# Patient Record
Sex: Female | Born: 1995 | Race: Black or African American | Hispanic: No | Marital: Single | Smoking: Current some day smoker
Health system: Southern US, Community
[De-identification: ages and names within clinical notes are randomized; demographics above are authoritative.]

## PROBLEM LIST (undated history)

## (undated) ENCOUNTER — Ambulatory Visit (HOSPITAL_COMMUNITY): Discharge status: Absent without leave | Payer: Medicaid Other

## (undated) DIAGNOSIS — T39395A Adverse effect of other nonsteroidal anti-inflammatory drugs [NSAID], initial encounter: Secondary | ICD-10-CM

## (undated) DIAGNOSIS — R569 Unspecified convulsions: Secondary | ICD-10-CM

## (undated) DIAGNOSIS — F419 Anxiety disorder, unspecified: Secondary | ICD-10-CM

## (undated) DIAGNOSIS — E119 Type 2 diabetes mellitus without complications: Secondary | ICD-10-CM

## (undated) DIAGNOSIS — N83202 Unspecified ovarian cyst, left side: Secondary | ICD-10-CM

## (undated) DIAGNOSIS — N83201 Unspecified ovarian cyst, right side: Secondary | ICD-10-CM

## (undated) DIAGNOSIS — K922 Gastrointestinal hemorrhage, unspecified: Secondary | ICD-10-CM

## (undated) DIAGNOSIS — F445 Conversion disorder with seizures or convulsions: Secondary | ICD-10-CM

## (undated) DIAGNOSIS — I219 Acute myocardial infarction, unspecified: Secondary | ICD-10-CM

## (undated) HISTORY — DX: Anxiety disorder, unspecified: F41.9

## (undated) HISTORY — PX: HEMORROIDECTOMY: SUR656

## (undated) NOTE — Telephone Encounter (Signed)
 Formatting of this note might be different from the original. Opened in error.  Electronically signed by Kaylee Dress, RN at 08/24/2021  2:54 PM EST

---

## 2014-06-09 ENCOUNTER — Emergency Department (HOSPITAL_COMMUNITY)
Admission: EM | Admit: 2014-06-09 | Discharge: 2014-06-10 | Disposition: A | Discharge status: Home / Self Care | Payer: Medicaid Other | Attending: Emergency Medicine | Admitting: Emergency Medicine

## 2014-06-09 ENCOUNTER — Encounter (HOSPITAL_COMMUNITY): Payer: Self-pay | Admitting: Emergency Medicine

## 2014-06-09 DIAGNOSIS — R55 Syncope and collapse: Secondary | ICD-10-CM | POA: Diagnosis not present

## 2014-06-09 DIAGNOSIS — Z79899 Other long term (current) drug therapy: Secondary | ICD-10-CM | POA: Insufficient documentation

## 2014-06-09 DIAGNOSIS — N730 Acute parametritis and pelvic cellulitis: Secondary | ICD-10-CM

## 2014-06-09 DIAGNOSIS — Z3202 Encounter for pregnancy test, result negative: Secondary | ICD-10-CM | POA: Insufficient documentation

## 2014-06-09 DIAGNOSIS — F17200 Nicotine dependence, unspecified, uncomplicated: Secondary | ICD-10-CM | POA: Diagnosis not present

## 2014-06-09 DIAGNOSIS — R109 Unspecified abdominal pain: Secondary | ICD-10-CM | POA: Diagnosis present

## 2014-06-09 DIAGNOSIS — R110 Nausea: Secondary | ICD-10-CM

## 2014-06-09 LAB — CBC WITH DIFFERENTIAL/PLATELET
Basophils Absolute: 0 10*3/uL (ref 0.0–0.1)
Basophils Relative: 0 % (ref 0–1)
Eosinophils Absolute: 0.1 10*3/uL (ref 0.0–0.7)
Eosinophils Relative: 2 % (ref 0–5)
HEMATOCRIT: 39.3 % (ref 36.0–46.0)
Hemoglobin: 12.2 g/dL (ref 12.0–15.0)
LYMPHS PCT: 41 % (ref 12–46)
Lymphs Abs: 3.3 10*3/uL (ref 0.7–4.0)
MCH: 22.7 pg — ABNORMAL LOW (ref 26.0–34.0)
MCHC: 31 g/dL (ref 30.0–36.0)
MCV: 73 fL — AB (ref 78.0–100.0)
Monocytes Absolute: 0.9 10*3/uL (ref 0.1–1.0)
Monocytes Relative: 11 % (ref 3–12)
NEUTROS ABS: 3.7 10*3/uL (ref 1.7–7.7)
Neutrophils Relative %: 46 % (ref 43–77)
Platelets: 311 10*3/uL (ref 150–400)
RBC: 5.38 MIL/uL — ABNORMAL HIGH (ref 3.87–5.11)
RDW: 15 % (ref 11.5–15.5)
WBC: 8 10*3/uL (ref 4.0–10.5)

## 2014-06-09 NOTE — ED Provider Notes (Signed)
CSN: [id number]     Arrival date & time 06/09/14  2143 History   First MD Initiated Contact with Patient 06/09/14 2226     Chief Complaint  Patient presents with  . Abdominal Pain  . Back Pain     (Consider location/radiation/quality/duration/timing/severity/associated sxs/prior Treatment) HPI Ashlee Thompson is a 18 y.o. female with no significant past medical history who comes in for evaluation of abdominal pain. Patient says for the past week she has had abdominal pain associated with nausea with no vomiting. She reports Advil the pain better and moving makes the pain worse. She characterizes the pain as "someone stabbing me with a screwdriver" and localizes it to her lower abdomen. She also reports she started her period today he reports this is similar to the pain she has had with menses before. No abdominal surgeries.  She denies being pregnant. She also reports passing out twice today once while standing in her dorm room and again while sitting in the chair shortly after she experienced her first episode. She denies any head trauma, no aura, no seizure-like activity. She went to the [facility] health care and they told her she was dehydrated. She reports diminished appetite. She denies fevers, chest pain, shortness of breath, numbness or weakness.  History reviewed. No pertinent past medical history. History reviewed. No pertinent past surgical history. Family History  Problem Relation Age of Onset  . CAD Other   . Cancer Other    History  Substance Use Topics  . Smoking status: Current Every Day Smoker    Types: Cigarettes  . Smokeless tobacco: Not on file  . Alcohol Use: Yes     Comment: rare   OB History   Grav Para Term Preterm Abortions TAB SAB Ect Mult Living                 Review of Systems  Constitutional: Negative for fever.  HENT: Negative for sore throat.   Eyes: Negative for visual disturbance.  Respiratory: Negative for shortness of breath.   Cardiovascular:  Negative for chest pain.  Gastrointestinal: Positive for nausea and abdominal pain.  Endocrine: Negative for polyuria.  Genitourinary: Negative for dysuria.  Skin: Negative for rash.  Neurological: Positive for syncope. Negative for headaches.      Allergies  Review of patient's allergies indicates no known allergies.  Home Medications   Prior to Admission medications   Medication Sig Start Date End Date Taking? Authorizing Provider  norethindrone-ethinyl estradiol-iron (ESTROSTEP FE,TILIA FE,TRI-LEGEST FE) 1-20/1-30/1-35 MG-MCG tablet Take 1 tablet by mouth daily.   Yes Historical Provider, MD  doxycycline (VIBRAMYCIN) 100 MG capsule Take 1 capsule (100 mg total) by mouth 2 (two) times daily. One po bid x 7 days 06/10/14   Earle Gell Keyly Baldonado, PA-C  ketorolac (TORADOL) 10 MG tablet Take 1 tablet (10 mg total) by mouth every 6 (six) hours as needed. 06/10/14   Sharlene Motts, PA-C  ondansetron (ZOFRAN ODT) 4 MG disintegrating tablet  ODT q4 hours prn nausea/vomit 06/10/14   Earle Gell Quana Chamberlain, PA-C   BP 142/91  Pulse 95  Temp(Src) 98.2 F (36.8 C) (Oral)  Resp 19  Ht  (1.651 m)  Wt 220 lb (99.791 kg)  BMI 36.61 kg/m2  SpO2 98%  LMP 06/09/2014 Physical Exam  Nursing note and vitals reviewed. Constitutional: She is oriented to person, place, and time. She appears well-developed and well-nourished.  HENT:  Head: Normocephalic and atraumatic.  Mouth/Throat: Oropharynx is clear and moist.  Eyes: Conjunctivae  are normal. Pupils are equal, round, and reactive to light. Right eye exhibits no discharge. Left eye exhibits no discharge. No scleral icterus.  Neck: Neck supple. No JVD present.  Cardiovascular: Normal rate, regular rhythm and normal heart sounds.   Pulmonary/Chest: Effort normal and breath sounds normal. No respiratory distress. She has no wheezes. She has no rales.  Abdominal: Soft. There is tenderness.  Diffuse tenderness to palpation of Lower abdomen. No  obvious lesions, rashes or masses.  Genitourinary:  Chaperone was present for the entire genital exam. No lesions or rashes appreciated on vulva. Cervix visualized on speculum exam and appropriate cultures sampled. Copious blood in vaginal vault.  Discharge not identifiable Upon bi manual exam-  TTP of the L adnexa with cervical motion tenderness. No fullness or masses appreciated. No abnormalities appreciated in structural anatomy.   Musculoskeletal: She exhibits no tenderness.  Neurological: She is alert and oriented to person, place, and time.  Cranial Nerves II-XII grossly intact  Skin: Skin is warm and dry. No rash noted.  Psychiatric: She has a normal mood and affect.    ED Course  Procedures (including critical care time) Labs Review Labs Reviewed  WET PREP, GENITAL - Abnormal; Notable for the following:    WBC, Wet Prep HPF POC FEW (*)    All other components within normal limits  CBC WITH DIFFERENTIAL - Abnormal; Notable for the following:    RBC 5.38 (*)    MCV 73.0 (*)    MCH 22.7 (*)    All other components within normal limits  COMPREHENSIVE METABOLIC PANEL - Abnormal; Notable for the following:    Total Bilirubin 0.2 (*)    All other components within normal limits  URINALYSIS, ROUTINE W REFLEX MICROSCOPIC - Abnormal; Notable for the following:    Hgb urine dipstick TRACE (*)    All other components within normal limits  URINE MICROSCOPIC-ADD ON - Abnormal; Notable for the following:    Squamous Epithelial / LPF FEW (*)    All other components within normal limits  GC/CHLAMYDIA PROBE AMP  PREGNANCY, URINE  RPR  HIV ANTIBODY (ROUTINE TESTING)    Imaging Review No results found.   EKG Interpretation   Date/Time:  Thursday June 10 2014 00:10:31 EDT Ventricular Rate:  91 PR Interval:  139 QRS Duration: 88 QT Interval:  372 QTC Calculation: 458 R Axis:   20 Text Interpretation:  Normal sinus rhythm Normal ECG No old tracing to  compare Confirmed  by GOLDSTON  MD, SCOTT ([id number]) on 06/10/2014 12:15:42 AM     Meds given in ED:  Medications  cefTRIAXone (ROCEPHIN) injection 250 mg (not administered)  ondansetron (ZOFRAN-ODT) disintegrating tablet 4 mg (4 mg Oral Given 06/10/14 0105)  ketorolac (TORADOL) 30 MG/ML injection 30 mg (30 mg Intramuscular Given 06/10/14 0110)    New Prescriptions   DOXYCYCLINE (VIBRAMYCIN) 100 MG CAPSULE    Take 1 capsule (100 mg total) by mouth 2 (two) times daily. One po bid x 7 days   KETOROLAC (TORADOL) 10 MG TABLET    Take 1 tablet (10 mg total) by mouth every 6 (six) hours as needed.   ONDANSETRON (ZOFRAN ODT) 4 MG DISINTEGRATING TABLET     ODT q4 hours prn nausea/vomit   Filed Vitals:   06/09/14 2209  BP: 142/91  Pulse: 95  Temp: 98.2 F (36.8 C)  TempSrc: Oral  Resp: 19  Height:  (1.651 m)  Weight: 220 lb (99.791 kg)  SpO2: 98%    MDM  Vitals stable - WNL -afebrile Pt resting comfortably in ED. PE and clinical picture not concerning for emergent pathology at this time. Due to unidentifiable cause for abd pain and presence of cervical motion tenderness, will trxt for PID.  Orthostatic vitals not concerning for autonomic dysfunction. EKG not concerning. Labwork noncontributory. No evidence of UTI. Trace Hgb likely due to menses. Pt stable, in good condition and is appropriate for discharge at this time.  Will DC with Doxycycline for PID, Zofran for Nausea and Toradol for abd pain. Discussed f/u with PCP and return precautions, pt very amenable to plan.  Prior to patient discharge, I discussed and reviewed this case with Dr.Iva Lynelle Doctor    Final diagnoses:  PID (acute pelvic inflammatory disease)  Nausea  Abdominal discomfort        Earle Gell Darlington, PA-C 06/10/14 [phone]

## 2014-06-09 NOTE — ED Notes (Signed)
Pt states she started having lower abd pain that started 2 days ago  Pt states the pain has progressively gotten worse and now she hurts in her lower back as well  Pt states she is also having headaches  Pt states today she has passed out twice  Pt denies any urinary symptoms, states her bowel movements are normal last one was this morning,  Pt states she is currently on her period started today

## 2014-06-09 NOTE — ED Notes (Signed)
PA at bedside.

## 2014-06-10 LAB — COMPREHENSIVE METABOLIC PANEL
ALT: 35 U/L (ref 0–35)
ANION GAP: 14 (ref 5–15)
AST: 32 U/L (ref 0–37)
Albumin: 3.5 g/dL (ref 3.5–5.2)
Alkaline Phosphatase: 88 U/L (ref 39–117)
BILIRUBIN TOTAL: 0.2 mg/dL — AB (ref 0.3–1.2)
BUN: 17 mg/dL (ref 6–23)
CHLORIDE: 101 meq/L (ref 96–112)
CO2: 23 meq/L (ref 19–32)
CREATININE: 0.8 mg/dL (ref 0.50–1.10)
Calcium: 9.5 mg/dL (ref 8.4–10.5)
GLUCOSE: 98 mg/dL (ref 70–99)
Potassium: 4.2 mEq/L (ref 3.7–5.3)
Sodium: 138 mEq/L (ref 137–147)
Total Protein: 7.8 g/dL (ref 6.0–8.3)

## 2014-06-10 LAB — GC/CHLAMYDIA PROBE AMP
CT Probe RNA: NEGATIVE
GC PROBE AMP APTIMA: NEGATIVE

## 2014-06-10 LAB — URINALYSIS, ROUTINE W REFLEX MICROSCOPIC
Bilirubin Urine: NEGATIVE
Glucose, UA: NEGATIVE mg/dL
KETONES UR: NEGATIVE mg/dL
LEUKOCYTES UA: NEGATIVE
Nitrite: NEGATIVE
PROTEIN: NEGATIVE mg/dL
Specific Gravity, Urine: 1.025 (ref 1.005–1.030)
UROBILINOGEN UA: 0.2 mg/dL (ref 0.0–1.0)
pH: 6 (ref 5.0–8.0)

## 2014-06-10 LAB — HIV ANTIBODY (ROUTINE TESTING W REFLEX): HIV 1&2 Ab, 4th Generation: NONREACTIVE

## 2014-06-10 LAB — URINE MICROSCOPIC-ADD ON

## 2014-06-10 LAB — WET PREP, GENITAL
CLUE CELLS WET PREP: NONE SEEN
Trich, Wet Prep: NONE SEEN
Yeast Wet Prep HPF POC: NONE SEEN

## 2014-06-10 LAB — PREGNANCY, URINE: Preg Test, Ur: NEGATIVE

## 2014-06-10 LAB — RPR

## 2014-06-10 MED ORDER — ONDANSETRON 4 MG PO TBDP: ORAL_TABLET | ORAL | Status: DC

## 2014-06-10 MED ORDER — CEFTRIAXONE SODIUM 250 MG IJ SOLR
250.0000 mg | Freq: Once | INTRAMUSCULAR | Status: AC
  Administered 2014-06-10: 250 mg via INTRAMUSCULAR
  Filled 2014-06-10: qty 250

## 2014-06-10 MED ORDER — KETOROLAC TROMETHAMINE 10 MG PO TABS: 10.0000 mg | ORAL_TABLET | Freq: Four times a day (QID) | ORAL | Status: DC | PRN

## 2014-06-10 MED ORDER — ONDANSETRON 4 MG PO TBDP
4.0000 mg | ORAL_TABLET | Freq: Once | ORAL | Status: AC
  Administered 2014-06-10: 4 mg via ORAL
  Filled 2014-06-10: qty 1

## 2014-06-10 MED ORDER — DOXYCYCLINE HYCLATE 100 MG PO CAPS: 100.0000 mg | ORAL_CAPSULE | Freq: Two times a day (BID) | ORAL | Status: DC

## 2014-06-10 MED ORDER — KETOROLAC TROMETHAMINE 30 MG/ML IJ SOLN
30.0000 mg | Freq: Once | INTRAMUSCULAR | Status: DC
  Filled 2014-06-10: qty 1

## 2014-06-10 MED ORDER — KETOROLAC TROMETHAMINE 30 MG/ML IJ SOLN
30.0000 mg | Freq: Once | INTRAMUSCULAR | Status: AC
  Administered 2014-06-10: 30 mg via INTRAMUSCULAR

## 2014-06-11 NOTE — ED Provider Notes (Signed)
Medical screening examination/treatment/procedure(s) were performed by non-physician practitioner and as supervising physician I was immediately available for consultation/collaboration.   EKG Interpretation   Date/Time:  Thursday June 10 2014 00:10:31 EDT Ventricular Rate:  91 PR Interval:  139 QRS Duration: 88 QT Interval:  372 QTC Calculation: 458 R Axis:   20 Text Interpretation:  Normal sinus rhythm Normal ECG No old tracing to  compare Confirmed by GOLDSTON  MD, SCOTT [id number]) on 06/10/2014 12:15:42 AM      Devoria Albe, MD, Franz Dell, MD 06/11/14 0030

## 2014-12-15 ENCOUNTER — Encounter (HOSPITAL_COMMUNITY): Payer: Self-pay | Admitting: Emergency Medicine

## 2014-12-15 ENCOUNTER — Emergency Department (HOSPITAL_COMMUNITY): Payer: Medicaid Other

## 2014-12-15 ENCOUNTER — Emergency Department (HOSPITAL_COMMUNITY)
Admission: EM | Admit: 2014-12-15 | Discharge: 2014-12-16 | Disposition: A | Discharge status: Home / Self Care | Payer: Medicaid Other | Attending: Emergency Medicine | Admitting: Emergency Medicine

## 2014-12-15 DIAGNOSIS — M7918 Myalgia, other site: Secondary | ICD-10-CM

## 2014-12-15 DIAGNOSIS — F419 Anxiety disorder, unspecified: Secondary | ICD-10-CM | POA: Insufficient documentation

## 2014-12-15 DIAGNOSIS — M791 Myalgia: Secondary | ICD-10-CM | POA: Insufficient documentation

## 2014-12-15 DIAGNOSIS — R51 Headache: Secondary | ICD-10-CM | POA: Diagnosis not present

## 2014-12-15 DIAGNOSIS — Z720 Tobacco use: Secondary | ICD-10-CM | POA: Diagnosis not present

## 2014-12-15 DIAGNOSIS — R0789 Other chest pain: Secondary | ICD-10-CM | POA: Diagnosis not present

## 2014-12-15 DIAGNOSIS — M25512 Pain in left shoulder: Secondary | ICD-10-CM | POA: Insufficient documentation

## 2014-12-15 DIAGNOSIS — R079 Chest pain, unspecified: Secondary | ICD-10-CM

## 2014-12-15 DIAGNOSIS — M79602 Pain in left arm: Secondary | ICD-10-CM

## 2014-12-15 DIAGNOSIS — M62838 Other muscle spasm: Secondary | ICD-10-CM

## 2014-12-15 DIAGNOSIS — F411 Generalized anxiety disorder: Secondary | ICD-10-CM

## 2014-12-15 LAB — CBC WITH DIFFERENTIAL/PLATELET
Basophils Absolute: 0 10*3/uL (ref 0.0–0.1)
Basophils Relative: 0 % (ref 0–1)
EOS PCT: 1 % (ref 0–5)
Eosinophils Absolute: 0.1 10*3/uL (ref 0.0–0.7)
HCT: 39.3 % (ref 36.0–46.0)
Hemoglobin: 12.2 g/dL (ref 12.0–15.0)
LYMPHS ABS: 3.5 10*3/uL (ref 0.7–4.0)
Lymphocytes Relative: 34 % (ref 12–46)
MCH: 22.6 pg — ABNORMAL LOW (ref 26.0–34.0)
MCHC: 31 g/dL (ref 30.0–36.0)
MCV: 72.9 fL — AB (ref 78.0–100.0)
MONO ABS: 0.8 10*3/uL (ref 0.1–1.0)
Monocytes Relative: 8 % (ref 3–12)
Neutro Abs: 5.7 10*3/uL (ref 1.7–7.7)
Neutrophils Relative %: 57 % (ref 43–77)
Platelets: 292 10*3/uL (ref 150–400)
RBC: 5.39 MIL/uL — AB (ref 3.87–5.11)
RDW: 15.2 % (ref 11.5–15.5)
WBC: 10.1 10*3/uL (ref 4.0–10.5)

## 2014-12-15 LAB — BASIC METABOLIC PANEL
ANION GAP: 9 (ref 5–15)
BUN: 20 mg/dL (ref 6–23)
CALCIUM: 9.4 mg/dL (ref 8.4–10.5)
CO2: 25 mmol/L (ref 19–32)
CREATININE: 0.58 mg/dL (ref 0.50–1.10)
Chloride: 103 mmol/L (ref 96–112)
GFR calc Af Amer: >90 mL/min (ref >90)
GFR calc non Af Amer: >90 mL/min (ref >90)
Glucose, Bld: 91 mg/dL (ref 70–99)
Potassium: 3.7 mmol/L (ref 3.5–5.1)
Sodium: 137 mmol/L (ref 135–145)

## 2014-12-15 LAB — I-STAT TROPONIN, ED: TROPONIN I, POC: 0 ng/mL (ref 0.00–0.08)

## 2014-12-15 MED ORDER — ASPIRIN 325 MG PO TABS
325.0000 mg | ORAL_TABLET | Freq: Once | ORAL | Status: AC
Start: 2014-12-15 — End: 2014-12-15
  Administered 2014-12-15: 325 mg via ORAL
  Filled 2014-12-15: qty 1

## 2014-12-15 MED ORDER — LORAZEPAM 2 MG/ML IJ SOLN
1.0000 mg | Freq: Once | INTRAMUSCULAR | Status: AC
  Administered 2014-12-15: 1 mg via INTRAVENOUS
  Filled 2014-12-15: qty 1

## 2014-12-15 NOTE — ED Notes (Signed)
Bed: [id number] Expected date:  Expected time:  Means of arrival:  Comments: EMS 19 yo female with chest pain and hyperventilating

## 2014-12-15 NOTE — ED Notes (Signed)
Pt c/o panic attack while at work. Pt states she got birth control shot and is having left arm pain.

## 2014-12-15 NOTE — ED Provider Notes (Signed)
CSN: [id number]     Arrival date & time 12/15/14  1955 History   First MD Initiated Contact with Patient 12/15/14 2117     Chief Complaint  Patient presents with  . Panic Attack  . Arm Pain     (Consider location/radiation/quality/duration/timing/severity/associated sxs/prior Treatment) HPI Comments: Ashlee Thompson is a 19 y.o. female who presents to the ED with complaints of panic attack and left arm pain that began around 6:30 PM while she was at work at rest. She reports that yesterday she had a shot in her arm, and that today at approximately 6:30 she developed 10/10 central chest tightness that seems to be radiating from the left shoulder and neck area, coming intermittently, worse with movement, with no medications tried prior to arrival. She states this feels very much like her prior anxiety attacks. She reports that she has some left arm tingling but denies any numbness or weakness, states that it hurts to move the arm therefore she refuses to move it but denies that it is weak. States she did initially feel short of breath when she was anxious and hyperventilating, but now she feels improved. She denies any fevers, cough, wheezing, hemoptysis, diaphoresis, lightheadedness, syncope, dizziness, leg swelling, recent travel/surgery/immobilization, OCP use, dysphagia, claudication, PND, orthopnea, heartburn, abdominal pain, nausea, vomiting, dysuria, hematuria, vaginal bleeding or discharge, numbness, weakness, rashes, neck stiffness, or vision changes. She endorses that she has a migraine headache which is the same as her typical migraines and has developed after she had a panic attack.  Patient is a 19 y.o. female presenting with arm pain. The history is provided by the patient. No language interpreter was used.  Arm Pain This is a new problem. The current episode started today. The problem occurs constantly. The problem has been unchanged. Associated symptoms include chest pain (from L  shoulder), headaches (usual migraine), myalgias (L shoulder/arm) and neck pain (from L shoulder). Pertinent negatives include no abdominal pain, arthralgias, chills, coughing, diaphoresis, fever, joint swelling, nausea, numbness, rash, visual change, vomiting or weakness. Exacerbated by: movement. She has tried nothing for the symptoms. The treatment provided no relief.    History reviewed. No pertinent past medical history. History reviewed. No pertinent past surgical history. Family History  Problem Relation Age of Onset  . CAD Other   . Cancer Other    History  Substance Use Topics  . Smoking status: Current Every Day Smoker    Types: Cigarettes  . Smokeless tobacco: Not on file  . Alcohol Use: Yes     Comment: rare   OB History    No data available     Review of Systems  Constitutional: Negative for fever, chills and diaphoresis.  Eyes: Negative for visual disturbance.  Respiratory: Positive for shortness of breath (now resolved). Negative for cough and wheezing.   Cardiovascular: Positive for chest pain (from L shoulder).  Gastrointestinal: Negative for nausea, vomiting, abdominal pain, diarrhea and constipation.  Genitourinary: Negative for dysuria, hematuria, flank pain, vaginal bleeding and vaginal discharge.  Musculoskeletal: Positive for myalgias (L shoulder/arm) and neck pain (from L shoulder). Negative for back pain, joint swelling, arthralgias and neck stiffness.  Skin: Negative for color change and rash.  Allergic/Immunologic: Negative for immunocompromised state.  Neurological: Positive for headaches (usual migraine). Negative for dizziness, syncope, weakness, light-headedness and numbness.  Psychiatric/Behavioral: Negative for confusion. The patient is nervous/anxious (anxiety attack).    10 Systems reviewed and are negative for acute change except as noted in the HPI.  Allergies  Review of patient's allergies indicates no known allergies.  Home Medications     Prior to Admission medications   Medication Sig Start Date End Date Taking? Authorizing Provider  MedroxyPROGESTERone Acetate (DEPO-PROVERA IM) Inject into the muscle every 3 (three) months.   Yes Historical Provider, MD  ondansetron (ZOFRAN ODT) 4 MG disintegrating tablet  ODT q4 hours prn nausea/vomit 06/10/14  Yes Joycie Peek, PA-C  doxycycline (VIBRAMYCIN) 100 MG capsule Take 1 capsule (100 mg total) by mouth 2 (two) times daily. One po bid x 7 days Patient not taking: Reported on 12/15/2014 06/10/14   Joycie Peek, PA-C  ketorolac (TORADOL) 10 MG tablet Take 1 tablet (10 mg total) by mouth every 6 (six) hours as needed. Patient not taking: Reported on 12/15/2014 06/10/14   Joycie Peek, PA-C   BP 119/77 mmHg  Pulse 96  Temp(Src) 99 F (37.2 C) (Rectal)  Resp 12  SpO2 100% Physical Exam  Constitutional: She is oriented to person, place, and time. Vital signs are normal. She appears well-developed and well-nourished.  Non-toxic appearance. No distress.  Afebrile, nontoxic, laying in bed calmly but gets distressed when asked to move, hyperventilates, but then calms down when coached  HENT:  Head: Normocephalic and atraumatic.  Mouth/Throat: Oropharynx is clear and moist and mucous membranes are normal.  Eyes: Conjunctivae and EOM are normal. Pupils are equal, round, and reactive to light. Right eye exhibits no discharge. Left eye exhibits no discharge.  PERRL, EOMI  Neck: Normal range of motion. Neck supple. No JVD present.  No meningismus or JVD  Cardiovascular: Normal rate, regular rhythm, normal heart sounds and intact distal pulses.  Exam reveals no gallop and no friction rub.   No murmur heard. RRR, nl s1/s2, no m/r/g, distal pulses intact, no pedal edema   Pulmonary/Chest: Effort normal and breath sounds normal. No respiratory distress. She has no decreased breath sounds. She has no wheezes. She has no rhonchi. She has no rales. She exhibits tenderness. She exhibits  no crepitus, no deformity and no retraction.    CTAB in all lung fields, no w/r/r, no hypoxia or increased WOB, speaking in full sentences, SpO2 100% on RA Chest wall TTP diffusely over L side extending into L deltoid/arm, no crepitus/deformity/retraction  Abdominal: Soft. Normal appearance and bowel sounds are normal. She exhibits no distension. There is no tenderness. There is no rigidity, no rebound, no guarding, no CVA tenderness, no tenderness at McBurney's point and negative Murphy's sign.  Musculoskeletal: Normal range of motion.       Left shoulder: She exhibits tenderness and spasm. She exhibits normal range of motion, no bony tenderness, no swelling, no effusion, no crepitus, no deformity, normal pulse and normal strength.       Left upper arm: She exhibits tenderness. She exhibits no bony tenderness, no swelling, no edema and no deformity.       Arms: L shoulder with diffuse TTP in deltoid muscle, mild spasm, no swelling/effusion, no crepitus or deformity, no focal bony TTP, FROM intact, strength and sensation grossly intact, distal pulses intact. Pt refuses to cooperate with full shoulder exam. No erythema or warmth.  Neurological: She is alert and oriented to person, place, and time. She has normal strength. No cranial nerve deficit or sensory deficit. GCS eye subscore is 4. GCS verbal subscore is 5. GCS motor subscore is 6.  CN 2-12 grossly intact A&O x4 GCS 15 Sensation and strength intact  Skin: Skin is warm, dry and intact. No rash  noted.  Psychiatric: Her mood appears anxious.  Anxious when asked to move L arm, but calms down when encouraged to take deep breaths  Nursing note and vitals reviewed.   ED Course  Procedures (including critical care time) Labs Review Labs Reviewed  CBC WITH DIFFERENTIAL/PLATELET - Abnormal; Notable for the following:    RBC 5.39 (*)    MCV 72.9 (*)    MCH 22.6 (*)    All other components within normal limits  BASIC METABOLIC PANEL  I-STAT  TROPOININ, ED  I-STAT TROPOININ, ED    Imaging Review Dg Chest 2 View  12/15/2014   CLINICAL DATA:  Acute onset of left-sided chest pain, shortness of breath, productive cough, headache and nasal congestion. Initial encounter.  EXAM: CHEST  2 VIEW  COMPARISON:  None.  FINDINGS: The lungs are well-aerated and clear. There is no evidence of focal opacification, pleural effusion or pneumothorax.  The heart is normal in size; the mediastinal contour is within normal limits. No acute osseous abnormalities are seen.  IMPRESSION: No acute cardiopulmonary process seen.   Electronically Signed   By: Roanna Raider M.D.   On: 12/15/2014 22:17     EKG Interpretation   Date/Time:  Wednesday December 15 2014 20:19:36 EDT Ventricular Rate:  97 PR Interval:  145 QRS Duration: 89 QT Interval:  341 QTC Calculation: 433 R Axis:   41 Text Interpretation:  Sinus rhythm No significant change was found  Confirmed by [facility]  MD, TREY ([id number]) on 12/15/2014 11:50:31 PM      MDM   Final diagnoses:  Chest pain  Atypical chest pain  Pain of left deltoid  Musculoskeletal arm pain, left  Musculoskeletal chest pain  Anxiety state  Muscle spasm of left shoulder area    19 y.o. female here with anxiety/panic attack that occurred at work. States this is the same as her prior panic attacks. States she has CP intermittently but upon questioning seems that it's actually L shoulder pain from her injection yesterday that radiates into her precordial area. All pain is reproducible. Sensation and strength intact in all extremities, distal pulses equal in all extremities. Doubt ACS and pt with few risk factors, but will give ASA which may help with musculoskeletal pain, and obtain labs/EKG/CXR. Will also give ativan as I believe this is largely due to anxiety. VS stable upon arrival and at this time. Will reassess after.   11:51 PM Trop neg at 4hr mark. CBC w/diff unremarkabel. BMP unremarkable. CXR WNL. EKG unremarkable.  Pain improved after ASA and ativan, moving arm more freely. Likely musculoskeletal etiology, but will get repeat trop at 6hr mark from onset of symptoms, and then likely discharge home with small supply of valium and naprosyn. Discussed f/up with campus clinic in 3-4 days to ensure resolution of symptoms.   1:14 AM Lab draw has not yet been done, asked 4 times for this to be obtained. Care signed over to Earley Favor NP who will follow up with repeat trop, as long as it's neg she will be discharged home with previously discussed plan.   BP 119/77 mmHg  Pulse 96  Temp(Src) 99 F (37.2 C) (Rectal)  Resp 12  SpO2 100%  Meds ordered this encounter  Medications  . aspirin tablet 325 mg    Sig:   . LORazepam (ATIVAN) injection 1 mg    Sig:   . diazepam (VALIUM) 5 MG tablet    Sig: Take 0.5 tablets (2.5 mg total) by mouth every 6 (  six) hours as needed for anxiety or muscle spasms.    Dispense:  6 tablet    Refill:  0    Order Specific Question:  Supervising Provider    Answer:  MILLER, BRIAN [[id number]]  . naproxen (NAPROSYN) 500 MG tablet    Sig: Take 1 tablet (500 mg total) by mouth 2 (two) times daily as needed for mild pain, moderate pain or headache (TAKE WITH MEALS.).    Dispense:  20 tablet    Refill:  0    Order Specific Question:  Supervising Provider    Answer:  Eber Hong [[id number]]     Travaughn Vue Camprubi-Soms, PA-C 12/16/14 [id number]  Blake Divine, MD 12/16/14 1313

## 2014-12-15 NOTE — ED Notes (Signed)
Pt reports she is unable to move left arm.

## 2014-12-16 LAB — I-STAT TROPONIN, ED: TROPONIN I, POC: 0 ng/mL (ref 0.00–0.08)

## 2014-12-16 MED ORDER — NAPROXEN 500 MG PO TABS: 500.0000 mg | ORAL_TABLET | Freq: Two times a day (BID) | ORAL | Status: DC | PRN

## 2014-12-16 MED ORDER — DIAZEPAM 5 MG PO TABS: 2.5000 mg | ORAL_TABLET | Freq: Four times a day (QID) | ORAL | Status: DC | PRN

## 2014-12-16 NOTE — Discharge Instructions (Signed)
Your pain is likely from the injection you received in your arm. Take naprosyn as directed for inflammation and pain with tylenol for breakthrough pain and valium for muscle relaxation and anxiety. Do not drive or operate machinery or consume alcohol with valium use. Use heat over the areas of soreness, no more than 20 minutes at a time every hour. Stay well hydrated. Follow up with your campus clinic in 3-4 days for recheck of ongoing symptoms. Return to ER for emergent changing or worsening of symptoms.     Musculoskeletal Pain Musculoskeletal pain is muscle and boney aches and pains. These pains can occur in any part of the body. Your caregiver may treat you without knowing the cause of the pain. They may treat you if blood or urine tests, X-rays, and other tests were normal.  CAUSES There is often not a definite cause or reason for these pains. These pains may be caused by a type of germ (virus). The discomfort may also come from overuse. Overuse includes working out too hard when your body is not fit. Boney aches also come from weather changes. Bone is sensitive to atmospheric pressure changes. HOME CARE INSTRUCTIONS   Ask when your test results will be ready. Make sure you get your test results.  Only take over-the-counter or prescription medicines for pain, discomfort, or fever as directed by your caregiver. If you were given medications for your condition, do not drive, operate machinery or power tools, or sign legal documents for 24 hours. Do not drink alcohol. Do not take sleeping pills or other medications that may interfere with treatment.  Continue all activities unless the activities cause more pain. When the pain lessens, slowly resume normal activities. Gradually increase the intensity and duration of the activities or exercise.  During periods of severe pain, bed rest may be helpful. Lay or sit in any position that is comfortable.  Putting ice on the injured area.  Put ice in a  bag.  Place a towel between your skin and the bag.  Leave the ice on for 15 to 20 minutes, 3 to 4 times a day.  Follow up with your caregiver for continued problems and no reason can be found for the pain. If the pain becomes worse or does not go away, it may be necessary to repeat tests or do additional testing. Your caregiver may need to look further for a possible cause. SEEK IMMEDIATE MEDICAL CARE IF:  You have pain that is getting worse and is not relieved by medications.  You develop chest pain that is associated with shortness or breath, sweating, feeling sick to your stomach (nauseous), or throw up (vomit).  Your pain becomes localized to the abdomen.  You develop any new symptoms that seem different or that concern you. MAKE SURE YOU:   Understand these instructions.  Will watch your condition.  Will get help right away if you are not doing well or get worse. Document Released: 09/03/2005 Document Revised: 11/26/2011 Document Reviewed: 05/08/2013 [facility] Patient Information 2015 [place], [place]. This information is not intended to replace advice given to you by your health care provider. Make sure you discuss any questions you have with your health care provider.  Chest Wall Pain Chest wall pain is pain felt in or around the chest bones and muscles. It may take up to 6 weeks to get better. It may take longer if you are active. Chest wall pain can happen on its own. Other times, things like germs, injury, coughing,  or exercise can cause the pain. HOME CARE   Avoid activities that make you tired or cause pain. Try not to use your chest, belly (abdominal), or side muscles. Do not use heavy weights.  Put ice on the sore area.  Put ice in a plastic bag.  Place a towel between your skin and the bag.  Leave the ice on for 15-20 minutes for the first 2 days.  Only take medicine as told by your doctor. GET HELP RIGHT AWAY IF:   You have more pain or are very  uncomfortable.  You have a fever.  Your chest pain gets worse.  You have new problems.  You feel sick to your stomach (nauseous) or throw up (vomit).  You start to sweat or feel lightheaded.  You have a cough with mucus (phlegm).  You cough up blood. MAKE SURE YOU:   Understand these instructions.  Will watch your condition.  Will get help right away if you are not doing well or get worse. Document Released: 02/20/2008 Document Revised: 11/26/2011 Document Reviewed: 04/30/2011 [facility] Patient Information 2015 [place]. This information is not intended to replace advice given to you by your health care provider. Make sure you discuss any questions you have with your health care provider.  Muscle Cramps and Spasms Muscle cramps and spasms occur when a muscle or muscles tighten and you have no control over this tightening (involuntary muscle contraction). They are a common problem and can develop in any muscle. The most common place is in the calf muscles of the leg. Both muscle cramps and muscle spasms are involuntary muscle contractions, but they also have differences:   Muscle cramps are sporadic and painful. They may last a few seconds to a quarter of an hour. Muscle cramps are often more forceful and last longer than muscle spasms.  Muscle spasms may or may not be painful. They may also last just a few seconds or much longer. CAUSES  It is uncommon for cramps or spasms to be due to a serious underlying problem. In many cases, the cause of cramps or spasms is unknown. Some common causes are:   Overexertion.   Overuse from repetitive motions (doing the same thing over and over).   Remaining in a certain position for a long period of time.   Improper preparation, form, or technique while performing a sport or activity.   Dehydration.   Injury.   Side effects of some medicines.   Abnormally low levels of the salts and ions in your blood (electrolytes),  especially potassium and calcium. This could happen if you are taking water pills (diuretics) or you are pregnant.  Some underlying medical problems can make it more likely to develop cramps or spasms. These include, but are not limited to:   Diabetes.   Parkinson disease.   Hormone disorders, such as thyroid problems.   Alcohol abuse.   Diseases specific to muscles, joints, and bones.   Blood vessel disease where not enough blood is getting to the muscles.  HOME CARE INSTRUCTIONS   Stay well hydrated. Drink enough water and fluids to keep your urine clear or pale yellow.  It may be helpful to massage, stretch, and relax the affected muscle.  For tight or tense muscles, use a warm towel, heating pad, or hot shower water directed to the affected area.  If you are sore or have pain after a cramp or spasm, applying ice to the affected area may relieve discomfort.  Put ice  in a plastic bag.  Place a towel between your skin and the bag.  Leave the ice on for 15-20 minutes, 03-04 times a day.  Medicines used to treat a known cause of cramps or spasms may help reduce their frequency or severity. Only take over-the-counter or prescription medicines as directed by your caregiver. SEEK MEDICAL CARE IF:  Your cramps or spasms get more severe, more frequent, or do not improve over time.  MAKE SURE YOU:   Understand these instructions.  Will watch your condition.  Will get help right away if you are not doing well or get worse. Document Released: 02/23/2002 Document Revised: 12/29/2012 Document Reviewed: 08/20/2012 [facility] Patient Information 2015 [place], [place]. This information is not intended to replace advice given to you by your health care provider. Make sure you discuss any questions you have with your health care provider.  Heat Therapy Heat therapy can help make painful, stiff muscles and joints feel better. Do not use heat on new injuries. Wait at least 48 hours  after an injury to use heat. Do not use heat when you have aches or pains right after an activity. If you still have pain 3 hours after stopping the activity, then you may use heat. HOME CARE Wet heat pack  Soak a clean towel in warm water. Squeeze out the extra water.  Put the warm, wet towel in a plastic bag.  Place a thin, dry towel between your skin and the bag.  Put the heat pack on the area for 5 minutes, and check your skin. Your skin may be pink, but it should not be red.  Leave the heat pack on the area for 15 to 30 minutes.  Repeat this every 2 to 4 hours while awake. Do not use heat while you are sleeping. Warm water bath  Fill a tub with warm water.  Place the affected body part in the tub.  Soak the area for 20 to 40 minutes.  Repeat as needed. Hot water bottle  Fill the water bottle half full with hot water.  Press out the extra air. Close the cap tightly.  Place a dry towel between your skin and the bottle.  Put the bottle on the area for 5 minutes, and check your skin. Your skin may be pink, but it should not be red.  Leave the bottle on the area for 15 to 30 minutes.  Repeat this every 2 to 4 hours while awake. Electric heating pad  Place a dry towel between your skin and the heating pad.  Set the heating pad on low heat.  Put the heating pad on the area for 10 minutes, and check your skin. Your skin may be pink, but it should not be red.  Leave the heating pad on the area for 20 to 40 minutes.  Repeat this every 2 to 4 hours while awake.  Do not lie on the heating pad.  Do not fall asleep while using the heating pad.  Do not use the heating pad near water. GET HELP RIGHT AWAY IF:  You get blisters or red skin.  Your skin is puffy (swollen), or you lose feeling (numbness) in the affected area.  You have any new problems.  Your problems are getting worse.  You have any questions or concerns. If you have any problems, stop using heat  therapy until you see your doctor. MAKE SURE YOU:  Understand these instructions.  Will watch your condition.  Will get help right  away if you are not doing well or get worse. Document Released: 11/26/2011 Document Reviewed: 10/27/2013 [facility] Patient Information 2015 [place], [place]. This information is not intended to replace advice given to you by your health care provider. Make sure you discuss any questions you have with your health care provider.  Generalized Anxiety Disorder Generalized anxiety disorder (GAD) is a mental disorder. It interferes with life functions, including relationships, work, and school. GAD is different from normal anxiety, which everyone experiences at some point in their lives in response to specific life events and activities. Normal anxiety actually helps [place] prepare for and get through these life events and activities. Normal anxiety goes away after the event or activity is over.  GAD causes anxiety that is not necessarily related to specific events or activities. It also causes excess anxiety in proportion to specific events or activities. The anxiety associated with GAD is also difficult to control. GAD can vary from mild to severe. People with severe GAD can have intense waves of anxiety with physical symptoms (panic attacks).  SYMPTOMS The anxiety and worry associated with GAD are difficult to control. This anxiety and worry are related to many life events and activities and also occur more days than not for 6 months or longer. People with GAD also have three or more of the following symptoms (one or more in children):  Restlessness.   Fatigue.  Difficulty concentrating.   Irritability.  Muscle tension.  Difficulty sleeping or unsatisfying sleep. DIAGNOSIS GAD is diagnosed through an assessment by your health care provider. Your health care provider will ask you questions aboutyour mood,physical symptoms, and events in your life. Your health care  provider may ask you about your medical history and use of alcohol or drugs, including prescription medicines. Your health care provider may also do a physical exam and blood tests. Certain medical conditions and the use of certain substances can cause symptoms similar to those associated with GAD. Your health care provider may refer you to a mental health specialist for further evaluation. TREATMENT The following therapies are usually used to treat GAD:   Medication. Antidepressant medication usually is prescribed for long-term daily control. Antianxiety medicines may be added in severe cases, especially when panic attacks occur.   Talk therapy (psychotherapy). Certain types of talk therapy can be helpful in treating GAD by providing support, education, and guidance. A form of talk therapy called cognitive behavioral therapy can teach you healthy ways to think about and react to daily life events and activities.  Stress managementtechniques. These include yoga, meditation, and exercise and can be very helpful when they are practiced regularly. A mental health specialist can help determine which treatment is best for you. Some people see improvement with one therapy. However, other people require a combination of therapies. Document Released: 12/29/2012 Document Revised: 01/18/2014 Document Reviewed: 12/29/2012 [facility] Patient Information 2015 [place], [place]. This information is not intended to replace advice given to you by your health care provider. Make sure you discuss any questions you have with your health care provider.

## 2015-04-07 ENCOUNTER — Ambulatory Visit (INDEPENDENT_AMBULATORY_CARE_PROVIDER_SITE_OTHER): Payer: BLUE CROSS/BLUE SHIELD

## 2015-04-07 ENCOUNTER — Ambulatory Visit (INDEPENDENT_AMBULATORY_CARE_PROVIDER_SITE_OTHER): Payer: BLUE CROSS/BLUE SHIELD | Admitting: Family Medicine

## 2015-04-07 VITALS — BP 118/74 | HR 107 | Temp 98.3°F | Resp 16 | Ht 65.5 in | Wt 283.0 lb

## 2015-04-07 DIAGNOSIS — M2550 Pain in unspecified joint: Secondary | ICD-10-CM

## 2015-04-07 DIAGNOSIS — M791 Myalgia, unspecified site: Secondary | ICD-10-CM

## 2015-04-07 DIAGNOSIS — M436 Torticollis: Secondary | ICD-10-CM

## 2015-04-07 DIAGNOSIS — M62838 Other muscle spasm: Secondary | ICD-10-CM

## 2015-04-07 DIAGNOSIS — M6249 Contracture of muscle, multiple sites: Secondary | ICD-10-CM

## 2015-04-07 LAB — POCT CBC
Granulocyte percent: 61.4 %G (ref 37–80)
HCT, POC: 39.6 % (ref 37.7–47.9)
Hemoglobin: 12.3 g/dL (ref 12.2–16.2)
LYMPH, POC: 3.3 (ref 0.6–3.4)
MCH, POC: 22.5 pg — AB (ref 27–31.2)
MCHC: 31.1 g/dL — AB (ref 31.8–35.4)
MCV: 72.3 fL — AB (ref 80–97)
MID (CBC): 0.6 (ref 0–0.9)
MPV: 7.3 fL (ref 0–99.8)
PLATELET COUNT, POC: 309 10*3/uL (ref 142–424)
POC Granulocyte: 6.3 (ref 2–6.9)
POC LYMPH PERCENT: 32.3 %L (ref 10–50)
POC MID %: 6.3 %M (ref 0–12)
RBC: 5.48 M/uL (ref 4.04–5.48)
RDW, POC: 17.4 %
WBC: 10.3 10*3/uL — AB (ref 4.6–10.2)

## 2015-04-07 LAB — COMPREHENSIVE METABOLIC PANEL
ALBUMIN: 3.8 g/dL (ref 3.5–5.2)
ALT: 18 U/L (ref 0–35)
AST: 16 U/L (ref 0–37)
Alkaline Phosphatase: 116 U/L (ref 39–117)
BUN: 12 mg/dL (ref 6–23)
CALCIUM: 9.5 mg/dL (ref 8.4–10.5)
CO2: 22 meq/L (ref 19–32)
Chloride: 105 mEq/L (ref 96–112)
Creat: 0.6 mg/dL (ref 0.50–1.10)
GLUCOSE: 103 mg/dL — AB (ref 70–99)
Potassium: 4.5 mEq/L (ref 3.5–5.3)
Sodium: 138 mEq/L (ref 135–145)
Total Bilirubin: 0.4 mg/dL (ref 0.2–1.2)
Total Protein: 7.3 g/dL (ref 6.0–8.3)

## 2015-04-07 LAB — POCT UA - MICROSCOPIC ONLY
Bacteria, U Microscopic: NEGATIVE
CASTS, UR, LPF, POC: NEGATIVE
Crystals, Ur, HPF, POC: NEGATIVE
Mucus, UA: POSITIVE
Yeast, UA: NEGATIVE

## 2015-04-07 LAB — TSH: TSH: 2.097 u[IU]/mL (ref 0.350–4.500)

## 2015-04-07 LAB — POCT URINALYSIS DIPSTICK
Bilirubin, UA: NEGATIVE
Blood, UA: NEGATIVE
Glucose, UA: NEGATIVE
KETONES UA: 15
Leukocytes, UA: NEGATIVE
Nitrite, UA: NEGATIVE
Protein, UA: NEGATIVE
Spec Grav, UA: 1.02
Urobilinogen, UA: 0.2
pH, UA: 6

## 2015-04-07 LAB — CK: CK TOTAL: 126 U/L (ref 7–177)

## 2015-04-07 LAB — C-REACTIVE PROTEIN: CRP: 1.7 mg/dL — AB (ref <0.60)

## 2015-04-07 LAB — POCT SEDIMENTATION RATE: POCT SED RATE: 33 mm/hr — AB (ref 0–22)

## 2015-04-07 MED ORDER — INDOMETHACIN 50 MG PO CAPS: 50.0000 mg | ORAL_CAPSULE | Freq: Three times a day (TID) | ORAL | Status: DC | PRN

## 2015-04-07 MED ORDER — CYCLOBENZAPRINE HCL 10 MG PO TABS: 10.0000 mg | ORAL_TABLET | Freq: Two times a day (BID) | ORAL | Status: DC | PRN

## 2015-04-07 NOTE — Patient Instructions (Signed)
Heat 20 minutes four times a day, gentle stretching. Make sure you are sleeping in a normal alignment - use a horseshoe pillow around your neck for good support - not more than 2 pillows. Take the indomethacin every day and the flexeril every night and rest for 2-3 days but then get back to normal activity as quickly as possible.  Torticollis, Acute You have suddenly (acutely) developed a twisted neck (torticollis). This is usually a self-limited condition. CAUSES  Acute torticollis may be caused by malposition, trauma or infection. Most commonly, acute torticollis is caused by sleeping in an awkward position. Torticollis may also be caused by the flexion, extension or twisting of the neck muscles beyond their normal position. Sometimes, the exact cause may not be known. SYMPTOMS  Usually, there is pain and limited movement of the neck. Your neck may twist to one side. DIAGNOSIS  The diagnosis is often made by physical examination. X-rays, CT scans or MRIs may be done if there is a history of trauma or concern of infection. TREATMENT  For a common, stiff neck that develops during sleep, treatment is focused on relaxing the contracted neck muscle. Medications (including shots) may be used to treat the problem. Most cases resolve in several days. Torticollis usually responds to conservative physical therapy. If left untreated, the shortened and spastic neck muscle can cause deformities in the face and neck. Rarely, surgery is required. HOME CARE INSTRUCTIONS   Use over-the-counter and prescription medications as directed by your caregiver.  Do stretching exercises and massage the neck as directed by your caregiver.  Follow up with physical therapy if needed and as directed by your caregiver. SEEK IMMEDIATE MEDICAL CARE IF:   You develop difficulty breathing or noisy breathing (stridor).  You drool, develop trouble swallowing or have pain with swallowing.  You develop numbness or weakness in  the hands or feet.  You have changes in speech or vision.  You have problems with urination or bowel movements.  You have difficulty walking.  You have a fever.  You have increased pain. MAKE SURE YOU:   Understand these instructions.  Will watch your condition.  Will get help right away if you are not doing well or get worse. Document Released: 08/31/2000 Document Revised: 11/26/2011 Document Reviewed: 10/12/2009 [facility] Patient Information 2015 [place], [place]. This information is not intended to replace advice given to you by your health care provider. Make sure you discuss any questions you have with your health care provider.  Muscle Cramps and Spasms Muscle cramps and spasms occur when a muscle or muscles tighten and you have no control over this tightening (involuntary muscle contraction). They are a common problem and can develop in any muscle. The most common place is in the calf muscles of the leg. Both muscle cramps and muscle spasms are involuntary muscle contractions, but they also have differences:   Muscle cramps are sporadic and painful. They may last a few seconds to a quarter of an hour. Muscle cramps are often more forceful and last longer than muscle spasms.  Muscle spasms may or may not be painful. They may also last just a few seconds or much longer. CAUSES  It is uncommon for cramps or spasms to be due to a serious underlying problem. In many cases, the cause of cramps or spasms is unknown. Some common causes are:   Overexertion.   Overuse from repetitive motions (doing the same thing over and over).   Remaining in a certain position for a  long period of time.   Improper preparation, form, or technique while performing a sport or activity.   Dehydration.   Injury.   Side effects of some medicines.   Abnormally low levels of the salts and ions in your blood (electrolytes), especially potassium and calcium. This could happen if you are taking  water pills (diuretics) or you are pregnant.  Some underlying medical problems can make it more likely to develop cramps or spasms. These include, but are not limited to:   Diabetes.   Parkinson disease.   Hormone disorders, such as thyroid problems.   Alcohol abuse.   Diseases specific to muscles, joints, and bones.   Blood vessel disease where not enough blood is getting to the muscles.  HOME CARE INSTRUCTIONS   Stay well hydrated. Drink enough water and fluids to keep your urine clear or pale yellow.  It may be helpful to massage, stretch, and relax the affected muscle.  For tight or tense muscles, use a warm towel, heating pad, or hot shower water directed to the affected area.  If you are sore or have pain after a cramp or spasm, applying ice to the affected area may relieve discomfort.  Put ice in a plastic bag.  Place a towel between your skin and the bag.  Leave the ice on for 15-20 minutes, 03-04 times a day.  Medicines used to treat a known cause of cramps or spasms may help reduce their frequency or severity. Only take over-the-counter or prescription medicines as directed by your caregiver. SEEK MEDICAL CARE IF:  Your cramps or spasms get more severe, more frequent, or do not improve over time.  MAKE SURE YOU:   Understand these instructions.  Will watch your condition.  Will get help right away if you are not doing well or get worse. Document Released: 02/23/2002 Document Revised: 12/29/2012 Document Reviewed: 08/20/2012 [facility] Patient Information 2015 [place], [place]. This information is not intended to replace advice given to you by your health care provider. Make sure you discuss any questions you have with your health care provider.

## 2015-04-07 NOTE — Progress Notes (Addendum)
Subjective:  This chart was scribed for Norberto Sorenson, MD by [facility], medical scribe at [facility].The patient was seen in exam room 01 and the patient's care was started at 3:24 PM.   Patient ID: Ashlee Thompson, female    DOB: [birth year], 19 y.o.   MRN: [id number] Chief Complaint  Patient presents with  . Neck Pain    x 1 day, left side of neck down to middle  . Back Pain    right lower into upper left x 3 days  . hip pain    x 3 days   HPI HPI Comments: Ashlee Thompson is a 19 y.o. female who presents to [facility] complaining of neck pain that radiates down to her back since yesterday. Pt believes she slept wrong. She also complains of a right hip pain ongoing for three days. She does have numbness and tingling in both arms. She has had  vision changes for the past two days ago, which worsens if she watches TV. Pt has had chills, nausea and five episodes of vomiting today. Troubling sleeping because of her hip pain. She has tried icy hot and ibuprofen 3 every six hours. No abnormal blood work, at her last physical at 67. Pt did fall three years ago and had similar pain. She denies fever.  Pt was seen in the ER less than four months for a variety of musculoskeletal pains as well as left arm tingling. Left neck and shoulder pain radiating from her chest. She has a history of panic attacks which induce headaches, muscle, arm and neck pain. The day prior she had an immunizations in the left arm which triggered muscle pain followed by a panic attack which lead to the ER visit. She was treated with valium and naprosyn. Negative troponin, CMP, BMP, chest x-ray and EKG were normal.    Past Medical History  Diagnosis Date  . Anxiety    History reviewed. No pertinent past surgical history. Current Outpatient Prescriptions on File Prior to Visit  Medication Sig Dispense Refill  . MedroxyPROGESTERone Acetate (DEPO-PROVERA IM) Inject into the muscle every  3 (three) months.    . ondansetron (ZOFRAN ODT) 4 MG disintegrating tablet 4mg  ODT q4 hours prn nausea/vomit 4 tablet 0   No current facility-administered medications on file prior to visit.   No Known Allergies Family History  Problem Relation Age of Onset  . CAD Other   . Cancer Other   . Hyperlipidemia Mother   . Mental illness Mother   . Hyperlipidemia Father   . Mental illness Brother   . Heart disease Paternal Grandfather   . Hyperlipidemia Paternal Grandfather   . Hypertension Paternal Grandfather    History   Social History  . Marital Status: Single    Spouse Name: N/A  . Number of Children: N/A  . Years of Education: N/A   Social History Main Topics  . Smoking status: Former Smoker    Types: Cigarettes  . Smokeless tobacco: Not on file  . Alcohol Use: No     Comment: rare  . Drug Use: No  . Sexual Activity: Yes    Birth Control/ Protection: Pill   Other Topics Concern  . None   Social History Narrative     Review of Systems  Constitutional: Positive for chills. Negative for fever and unexpected weight change.  Eyes: Positive for visual disturbance. Negative for photophobia and discharge.  Gastrointestinal: Positive for nausea and vomiting.  Musculoskeletal: Positive for myalgias, back pain, joint swelling, arthralgias, gait problem, neck pain and neck stiffness.  Skin: Negative for color change, rash and wound.  Allergic/Immunologic: Negative for food allergies and immunocompromised state.  Neurological: Positive for weakness, numbness and headaches.  Hematological: Negative for adenopathy.  Psychiatric/Behavioral: Positive for sleep disturbance.      Objective:  BP 118/74 mmHg  Pulse 107  Temp(Src) 98.3 F (36.8 C) (Oral)  Resp 16  Ht 5' 5.5" (1.664 m)  Wt 283 lb (128.368 kg)  BMI 46.36 kg/m2  SpO2 99% Physical Exam  Constitutional: She is oriented to person, place, and time. She appears well-developed and well-nourished. No distress.    HENT:  Head: Normocephalic and atraumatic.  Right Ear: Tympanic membrane normal.  Left Ear: Tympanic membrane normal.  Eyes: Pupils are equal, round, and reactive to light.  Neck: Normal range of motion.  Cardiovascular: Regular rhythm, S1 normal and S2 normal.  Tachycardia present.   Pulmonary/Chest: Effort normal and breath sounds normal. No respiratory distress. She has no wheezes.  Musculoskeletal: Normal range of motion.  Neurological: She is alert and oriented to person, place, and time.  Skin: Skin is warm and dry.  Psychiatric: She has a normal mood and affect. Her behavior is normal.  Nursing note and vitals reviewed.    [facility] reading (PRIMARY) by  Dr. Clelia Croft. C-spine - straightening of c-spine with loss of normal lordosis - questions of very slight spondylolisthesis at c4-5 L hip - normal.  Dg Cervical Spine Complete  04/07/2015   CLINICAL DATA:  Cervicalgia.  Initial encounter.  EXAM: CERVICAL SPINE  4+ VIEWS  COMPARISON:  None.  FINDINGS: C1 to the superior endplate of C7 is imaged on the provided lateral radiograph, however the cervical thoracic junction appears preserved on the provided motion degraded swimmer's radiograph.  There is straightening and slight reversal of the expected cervical lordosis with mild kyphosis centered about the C4-C5 articulation. There is minimal (approximately 1-2 mm) of anterolisthesis of C3 upon C4 and C4 upon C5. There is mild leftward deviation the dens between the lateral masses of C1, likely positional. The head is held in a persistent minimal amount of right lateral flexion, likely positional. The bilateral facets are normally aligned.  Cervical vertebral body heights are preserved. Prevertebral soft tissues are normal.  Intervertebral disc space heights appear preserved.  The bilateral neural foramina appear widely patent given obliquity.  Regional soft tissues appear normal. Limited visualization of the lung apices is normal.  IMPRESSION:  Straightening and slight reversal the expected cervical lordosis, nonspecific though could be seen in the setting of muscle spasm. Otherwise, no explanation for patient's neck pain.   Electronically Signed   By: Simonne Come M.D.   On: 04/07/2015 17:20   Dg Hip Unilat W Or W/o Pelvis 2-3 Views Right  04/07/2015   CLINICAL DATA:  Right hip pain.  EXAM: DG HIP (WITH OR WITHOUT PELVIS) 2-3V RIGHT  COMPARISON:  None.  FINDINGS: No fracture or dislocation. Right hip joint spaces appear preserved. No evidence of avascular necrosis. Limited visualization of the pelvis and contralateral left hip is normal. Regional soft tissues appear normal.  IMPRESSION: Normal radiographs of the right hip.   Electronically Signed   By: Simonne Come M.D.   On: 04/07/2015 17:21    Results for orders placed or performed in visit on 04/07/15  POCT CBC  Result Value Ref Range   WBC 10.3 (A) 4.6 - 10.2 K/uL   Lymph, poc 3.3  0.6 - 3.4   POC LYMPH PERCENT 32.3 10 - 50 %L   MID (cbc) 0.6 0 - 0.9   POC MID % 6.3 0 - 12 %M   POC Granulocyte 6.3 2 - 6.9   Granulocyte percent 61.4 37 - 80 %G   RBC 5.48 4.04 - 5.48 M/uL   Hemoglobin 12.3 12.2 - 16.2 g/dL   HCT, POC 40.9 81.1 - 47.9 %   MCV 72.3 (A) 80 - 97 fL   MCH, POC 22.5 (A) 27 - 31.2 pg   MCHC 31.1 (A) 31.8 - 35.4 g/dL   RDW, POC 91.4 %   Platelet Count, POC 309 142 - 424 K/uL   MPV 7.3 0 - 99.8 fL  POCT UA - Microscopic Only  Result Value Ref Range   WBC, Ur, HPF, POC 0-3    RBC, urine, microscopic 1-3    Bacteria, U Microscopic neg    Mucus, UA pos    Epithelial cells, urine per micros 2-4    Crystals, Ur, HPF, POC neg    Casts, Ur, LPF, POC neg    Yeast, UA neg   POCT urinalysis dipstick  Result Value Ref Range   Color, UA yellow    Clarity, UA clear    Glucose, UA neg    Bilirubin, UA neg    Ketones, UA 15    Spec Grav, UA 1.020    Blood, UA neg    pH, UA 6.0    Protein, UA neg    Urobilinogen, UA 0.2    Nitrite, UA neg    Leukocytes, UA Negative  Negative     Assessment & Plan:   1. Arthralgia   2. Myalgia   3. Cervical paraspinal muscle spasm   4. Acute torticollis     Orders Placed This Encounter  Procedures  . DG Cervical Spine Complete    Standing Status: Future     Number of Occurrences: 1     Standing Expiration Date: 04/06/2016    Order Specific Question:  Reason for Exam (SYMPTOM  OR DIAGNOSIS REQUIRED)    Answer:  diffuse pain and inability to move    Order Specific Question:  Is the patient pregnant?    Answer:  No    Order Specific Question:  Preferred imaging location?    Answer:  External  . DG HIP UNILAT W OR W/O PELVIS 2-3 VIEWS RIGHT    Standing Status: Future     Number of Occurrences: 1     Standing Expiration Date: 04/06/2016    Order Specific Question:  Reason for Exam (SYMPTOM  OR DIAGNOSIS REQUIRED)    Answer:  diffuse pain and inability to move    Order Specific Question:  Is the patient pregnant?    Answer:  No    Order Specific Question:  Preferred imaging location?    Answer:  External  . TSH  . Comprehensive metabolic panel  . CK  . C-reactive protein  . Ambulatory referral to Physical Therapy    Referral Priority:  Routine    Referral Type:  Physical Medicine    Referral Reason:  Specialty Services Required    Requested Specialty:  Physical Therapy    Number of Visits Requested:  1  . POCT CBC  . POCT SEDIMENTATION RATE  . POCT UA - Microscopic Only  . POCT urinalysis dipstick    Meds ordered this encounter  Medications  . indomethacin (INDOCIN) 50 MG capsule    Sig: Take  1 capsule (50 mg total) by mouth 3 (three) times daily as needed for moderate pain.    Dispense:  30 capsule    Refill:  1  . cyclobenzaprine (FLEXERIL) 10 MG tablet    Sig: Take 1 tablet (10 mg total) by mouth 2 (two) times daily as needed for muscle spasms.    Dispense:  30 tablet    Refill:  0    I personally performed the services described in this documentation, which was scribed in my presence. The  recorded information has been reviewed and considered, and addended by me as needed.  Norberto Sorenson, MD MPH

## 2015-04-08 ENCOUNTER — Other Ambulatory Visit (HOSPITAL_COMMUNITY)
Admission: RE | Admit: 2015-04-08 | Discharge: 2015-04-08 | Disposition: A | Discharge status: Home / Self Care | Payer: BLUE CROSS/BLUE SHIELD | Source: Ambulatory Visit | Attending: Obstetrics & Gynecology | Admitting: Obstetrics & Gynecology

## 2015-04-08 ENCOUNTER — Other Ambulatory Visit: Payer: Self-pay | Admitting: Obstetrics & Gynecology

## 2015-04-08 DIAGNOSIS — Z113 Encounter for screening for infections with a predominantly sexual mode of transmission: Secondary | ICD-10-CM | POA: Insufficient documentation

## 2015-04-08 DIAGNOSIS — Z01419 Encounter for gynecological examination (general) (routine) without abnormal findings: Secondary | ICD-10-CM | POA: Diagnosis present

## 2015-04-11 LAB — CYTOLOGY - PAP

## 2015-05-12 ENCOUNTER — Emergency Department (HOSPITAL_COMMUNITY): Payer: No Typology Code available for payment source

## 2015-05-12 ENCOUNTER — Encounter (HOSPITAL_COMMUNITY): Payer: Self-pay | Admitting: *Deleted

## 2015-05-12 ENCOUNTER — Emergency Department (HOSPITAL_COMMUNITY)
Admission: EM | Admit: 2015-05-12 | Discharge: 2015-05-13 | Disposition: A | Discharge status: Home / Self Care | Payer: No Typology Code available for payment source | Attending: Emergency Medicine | Admitting: Emergency Medicine

## 2015-05-12 DIAGNOSIS — Y92410 Unspecified street and highway as the place of occurrence of the external cause: Secondary | ICD-10-CM | POA: Insufficient documentation

## 2015-05-12 DIAGNOSIS — M542 Cervicalgia: Secondary | ICD-10-CM

## 2015-05-12 DIAGNOSIS — S0990XA Unspecified injury of head, initial encounter: Secondary | ICD-10-CM | POA: Diagnosis not present

## 2015-05-12 DIAGNOSIS — S199XXA Unspecified injury of neck, initial encounter: Secondary | ICD-10-CM | POA: Diagnosis not present

## 2015-05-12 DIAGNOSIS — S299XXA Unspecified injury of thorax, initial encounter: Secondary | ICD-10-CM | POA: Insufficient documentation

## 2015-05-12 DIAGNOSIS — Z8659 Personal history of other mental and behavioral disorders: Secondary | ICD-10-CM | POA: Diagnosis not present

## 2015-05-12 DIAGNOSIS — Y9389 Activity, other specified: Secondary | ICD-10-CM | POA: Diagnosis not present

## 2015-05-12 DIAGNOSIS — Y998 Other external cause status: Secondary | ICD-10-CM | POA: Diagnosis not present

## 2015-05-12 DIAGNOSIS — Z720 Tobacco use: Secondary | ICD-10-CM | POA: Diagnosis not present

## 2015-05-12 DIAGNOSIS — S8992XA Unspecified injury of left lower leg, initial encounter: Secondary | ICD-10-CM | POA: Diagnosis not present

## 2015-05-12 DIAGNOSIS — R079 Chest pain, unspecified: Secondary | ICD-10-CM

## 2015-05-12 MED ORDER — HYDROCODONE-ACETAMINOPHEN 5-325 MG PO TABS
2.0000 | ORAL_TABLET | Freq: Once | ORAL | Status: AC
  Administered 2015-05-12: 2 via ORAL
  Filled 2015-05-12: qty 2

## 2015-05-12 MED ORDER — METHOCARBAMOL 500 MG PO TABS: 500.0000 mg | ORAL_TABLET | Freq: Two times a day (BID) | ORAL | Status: DC

## 2015-05-12 MED ORDER — NAPROXEN 500 MG PO TABS: 500.0000 mg | ORAL_TABLET | Freq: Two times a day (BID) | ORAL | Status: DC
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
## 2015-06-21 ENCOUNTER — Other Ambulatory Visit: Payer: Self-pay | Admitting: Obstetrics & Gynecology

## 2015-06-21 DIAGNOSIS — E237 Disorder of pituitary gland, unspecified: Secondary | ICD-10-CM

## 2015-07-08 ENCOUNTER — Ambulatory Visit
Admission: RE | Admit: 2015-07-08 | Discharge: 2015-07-08 | Disposition: A | Discharge status: Home / Self Care | Payer: Medicaid Other | Source: Ambulatory Visit | Attending: Obstetrics & Gynecology | Admitting: Obstetrics & Gynecology

## 2015-07-08 DIAGNOSIS — E237 Disorder of pituitary gland, unspecified: Secondary | ICD-10-CM

## 2015-07-08 MED ORDER — GADOBENATE DIMEGLUMINE 529 MG/ML IV SOLN: 15.0000 mL | Freq: Once | INTRAVENOUS | Status: DC | PRN

## 2015-07-14 ENCOUNTER — Emergency Department (HOSPITAL_COMMUNITY)
Admission: EM | Admit: 2015-07-14 | Discharge: 2015-07-14 | Disposition: A | Discharge status: Home / Self Care | Payer: Medicaid Other | Attending: Emergency Medicine | Admitting: Emergency Medicine

## 2015-07-14 ENCOUNTER — Encounter (HOSPITAL_COMMUNITY): Payer: Self-pay | Admitting: Emergency Medicine

## 2015-07-14 DIAGNOSIS — Z720 Tobacco use: Secondary | ICD-10-CM | POA: Diagnosis not present

## 2015-07-14 DIAGNOSIS — N939 Abnormal uterine and vaginal bleeding, unspecified: Secondary | ICD-10-CM | POA: Diagnosis present

## 2015-07-14 DIAGNOSIS — Z3202 Encounter for pregnancy test, result negative: Secondary | ICD-10-CM | POA: Insufficient documentation

## 2015-07-14 DIAGNOSIS — N938 Other specified abnormal uterine and vaginal bleeding: Secondary | ICD-10-CM | POA: Diagnosis not present

## 2015-07-14 DIAGNOSIS — Z79899 Other long term (current) drug therapy: Secondary | ICD-10-CM | POA: Diagnosis not present

## 2015-07-14 DIAGNOSIS — Z8659 Personal history of other mental and behavioral disorders: Secondary | ICD-10-CM | POA: Insufficient documentation

## 2015-07-14 LAB — WET PREP, GENITAL
Clue Cells Wet Prep HPF POC: NONE SEEN
TRICH WET PREP: NONE SEEN
WBC, Wet Prep HPF POC: NONE SEEN
Yeast Wet Prep HPF POC: NONE SEEN

## 2015-07-14 LAB — COMPREHENSIVE METABOLIC PANEL
ALK PHOS: 80 U/L (ref 38–126)
ALT: 20 U/L (ref 14–54)
AST: 21 U/L (ref 15–41)
Albumin: 3.6 g/dL (ref 3.5–5.0)
Anion gap: 7 (ref 5–15)
BUN: 8 mg/dL (ref 6–20)
CO2: 26 mmol/L (ref 22–32)
CREATININE: 0.67 mg/dL (ref 0.44–1.00)
Calcium: 9.1 mg/dL (ref 8.9–10.3)
Chloride: 107 mmol/L (ref 101–111)
GFR calc Af Amer: >60 mL/min (ref >60)
Glucose, Bld: 108 mg/dL — ABNORMAL HIGH (ref 65–99)
Potassium: 4 mmol/L (ref 3.5–5.1)
Sodium: 140 mmol/L (ref 135–145)
TOTAL PROTEIN: 7.2 g/dL (ref 6.5–8.1)
Total Bilirubin: 0.3 mg/dL (ref 0.3–1.2)

## 2015-07-14 LAB — I-STAT CHEM 8, ED
BUN: 6 mg/dL (ref 6–20)
CALCIUM ION: 1.18 mmol/L (ref 1.12–1.23)
CHLORIDE: 104 mmol/L (ref 101–111)
Creatinine, Ser: 0.8 mg/dL (ref 0.44–1.00)
Glucose, Bld: 109 mg/dL — ABNORMAL HIGH (ref 65–99)
HCT: 44 % (ref 36.0–46.0)
HEMOGLOBIN: 15 g/dL (ref 12.0–15.0)
POTASSIUM: 3.9 mmol/L (ref 3.5–5.1)
Sodium: 141 mmol/L (ref 135–145)
TCO2: 25 mmol/L (ref 0–100)

## 2015-07-14 LAB — TYPE AND SCREEN
ABO/RH(D): B POS
ANTIBODY SCREEN: NEGATIVE

## 2015-07-14 LAB — CBC
HCT: 41.3 % (ref 36.0–46.0)
Hemoglobin: 12.8 g/dL (ref 12.0–15.0)
MCH: 22.8 pg — AB (ref 26.0–34.0)
MCHC: 31 g/dL (ref 30.0–36.0)
MCV: 73.6 fL — AB (ref 78.0–100.0)
PLATELETS: 347 10*3/uL (ref 150–400)
RBC: 5.61 MIL/uL — ABNORMAL HIGH (ref 3.87–5.11)
RDW: 14.6 % (ref 11.5–15.5)
WBC: 9.3 10*3/uL (ref 4.0–10.5)

## 2015-07-14 LAB — I-STAT BETA HCG BLOOD, ED (MC, WL, AP ONLY): I-stat hCG, quantitative: <5 m[IU]/mL (ref <5)

## 2015-07-14 LAB — URINALYSIS, ROUTINE W REFLEX MICROSCOPIC
BILIRUBIN URINE: NEGATIVE
GLUCOSE, UA: NEGATIVE mg/dL
KETONES UR: NEGATIVE mg/dL
Nitrite: NEGATIVE
PH: 6.5 (ref 5.0–8.0)
Protein, ur: NEGATIVE mg/dL
Specific Gravity, Urine: 1.018 (ref 1.005–1.030)
Urobilinogen, UA: 1 mg/dL (ref 0.0–1.0)

## 2015-07-14 LAB — URINE MICROSCOPIC-ADD ON

## 2015-07-14 LAB — LIPASE, BLOOD: Lipase: 45 U/L (ref 11–51)

## 2015-07-14 LAB — ABO/RH: ABO/RH(D): B POS

## 2015-07-14 MED ORDER — DEXAMETHASONE SODIUM PHOSPHATE 10 MG/ML IJ SOLN
10.0000 mg | Freq: Once | INTRAMUSCULAR | Status: AC
  Administered 2015-07-14: 10 mg via INTRAVENOUS

## 2015-07-14 MED ORDER — HYDROMORPHONE HCL 1 MG/ML IJ SOLN
1.0000 mg | Freq: Once | INTRAMUSCULAR | Status: AC
  Administered 2015-07-14: 1 mg via INTRAVENOUS
  Filled 2015-07-14: qty 1

## 2015-07-14 MED ORDER — NORGESTIMATE-ETH ESTRADIOL 0.25-35 MG-MCG PO TABS: 1.0000 | ORAL_TABLET | Freq: Every day | ORAL | Status: DC

## 2015-07-14 MED ORDER — SODIUM CHLORIDE 0.9 % IV BOLUS (SEPSIS)
1000.0000 mL | Freq: Once | INTRAVENOUS | Status: AC
  Administered 2015-07-14: 1000 mL via INTRAVENOUS

## 2015-07-14 MED ORDER — FENTANYL CITRATE (PF) 100 MCG/2ML IJ SOLN
100.0000 ug | Freq: Once | INTRAMUSCULAR | Status: AC
  Administered 2015-07-14: 100 ug via INTRAVENOUS
  Filled 2015-07-14: qty 2

## 2015-07-14 MED ORDER — FENTANYL CITRATE (PF) 100 MCG/2ML IJ SOLN: 100.0000 ug | Freq: Once | INTRAMUSCULAR | Status: DC

## 2015-07-14 MED ORDER — HYDROCODONE-ACETAMINOPHEN 5-325 MG PO TABS: 1.0000 | ORAL_TABLET | Freq: Four times a day (QID) | ORAL | Status: DC | PRN

## 2015-07-14 MED ORDER — DIPHENHYDRAMINE HCL 50 MG/ML IJ SOLN
25.0000 mg | Freq: Once | INTRAMUSCULAR | Status: AC
  Administered 2015-07-14: 25 mg via INTRAVENOUS
  Filled 2015-07-14: qty 1

## 2015-07-14 MED ORDER — METOCLOPRAMIDE HCL 5 MG/ML IJ SOLN
10.0000 mg | Freq: Once | INTRAMUSCULAR | Status: AC
  Administered 2015-07-14: 10 mg via INTRAVENOUS
  Filled 2015-07-14: qty 2

## 2015-07-14 MED ORDER — DEXAMETHASONE SODIUM PHOSPHATE 10 MG/ML IJ SOLN: 0.6000 mg/kg | Freq: Once | INTRAMUSCULAR | Status: DC

## 2015-07-14 MED ORDER — DEXAMETHASONE 10 MG/ML FOR PEDIATRIC ORAL USE
72.0000 mg | Freq: Once | INTRAMUSCULAR | Status: DC
  Filled 2015-07-14 (×2): qty 8

## 2015-07-14 NOTE — ED Notes (Signed)
Pt states that she as had vaginal bleeding x 3 weeks and now c/o lower abdominal pain with headache. Alert and oriented.

## 2015-07-14 NOTE — ED Provider Notes (Signed)
CSN: [id number]     Arrival date & time 07/14/15  1717 History   First MD Initiated Contact with Patient 07/14/15 1740     Chief Complaint  Patient presents with  . Vaginal Bleeding  . Abdominal Pain     (Consider location/radiation/quality/duration/timing/severity/associated sxs/prior Treatment) Patient is a 19 y.o. female presenting with vaginal bleeding.  Vaginal Bleeding Quality:  Clots Severity:  Mild Onset quality:  Gradual Duration:  3 weeks Timing:  Constant Progression:  Worsening Chronicity:  New Menstrual history:  Irregular Number of pads used:  'many Possible pregnancy: yes   Relieved by:  None tried Worsened by:  Nothing tried Ineffective treatments:  None tried Associated symptoms: abdominal pain   Associated symptoms: no back pain, no dizziness, no dysuria, no fever and no vaginal discharge     Past Medical History  Diagnosis Date  . Anxiety    History reviewed. No pertinent past surgical history. Family History  Problem Relation Age of Onset  . CAD Other   . Cancer Other   . Hyperlipidemia Mother   . Mental illness Mother   . Hyperlipidemia Father   . Mental illness Brother   . Heart disease Paternal Grandfather   . Hyperlipidemia Paternal Grandfather   . Hypertension Paternal Grandfather    Social History  Substance Use Topics  . Smoking status: Current Some Day Smoker    Types: Cigarettes, Cigars  . Smokeless tobacco: None  . Alcohol Use: No     Comment: rare   OB History    No data available     Review of Systems  Constitutional: Negative for fever and chills.  Gastrointestinal: Positive for abdominal pain. Negative for anal bleeding.  Endocrine: Negative for polydipsia and polyuria.  Genitourinary: Positive for vaginal bleeding and pelvic pain. Negative for dysuria, hematuria, flank pain, vaginal discharge and vaginal pain.  Musculoskeletal: Negative for back pain.  Neurological: Negative for dizziness.  All other systems  reviewed and are negative.     Allergies  Review of patient's allergies indicates no known allergies.  Home Medications   Prior to Admission medications   Medication Sig Start Date End Date Taking? Authorizing Provider  metFORMIN (GLUCOPHAGE) 500 MG tablet Take 500 mg by mouth 2 (two) times daily with a meal.   Yes Historical Provider, MD  HYDROcodone-acetaminophen (NORCO/VICODIN) 5-325 MG tablet Take 1 tablet by mouth every 6 (six) hours as needed. 07/14/15   Ashlee MemosJason Logan [place], MD  norgestimate-ethinyl estradiol (SPRINTEC 28) 0.25-35 MG-MCG tablet Take 1 tablet by mouth daily. 07/14/15   Earnest Thalman, MD   BP 150/90 mmHg  Pulse 108  Temp(Src) 98.3 F (36.8 C) (Oral)  Resp 16  SpO2 98%  LMP 06/23/2015 Physical Exam  Constitutional: She is oriented to person, place, and time. She appears well-developed and well-nourished.  HENT:  Head: Normocephalic and atraumatic.  Eyes: Conjunctivae and EOM are normal. Right eye exhibits no discharge. Left eye exhibits no discharge.  Neck: Normal range of motion.  Cardiovascular: Normal rate and regular rhythm.   Pulmonary/Chest: Effort normal and breath sounds normal. No respiratory distress.  Abdominal: Soft. She exhibits no distension. There is no tenderness. There is no rebound.  Genitourinary: There is no tenderness or lesion on the right labia. There is no tenderness or lesion on the left labia. There is bleeding in the vagina. No erythema or tenderness in the vagina. No foreign body around the vagina.  Musculoskeletal: Normal range of motion. She exhibits no edema or tenderness.  Neurological: She  is alert and oriented to person, place, and time.  Skin: Skin is warm and dry.  Nursing note and vitals reviewed.   ED Course  Procedures (including critical care time) Labs Review Labs Reviewed  COMPREHENSIVE METABOLIC PANEL - Abnormal; Notable for the following:    Glucose, Bld 108 (*)    All other components within normal limits  CBC -  Abnormal; Notable for the following:    RBC 5.61 (*)    MCV 73.6 (*)    MCH 22.8 (*)    All other components within normal limits  URINALYSIS, ROUTINE W REFLEX MICROSCOPIC (NOT AT [facility]) - Abnormal; Notable for the following:    Hgb urine dipstick LARGE (*)    Leukocytes, UA TRACE (*)    All other components within normal limits  I-STAT CHEM 8, ED - Abnormal; Notable for the following:    Glucose, Bld 109 (*)    All other components within normal limits  WET PREP, GENITAL  LIPASE, BLOOD  URINE MICROSCOPIC-ADD ON  I-STAT BETA HCG BLOOD, ED ([facility], [facility], [facility] ONLY)  POC URINE PREG, ED  TYPE AND SCREEN  ABO/RH  GC/CHLAMYDIA PROBE AMP ([place]) NOT AT [facility]    Imaging Review No results found. I have personally reviewed and evaluated these images and lab results as part of my medical decision-making.   EKG Interpretation None      MDM   Final diagnoses:  Dysfunctional uterine bleeding    19 year old female here with dysfunction uterine bleeding. No vital sign abnormalities. Initially she has some tachycardia however she related this was secondary to pain and anxiety is resolved prior to any initiation of fluids or pain medication and continued to be normal afterwards. Hemoglobin was normal. Symptoms improved still with some slight crampiness in her abdomen. Pelvic exam as document above without any obvious injuries and no significant blood loss. Secondary to patient's stable vital signs and being asymptomatic from the blood loss itself deferred ultrasound to her primary doctor.  I have personally and contemperaneously reviewed labs and imaging and used in my decision making as above.   A medical screening exam was performed and I feel the patient has had an appropriate workup for their chief complaint at this time and likelihood of emergent condition existing is low. They have been counseled on decision, discharge, follow up and which symptoms necessitate immediate return to the  emergency department. They or their family verbally stated understanding and agreement with plan and discharged in stable condition.      Ashlee Memos, MD 07/15/15 [phone]

## 2015-07-14 NOTE — Discharge Instructions (Signed)
You have received a prescription for 2 packages of a medication called Sprintec. This is an oral contraceptive.  This one package he should take 3 pills a day for one week then throw the rest away. This should help with your uterine bleeding. It should cease during this time. After this week take one week off and during this time and will likely have a regular period. After the one week off without medication get the other prescription filled which is the same medication. Then start taking it once daily as instructed on the package. This may cause a little bit of nausea when you're taking 3 a day if so you may back down to twice a day. Please follow-up with your gynecologist in 1-2 weeks for further evaluation and management of your vaginal bleeding.

## 2015-07-14 NOTE — Progress Notes (Signed)
Patient listed as having Medicaid insurance without a pcp.  EDCM spoke to patient at bedside.  Patient reports she has a OBGYN Dr. Myna HidalgoJennifer Ozan but does not have a pcp.  [facility] with a list of pcps who accept Medicaid [place] in [place].  Patient thankful for resources.  No further EDCM needs at this time.

## 2015-07-15 LAB — GC/CHLAMYDIA PROBE AMP (~~LOC~~) NOT AT ARMC
Chlamydia: NEGATIVE
Neisseria Gonorrhea: NEGATIVE

## 2015-07-19 ENCOUNTER — Emergency Department (INDEPENDENT_AMBULATORY_CARE_PROVIDER_SITE_OTHER)
Admission: EM | Admit: 2015-07-19 | Discharge: 2015-07-19 | Disposition: A | Discharge status: Home / Self Care | Payer: Medicaid Other | Source: Home / Self Care | Attending: Emergency Medicine | Admitting: Emergency Medicine

## 2015-07-19 ENCOUNTER — Encounter (HOSPITAL_COMMUNITY): Payer: Self-pay | Admitting: Emergency Medicine

## 2015-07-19 DIAGNOSIS — N939 Abnormal uterine and vaginal bleeding, unspecified: Secondary | ICD-10-CM | POA: Diagnosis not present

## 2015-07-19 DIAGNOSIS — R102 Pelvic and perineal pain: Secondary | ICD-10-CM

## 2015-07-19 DIAGNOSIS — G43009 Migraine without aura, not intractable, without status migrainosus: Secondary | ICD-10-CM

## 2015-07-19 DIAGNOSIS — N949 Unspecified condition associated with female genital organs and menstrual cycle: Secondary | ICD-10-CM

## 2015-07-19 HISTORY — DX: Type 2 diabetes mellitus without complications: E11.9

## 2015-07-19 LAB — POCT URINALYSIS DIP (DEVICE)
Bilirubin Urine: NEGATIVE
GLUCOSE, UA: NEGATIVE mg/dL
KETONES UR: NEGATIVE mg/dL
LEUKOCYTES UA: NEGATIVE
Nitrite: NEGATIVE
Protein, ur: NEGATIVE mg/dL
SPECIFIC GRAVITY, URINE: 1.02 (ref 1.005–1.030)
UROBILINOGEN UA: 0.2 mg/dL (ref 0.0–1.0)
pH: 7 (ref 5.0–8.0)

## 2015-07-19 LAB — POCT I-STAT, CHEM 8
BUN: 10 mg/dL (ref 6–20)
CALCIUM ION: 1.24 mmol/L — AB (ref 1.12–1.23)
Chloride: 101 mmol/L (ref 101–111)
Creatinine, Ser: 0.7 mg/dL (ref 0.44–1.00)
Glucose, Bld: 83 mg/dL (ref 65–99)
HEMATOCRIT: 45 % (ref 36.0–46.0)
Hemoglobin: 15.3 g/dL — ABNORMAL HIGH (ref 12.0–15.0)
Potassium: 3.9 mmol/L (ref 3.5–5.1)
SODIUM: 135 mmol/L (ref 135–145)
TCO2: 24 mmol/L (ref 0–100)

## 2015-07-19 LAB — POCT PREGNANCY, URINE: PREG TEST UR: NEGATIVE

## 2015-07-19 MED ORDER — IBUPROFEN 600 MG PO TABS: 600.0000 mg | ORAL_TABLET | Freq: Four times a day (QID) | ORAL | Status: DC | PRN

## 2015-07-19 MED ORDER — ONDANSETRON HCL 4 MG PO TABS: 4.0000 mg | ORAL_TABLET | Freq: Three times a day (TID) | ORAL | Status: DC | PRN

## 2015-07-19 MED ORDER — TRAMADOL HCL 50 MG PO TABS: 50.0000 mg | ORAL_TABLET | Freq: Four times a day (QID) | ORAL | Status: DC | PRN

## 2015-07-19 MED ORDER — MEDROXYPROGESTERONE ACETATE 10 MG PO TABS: 10.0000 mg | ORAL_TABLET | Freq: Every day | ORAL | Status: DC

## 2015-07-19 NOTE — Discharge Instructions (Signed)
I suspect your bleeding, pain, and headaches are due to no longer being on the depo shot. Take Provera daily for the next 14 days to stop the bleeding. When you stop taking Provera, you should have a normal period.  This medicine will also help with the headaches and pain. Take ibuprofen 600 mg every 6 hours. Take Zofran every 8 hours as needed for nausea. Use the tramadol every 6-8 hours as needed for pain. Do not drive taking this medicine. Please call your gynecologist to set up an appointment.

## 2015-07-19 NOTE — ED Provider Notes (Signed)
CSN: [id number]     Arrival date & time 07/19/15  1428 History   First MD Initiated Contact with Patient 07/19/15 1509     Chief Complaint  Patient presents with  . Vaginal Bleeding   (Consider location/radiation/quality/duration/timing/severity/associated sxs/prior Treatment) HPI  She is a 19 year old woman here for evaluation of vaginal bleeding. She states she was supposed to switch from Depo to Nexplanon 6 weeks ago.  Unfortunately, her gynecologist was called away for a delivery. She has been unable to reschedule her appointment for Nexplanon insertion. Approximately 6 weeks ago, she started having vaginal bleeding. She reports period like bleeding, but with large clots.  She also reports lower abdominal and lower back discomfort and cramps. She is also having nausea and some vomiting. She reports frequent migraine headaches over the last 6 weeks. While on the Depo shot, she states she did not have periods.  Prior to starting birth control, she reports heavy and irregular periods.  No dizziness. No chest pain or shortness of breath.  She was seen at the [facility] emergency room 4 days ago for this. She had STD testing done at that time that was negative.  Past Medical History  Diagnosis Date  . Anxiety   . Diabetes mellitus without complication (HCC)    History reviewed. No pertinent past surgical history. Family History  Problem Relation Age of Onset  . CAD Other   . Cancer Other   . Hyperlipidemia Mother   . Mental illness Mother   . Hyperlipidemia Father   . Mental illness Brother   . Heart disease Paternal Grandfather   . Hyperlipidemia Paternal Grandfather   . Hypertension Paternal Grandfather    Social History  Substance Use Topics  . Smoking status: Never Smoker   . Smokeless tobacco: None  . Alcohol Use: No     Comment: rare   OB History    No data available     Review of Systems As in history of present illness Allergies  Review of patient's allergies  indicates no known allergies.  Home Medications   Prior to Admission medications   Medication Sig Start Date End Date Taking? Authorizing Provider  HYDROcodone-acetaminophen (NORCO/VICODIN) 5-325 MG tablet Take 1 tablet by mouth every 6 (six) hours as needed. 07/14/15   [name], MD  ibuprofen (ADVIL,MOTRIN) 600 MG tablet Take 1 tablet (600 mg total) by mouth every 6 (six) hours as needed. 07/19/15   Charm Rings, MD  medroxyPROGESTERone (PROVERA) 10 MG tablet Take 1 tablet (10 mg total) by mouth daily. 07/19/15   Charm Rings, MD  metFORMIN (GLUCOPHAGE) 500 MG tablet Take 500 mg by mouth 2 (two) times daily with a meal.    Historical Provider, MD  norgestimate-ethinyl estradiol (SPRINTEC 28) 0.25-35 MG-MCG tablet Take 1 tablet by mouth daily. 07/14/15   [name], MD  ondansetron (ZOFRAN) 4 MG tablet Take 1 tablet (4 mg total) by mouth every 8 (eight) hours as needed for nausea or vomiting. 07/19/15   Charm Rings, MD  traMADol (ULTRAM) 50 MG tablet Take 1 tablet (50 mg total) by mouth every 6 (six) hours as needed. 07/19/15   Charm Rings, MD   Meds Ordered and Administered this Visit  Medications - No data to display  BP 133/87 mmHg  Pulse 99  Temp(Src) 98.8 F (37.1 C) (Oral)  Resp 16  SpO2 100%  LMP 07/19/2015 No data found.   Physical Exam  Constitutional: She is oriented to person, place, and  time. She appears well-developed and well-nourished. No distress.  Neck: Neck supple.  Cardiovascular: Normal rate.   Pulmonary/Chest: Effort normal.  Abdominal: Soft. Bowel sounds are normal. She exhibits no distension. There is tenderness (across lower abdomen). There is no rebound and no guarding.  Neurological: She is alert and oriented to person, place, and time.    ED Course  Procedures (including critical care time)  Labs Review Labs Reviewed  POCT URINALYSIS DIP (DEVICE) - Abnormal; Notable for the following:    Hgb urine dipstick MODERATE (*)    All other components  within normal limits  POCT I-STAT, CHEM 8 - Abnormal; Notable for the following:    Calcium, Ion 1.24 (*)    Hemoglobin 15.3 (*)    All other components within normal limits  POCT PREGNANCY, URINE    Imaging Review No results found.    MDM   1. Vaginal bleeding   2. Pelvic cramping   3. Migraine without aura and without status migrainosus, not intractable    I suspect all of her symptoms are coming from being off the depot shot for the first time in a while. We'll treat with Provera for 14 days to see if we can improve the headaches and bleeding. Ibuprofen, Zofran, and tramadol given for symptomatic relief. She will follow-up with her gynecologist as soon as possible.    Charm RingsErin J Naithan Delage, MD 07/19/15 [phone]

## 2015-07-19 NOTE — ED Notes (Signed)
Last depo injection was June.  Reports the patient and her pcp were planning to change patient to an implant due to weight gain. Patient has lower abdominal pain and lower back pain.  Reports bleeding for a month.  Went to ED and was started on birth control pills.

## 2015-07-30 ENCOUNTER — Emergency Department (INDEPENDENT_AMBULATORY_CARE_PROVIDER_SITE_OTHER)
Admission: EM | Admit: 2015-07-30 | Discharge: 2015-07-30 | Disposition: A | Discharge status: Home / Self Care | Payer: Medicaid Other | Source: Home / Self Care | Attending: Emergency Medicine | Admitting: Emergency Medicine

## 2015-07-30 ENCOUNTER — Emergency Department (INDEPENDENT_AMBULATORY_CARE_PROVIDER_SITE_OTHER): Payer: Medicaid Other

## 2015-07-30 ENCOUNTER — Encounter (HOSPITAL_COMMUNITY): Payer: Self-pay | Admitting: Emergency Medicine

## 2015-07-30 DIAGNOSIS — S161XXA Strain of muscle, fascia and tendon at neck level, initial encounter: Secondary | ICD-10-CM | POA: Diagnosis not present

## 2015-07-30 DIAGNOSIS — R209 Unspecified disturbances of skin sensation: Secondary | ICD-10-CM

## 2015-07-30 DIAGNOSIS — M5442 Lumbago with sciatica, left side: Secondary | ICD-10-CM | POA: Diagnosis not present

## 2015-07-30 DIAGNOSIS — M6248 Contracture of muscle, other site: Secondary | ICD-10-CM | POA: Diagnosis not present

## 2015-07-30 DIAGNOSIS — M62838 Other muscle spasm: Secondary | ICD-10-CM

## 2015-07-30 DIAGNOSIS — IMO0001 Reserved for inherently not codable concepts without codable children: Secondary | ICD-10-CM

## 2015-07-30 MED ORDER — METAXALONE 800 MG PO TABS: 800.0000 mg | ORAL_TABLET | Freq: Three times a day (TID) | ORAL | Status: DC

## 2015-07-30 MED ORDER — TRAMADOL HCL 50 MG PO TABS: ORAL_TABLET | ORAL | Status: DC

## 2015-07-30 MED ORDER — PREDNISONE 10 MG (21) PO TBPK: ORAL_TABLET | ORAL | Status: DC

## 2015-07-30 MED ORDER — DICLOFENAC SODIUM 75 MG PO TBEC: 75.0000 mg | DELAYED_RELEASE_TABLET | Freq: Two times a day (BID) | ORAL | Status: DC
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
## 2015-07-30 NOTE — Discharge Instructions (Signed)
Follow-up with orthopedics if not better with this regimen in one week. Go to the ER if you accidentally urinate or defecate on yourself, for worsening weakness in your arm or leg, for pain not controlled with medications or for other concerns.

## 2015-07-30 NOTE — ED Notes (Signed)
Pt reports she fell backwards 5 days ago at work onto concrete C/o back/neck/bilateral leg and feet, HA pain Has tried heating pads and rest w/no relief A&O x4... No acute distress.

## 2015-08-21 ENCOUNTER — Emergency Department (HOSPITAL_COMMUNITY)
Admission: EM | Admit: 2015-08-21 | Discharge: 2015-08-21 | Disposition: A | Discharge status: Home / Self Care | Payer: Medicaid Other | Attending: Emergency Medicine | Admitting: Emergency Medicine

## 2015-08-21 ENCOUNTER — Emergency Department (HOSPITAL_COMMUNITY): Payer: Medicaid Other

## 2015-08-21 ENCOUNTER — Encounter (HOSPITAL_COMMUNITY): Payer: Self-pay | Admitting: Nurse Practitioner

## 2015-08-21 DIAGNOSIS — Y998 Other external cause status: Secondary | ICD-10-CM | POA: Insufficient documentation

## 2015-08-21 DIAGNOSIS — W25XXXA Contact with sharp glass, initial encounter: Secondary | ICD-10-CM | POA: Diagnosis not present

## 2015-08-21 DIAGNOSIS — S61411A Laceration without foreign body of right hand, initial encounter: Secondary | ICD-10-CM | POA: Insufficient documentation

## 2015-08-21 DIAGNOSIS — E119 Type 2 diabetes mellitus without complications: Secondary | ICD-10-CM | POA: Insufficient documentation

## 2015-08-21 DIAGNOSIS — Y9289 Other specified places as the place of occurrence of the external cause: Secondary | ICD-10-CM | POA: Diagnosis not present

## 2015-08-21 DIAGNOSIS — S61214A Laceration without foreign body of right ring finger without damage to nail, initial encounter: Secondary | ICD-10-CM | POA: Insufficient documentation

## 2015-08-21 DIAGNOSIS — Z7984 Long term (current) use of oral hypoglycemic drugs: Secondary | ICD-10-CM | POA: Diagnosis not present

## 2015-08-21 DIAGNOSIS — IMO0002 Reserved for concepts with insufficient information to code with codable children: Secondary | ICD-10-CM

## 2015-08-21 DIAGNOSIS — Z791 Long term (current) use of non-steroidal anti-inflammatories (NSAID): Secondary | ICD-10-CM | POA: Insufficient documentation

## 2015-08-21 DIAGNOSIS — Z8659 Personal history of other mental and behavioral disorders: Secondary | ICD-10-CM | POA: Insufficient documentation

## 2015-08-21 DIAGNOSIS — Y9389 Activity, other specified: Secondary | ICD-10-CM | POA: Insufficient documentation

## 2015-08-21 DIAGNOSIS — Z23 Encounter for immunization: Secondary | ICD-10-CM | POA: Insufficient documentation

## 2015-08-21 MED ORDER — OXYCODONE-ACETAMINOPHEN 5-325 MG PO TABS
1.0000 | ORAL_TABLET | Freq: Once | ORAL | Status: AC
Start: 2015-08-21 — End: 2015-08-21
  Administered 2015-08-21: 1 via ORAL
  Filled 2015-08-21: qty 1

## 2015-08-21 MED ORDER — HYDROCODONE-ACETAMINOPHEN 5-325 MG PO TABS: 2.0000 | ORAL_TABLET | ORAL | Status: DC | PRN

## 2015-08-21 MED ORDER — TETANUS-DIPHTH-ACELL PERTUSSIS 5-2.5-18.5 LF-MCG/0.5 IM SUSP
0.5000 mL | Freq: Once | INTRAMUSCULAR | Status: AC
  Administered 2015-08-21: 0.5 mL via INTRAMUSCULAR
  Filled 2015-08-21: qty 0.5

## 2015-08-21 MED ORDER — LIDOCAINE HCL 2 % IJ SOLN
INTRAMUSCULAR | Status: AC
  Filled 2015-08-21: qty 20

## 2015-08-21 MED ORDER — LIDOCAINE HCL 2 % IJ SOLN: 20.0000 mL | Freq: Once | INTRAMUSCULAR | Status: DC

## 2015-08-21 NOTE — ED Provider Notes (Addendum)
CSN: [id number]     Arrival date & time 08/21/15  0057 History  By signing my name below, I, [name], attest that this documentation has been prepared under the direction and in the presence of Gilda Creasehristopher J Malaney Mcbean, MD. Electronically Signed: [name], ED Scribe. 08/21/2015. 2:29 AM.    Chief Complaint  Patient presents with  . Extremity Laceration    The history is provided by the patient. No language interpreter was used.    HPI Comments: Ashlee Thompson is a 19 y.o. female with a history of anxiety and DM, who presents to the Emergency Department complaining of a laceration on her right hand that she had sustained PTA injuring herself while opening a wine bottle. Patient states she was helping her friend open the bottle when the glass bottle shattered. She denies being on any anticoagulation. She states she cannot remember the date of her last tetanus vaccination.  Past Medical History  Diagnosis Date  . Anxiety   . Diabetes mellitus without complication (HCC)    History reviewed. No pertinent past surgical history. Family History  Problem Relation Age of Onset  . CAD Other   . Cancer Other   . Hyperlipidemia Mother   . Mental illness Mother   . Hyperlipidemia Father   . Mental illness Brother   . Heart disease Paternal Grandfather   . Hyperlipidemia Paternal Grandfather   . Hypertension Paternal Grandfather    Social History  Substance Use Topics  . Smoking status: Never Smoker   . Smokeless tobacco: None  . Alcohol Use: No     Comment: rare   OB History    No data available     Review of Systems  Skin: Positive for wound.  Hematological: Does not bruise/bleed easily.  All other systems reviewed and are negative.  Allergies  Review of patient's allergies indicates no known allergies.  Home Medications   Prior to Admission medications   Medication Sig Start Date End Date Taking? Authorizing Provider  etonogestrel (NEXPLANON) 68 MG IMPL implant 1 each by  Subdermal route once. 08/03/15  Yes Historical Provider, MD  metFORMIN (GLUCOPHAGE) 500 MG tablet Take 500 mg by mouth 2 (two) times daily with a meal.   Yes Historical Provider, MD  naproxen sodium (ANAPROX) 220 MG tablet Take 440 mg by mouth 2 (two) times daily as needed (pain).   Yes Historical Provider, MD  diclofenac (VOLTAREN) 75 MG EC tablet Take 1 tablet (75 mg total) by mouth 2 (two) times daily. Take with food Patient not taking: Reported on 08/21/2015 07/30/15   Domenick GongAshley Mortenson, MD  medroxyPROGESTERone (PROVERA) 10 MG tablet Take 1 tablet (10 mg total) by mouth daily. Patient not taking: Reported on 08/21/2015 07/19/15   Charm RingsErin J Honig, MD  metaxalone (SKELAXIN) 800 MG tablet Take 1 tablet (800 mg total) by mouth 3 (three) times daily. Patient not taking: Reported on 08/21/2015 07/30/15   Domenick GongAshley Mortenson, MD  norgestimate-ethinyl estradiol (SPRINTEC 28) 0.25-35 MG-MCG tablet Take 1 tablet by mouth daily. Patient not taking: Reported on 08/21/2015 07/14/15   Marily MemosJason Mesner, MD  ondansetron (ZOFRAN) 4 MG tablet Take 1 tablet (4 mg total) by mouth every 8 (eight) hours as needed for nausea or vomiting. Patient not taking: Reported on 08/21/2015 07/19/15   Charm RingsErin J Honig, MD  predniSONE (STERAPRED UNI-PAK 21 TAB) 10 MG (21) TBPK tablet Dispense one 6 day pack. Take as directed with food. Patient not taking: Reported on 08/21/2015 07/30/15   Domenick GongAshley Mortenson, MD  traMADol (ULTRAM) 50 MG tablet 1-2 tabs po q 6 hr prn pain Maximum dose= 8 tablets per day Patient not taking: Reported on 08/21/2015 07/30/15   Domenick Gong, MD   BP 135/75 mmHg  Pulse 90  Temp(Src) 98.1 F (36.7 C)  Resp 24  SpO2 99%  LMP 07/19/2015 (LMP Unknown) Physical Exam  Constitutional: She is oriented to person, place, and time. She appears well-developed and well-nourished. No distress.  HENT:  Head: Normocephalic and atraumatic.  Right Ear: Hearing normal.  Left Ear: Hearing normal.  Nose: Nose normal.  Mouth/Throat:  Oropharynx is clear and moist and mucous membranes are normal.  Eyes: Conjunctivae and EOM are normal. Pupils are equal, round, and reactive to light.  Neck: Normal range of motion. Neck supple.  Cardiovascular: Regular rhythm, S1 normal and S2 normal.  Exam reveals no gallop and no friction rub.   No murmur heard. Pulmonary/Chest: Effort normal and breath sounds normal. No respiratory distress. She exhibits no tenderness.  Abdominal: Soft. Normal appearance and bowel sounds are normal. There is no hepatosplenomegaly. There is no tenderness. There is no rebound, no guarding, no tenderness at McBurney's point and negative Murphy's sign. No hernia.  Musculoskeletal: Normal range of motion.  Neurological: She is alert and oriented to person, place, and time. She has normal strength. No cranial nerve deficit or sensory deficit. Coordination normal. GCS eye subscore is 4. GCS verbal subscore is 5. GCS motor subscore is 6.  Normal sensation across all nerve distributions of hand.  Patient not moving fourth and fifth fingers in either flexion or extension, appears to have painful inhibition. Difficult to evaluate neurologic function.  Skin: Skin is warm, dry and intact. No rash noted. No cyanosis.  2 cm laceration over the palmar aspect of the right hand at 5th metacarpal. Ulnar 1 cm laceration on the 4th digit at the PIP joint.   Psychiatric: She has a normal mood and affect. Her speech is normal and behavior is normal. Thought content normal.  Nursing note and vitals reviewed.   ED Course  Procedures (including critical care time)  DIAGNOSTIC STUDIES: Oxygen Saturation is 99% on RA, normal by my interpretation.    COORDINATION OF CARE: 2:18 AM - Discussed treatment plan with pt at bedside which includes right hand XR, laceration repair, Tdap, and pain medication. Pt verbalized understanding and agreed to plan.   Imaging Review No results found. I have personally reviewed and evaluated these  images and lab results as part of my medical decision-making.  MDM   Final diagnoses:  None  Laceration  Presents to the emergency room for evaluation of laceration on hand and ring finger. She cut her hand on broken glass on a wine bottle shattered that she was holding. X-ray does not show any foreign body or bone injury.  Examination was difficult. She has normal sensory function. She appears to have normal motor function, but is reluctant to move the third and fourth fingers because of lacerations. This appears to be painful inhibition, but cannot rule out functional deficit. No identified tendon injury on repair. Lacerations are not in a distribution that would cause functional deficit. Because of her difficult examination, however, will refer to hand surgery for repeat evaluation. Suture removal in 8-10 days.  Suture procedure performed by Lonia Skinner. Sofia, PA-C. See separate note for procedure documentation.  I personally performed the services described in this documentation, which was scribed in my presence. The recorded information has been reviewed and is accurate.  Gilda Crease, MD 08/21/15 1610  Gilda Crease, MD 08/21/15 [phone]

## 2015-08-21 NOTE — Discharge Instructions (Signed)
Sutures need to be removed in 10 days. Follow-up with hand surgeon to ensure that there are no deeper injuries. If hand specialist does not take out sutures, follow-up with her primary doctor or urgent care in 10 days for suture removal.  Laceration Care, Adult A laceration is a cut that goes through all of the layers of the skin and into the tissue that is right under the skin. Some lacerations heal on their own. Others need to be closed with stitches (sutures), staples, skin adhesive strips, or skin glue. Proper laceration care minimizes the risk of infection and helps the laceration to heal better. HOW TO CARE FOR YOUR LACERATION If sutures or staples were used:  Keep the wound clean and dry.  If you were given a bandage (dressing), you should change it at least one time per day or as told by your health care provider. You should also change it if it becomes wet or dirty.  Keep the wound completely dry for the first 24 hours or as told by your health care provider. After that time, you may shower or bathe. However, make sure that the wound is not soaked in water until after the sutures or staples have been removed.  Clean the wound one time each day or as told by your health care provider:  Wash the wound with soap and water.  Rinse the wound with water to remove all soap.  Pat the wound dry with a clean towel. Do not rub the wound.  After cleaning the wound, apply a thin layer of antibiotic ointmentas told by your health care provider. This will help to prevent infection and keep the dressing from sticking to the wound.  Have the sutures or staples removed as told by your health care provider. If skin adhesive strips were used:  Keep the wound clean and dry.  If you were given a bandage (dressing), you should change it at least one time per day or as told by your health care provider. You should also change it if it becomes dirty or wet.  Do not get the skin adhesive strips wet.  You may shower or bathe, but be careful to keep the wound dry.  If the wound gets wet, pat it dry with a clean towel. Do not rub the wound.  Skin adhesive strips fall off on their own. You may trim the strips as the wound heals. Do not remove skin adhesive strips that are still stuck to the wound. They will fall off in time. If skin glue was used:  Try to keep the wound dry, but you may briefly wet it in the shower or bath. Do not soak the wound in water, such as by swimming.  After you have showered or bathed, gently pat the wound dry with a clean towel. Do not rub the wound.  Do not do any activities that will make you sweat heavily until the skin glue has fallen off on its own.  Do not apply liquid, cream, or ointment medicine to the wound while the skin glue is in place. Using those may loosen the film before the wound has healed.  If you were given a bandage (dressing), you should change it at least one time per day or as told by your health care provider. You should also change it if it becomes dirty or wet.  If a dressing is placed over the wound, be careful not to apply tape directly over the skin glue. Doing that may  cause the glue to be pulled off before the wound has healed.  Do not pick at the glue. The skin glue usually remains in place for 5-10 days, then it falls off of the skin. General Instructions  Take over-the-counter and prescription medicines only as told by your health care provider.  If you were prescribed an antibiotic medicine or ointment, take or apply it as told by your doctor. Do not stop using it even if your condition improves.  To help prevent scarring, make sure to cover your wound with sunscreen whenever you are outside after stitches are removed, after adhesive strips are removed, or when glue remains in place and the wound is healed. Make sure to wear a sunscreen of at least 30 SPF.  Do not scratch or pick at the wound.  Keep all follow-up visits as  told by your health care provider. This is important.  Check your wound every day for signs of infection. Watch for:  Redness, swelling, or pain.  Fluid, blood, or pus.  Raise (elevate) the injured area above the level of your heart while you are sitting or lying down, if possible. SEEK MEDICAL CARE IF:  You received a tetanus shot and you have swelling, severe pain, redness, or bleeding at the injection site.  You have a fever.  A wound that was closed breaks open.  You notice a bad smell coming from your wound or your dressing.  You notice something coming out of the wound, such as wood or glass.  Your pain is not controlled with medicine.  You have increased redness, swelling, or pain at the site of your wound.  You have fluid, blood, or pus coming from your wound.  You notice a change in the color of your skin near your wound.  You need to change the dressing frequently due to fluid, blood, or pus draining from the wound.  You develop a new rash.  You develop numbness around the wound. SEEK IMMEDIATE MEDICAL CARE IF:  You develop severe swelling around the wound.  Your pain suddenly increases and is severe.  You develop painful lumps near the wound or on skin that is anywhere on your body.  You have a red streak going away from your wound.  The wound is on your hand or foot and you cannot properly move a finger or toe.  The wound is on your hand or foot and you notice that your fingers or toes look pale or bluish.   This information is not intended to replace advice given to you by your health care provider. Make sure you discuss any questions you have with your health care provider.   Document Released: 09/03/2005 Document Revised: 01/18/2015 Document Reviewed: 08/30/2014 Elsevier Interactive Patient Education [employer]

## 2015-08-21 NOTE — ED Notes (Signed)
Pt had a cut that was bleeding a fair amount per triage RN (the RN used a quick clot gauze on the cut).  Pt denies any blood thinners or anticoagulants.  Pt is giddy and cold at this time.  Warm blankets give for comfort.  Friend at bedside.

## 2015-08-21 NOTE — ED Notes (Signed)
Pt presents with right hand laceration that she reports injured while opening a wine bottle. Denies being on anticoagulants.

## 2015-08-21 NOTE — ED Provider Notes (Signed)
CSN: [id number]     Arrival date & time 08/21/15  0057 History   First MD Initiated Contact with Patient 08/21/15 [phone]     Chief Complaint  Patient presents with  . Extremity Laceration     (Consider location/radiation/quality/duration/timing/severity/associated sxs/prior Treatment) HPI  Past Medical History  Diagnosis Date  . Anxiety   . Diabetes mellitus without complication (HCC)    History reviewed. No pertinent past surgical history. Family History  Problem Relation Age of Onset  . CAD Other   . Cancer Other   . Hyperlipidemia Mother   . Mental illness Mother   . Hyperlipidemia Father   . Mental illness Brother   . Heart disease Paternal Grandfather   . Hyperlipidemia Paternal Grandfather   . Hypertension Paternal Grandfather    Social History  Substance Use Topics  . Smoking status: Never Smoker   . Smokeless tobacco: None  . Alcohol Use: No     Comment: rare   OB History    No data available     Review of Systems    Allergies  Review of patient's allergies indicates no known allergies.  Home Medications   Prior to Admission medications   Medication Sig Start Date End Date Taking? Authorizing Provider  etonogestrel (NEXPLANON) 68 MG IMPL implant 1 each by Subdermal route once. 08/03/15  Yes Historical Provider, MD  metFORMIN (GLUCOPHAGE) 500 MG tablet Take 500 mg by mouth 2 (two) times daily with a meal.   Yes Historical Provider, MD  naproxen sodium (ANAPROX) 220 MG tablet Take 440 mg by mouth 2 (two) times daily as needed (pain).   Yes Historical Provider, MD  diclofenac (VOLTAREN) 75 MG EC tablet Take 1 tablet (75 mg total) by mouth 2 (two) times daily. Take with food Patient not taking: Reported on 08/21/2015 07/30/15   Domenick GongAshley Mortenson, MD  medroxyPROGESTERone (PROVERA) 10 MG tablet Take 1 tablet (10 mg total) by mouth daily. Patient not taking: Reported on 08/21/2015 07/19/15   Charm RingsErin J Honig, MD  metaxalone (SKELAXIN) 800 MG tablet Take 1 tablet (800  mg total) by mouth 3 (three) times daily. Patient not taking: Reported on 08/21/2015 07/30/15   Domenick GongAshley Mortenson, MD  norgestimate-ethinyl estradiol (SPRINTEC 28) 0.25-35 MG-MCG tablet Take 1 tablet by mouth daily. Patient not taking: Reported on 08/21/2015 07/14/15   Marily MemosJason Mesner, MD  ondansetron (ZOFRAN) 4 MG tablet Take 1 tablet (4 mg total) by mouth every 8 (eight) hours as needed for nausea or vomiting. Patient not taking: Reported on 08/21/2015 07/19/15   Charm RingsErin J Honig, MD  predniSONE (STERAPRED UNI-PAK 21 TAB) 10 MG (21) TBPK tablet Dispense one 6 day pack. Take as directed with food. Patient not taking: Reported on 08/21/2015 07/30/15   Domenick GongAshley Mortenson, MD  traMADol (ULTRAM) 50 MG tablet 1-2 tabs po q 6 hr prn pain Maximum dose= 8 tablets per day Patient not taking: Reported on 08/21/2015 07/30/15   Domenick GongAshley Mortenson, MD   BP 123/64 mmHg  Pulse 86  Temp(Src) 98.1 F (36.7 C)  Resp 19  SpO2 100%  LMP 07/19/2015 (LMP Unknown) Physical Exam  ED Course  .Marland Kitchen.Laceration Repair Date/Time: 08/21/2015 5:10 AM Performed by: Elson AreasSOFIA, Kurk Corniel K Authorized by: Elson AreasSOFIA, Symphany Fleissner K Consent: Verbal consent obtained. Risks and benefits: risks, benefits and alternatives were discussed Consent given by: patient Patient understanding: patient states understanding of the procedure being performed Patient identity confirmed: verbally with patient Body area: upper extremity Location details: right ring finger Foreign bodies: no foreign bodies Tendon involvement:  none Anesthesia: local infiltration Local anesthetic: lidocaine 2% without epinephrine Patient sedated: no Preparation: Patient was prepped and draped in the usual sterile fashion. Irrigation solution: saline Irrigation method: syringe Amount of cleaning: standard Debridement: none Degree of undermining: none Skin closure: 5-0 Prolene Number of sutures: 6 Technique: simple Approximation: loose Approximation difficulty: simple Patient  tolerance: Patient tolerated the procedure well with no immediate complications Comments: 4th fionger 1cm laceration lateral finger,  Explored no foreign body, 3 sutures 5.0 prolene. Palmar aspect of hand 1.8 cm laceration no deep penetration no obvious foreign body 3 sutures 5.0 prolene.  No foreign body on xrays. Pt counseled on possible foreign bodies.  Dr. Blinda Leatherwood will refer to Hand for follow up    (including critical care time) Labs Review Labs Reviewed - No data to display  Imaging Review Dg Hand Complete Right  08/21/2015  CLINICAL DATA:  Laceration EXAM: RIGHT HAND - COMPLETE 3+ VIEW COMPARISON:  None. FINDINGS: Negative for fracture, dislocation or radiopaque foreign body. Dressing material superimposes the hand. IMPRESSION: No bony injury.  No foreign body. Electronically Signed   By: Ellery Plunk M.D.   On: 08/21/2015 03:07   I have personally reviewed and evaluated these images and lab results as part of my medical decision-making.   EKG Interpretation None      MDM   Final diagnoses:  None        Elson Areas, PA-C 08/21/15 [id number]  Gilda Crease, MD 08/21/15 [phone]

## 2015-10-25 ENCOUNTER — Encounter (HOSPITAL_COMMUNITY): Payer: Self-pay

## 2015-10-25 ENCOUNTER — Emergency Department (HOSPITAL_COMMUNITY): Payer: Medicaid Other

## 2015-10-25 ENCOUNTER — Emergency Department (HOSPITAL_COMMUNITY)
Admission: EM | Admit: 2015-10-25 | Discharge: 2015-10-25 | Disposition: A | Discharge status: Home / Self Care | Payer: Medicaid Other | Attending: Emergency Medicine | Admitting: Emergency Medicine

## 2015-10-25 DIAGNOSIS — N946 Dysmenorrhea, unspecified: Secondary | ICD-10-CM | POA: Diagnosis not present

## 2015-10-25 DIAGNOSIS — Z3202 Encounter for pregnancy test, result negative: Secondary | ICD-10-CM | POA: Diagnosis not present

## 2015-10-25 DIAGNOSIS — E119 Type 2 diabetes mellitus without complications: Secondary | ICD-10-CM | POA: Diagnosis not present

## 2015-10-25 DIAGNOSIS — Z7984 Long term (current) use of oral hypoglycemic drugs: Secondary | ICD-10-CM | POA: Diagnosis not present

## 2015-10-25 DIAGNOSIS — R102 Pelvic and perineal pain unspecified side: Secondary | ICD-10-CM

## 2015-10-25 DIAGNOSIS — Z8659 Personal history of other mental and behavioral disorders: Secondary | ICD-10-CM | POA: Insufficient documentation

## 2015-10-25 DIAGNOSIS — R1030 Lower abdominal pain, unspecified: Secondary | ICD-10-CM | POA: Diagnosis present

## 2015-10-25 LAB — WET PREP, GENITAL
Clue Cells Wet Prep HPF POC: NONE SEEN
Sperm: NONE SEEN
Trich, Wet Prep: NONE SEEN
Yeast Wet Prep HPF POC: NONE SEEN

## 2015-10-25 LAB — COMPREHENSIVE METABOLIC PANEL
ALK PHOS: 103 U/L (ref 38–126)
ALT: 29 U/L (ref 14–54)
AST: 26 U/L (ref 15–41)
Albumin: 3.5 g/dL (ref 3.5–5.0)
Anion gap: 8 (ref 5–15)
BILIRUBIN TOTAL: 0.7 mg/dL (ref 0.3–1.2)
BUN: 9 mg/dL (ref 6–20)
CALCIUM: 9.4 mg/dL (ref 8.9–10.3)
CO2: 24 mmol/L (ref 22–32)
CREATININE: 0.63 mg/dL (ref 0.44–1.00)
Chloride: 109 mmol/L (ref 101–111)
GFR calc Af Amer: >60 mL/min (ref >60)
Glucose, Bld: 89 mg/dL (ref 65–99)
Potassium: 4.4 mmol/L (ref 3.5–5.1)
Sodium: 141 mmol/L (ref 135–145)
TOTAL PROTEIN: 6.7 g/dL (ref 6.5–8.1)

## 2015-10-25 LAB — CBC
HEMATOCRIT: 39.7 % (ref 36.0–46.0)
Hemoglobin: 12.1 g/dL (ref 12.0–15.0)
MCH: 22.8 pg — ABNORMAL LOW (ref 26.0–34.0)
MCHC: 30.5 g/dL (ref 30.0–36.0)
MCV: 74.8 fL — AB (ref 78.0–100.0)
PLATELETS: 348 10*3/uL (ref 150–400)
RBC: 5.31 MIL/uL — AB (ref 3.87–5.11)
RDW: 15.5 % (ref 11.5–15.5)
WBC: 8 10*3/uL (ref 4.0–10.5)

## 2015-10-25 LAB — I-STAT BETA HCG BLOOD, ED (MC, WL, AP ONLY): I-stat hCG, quantitative: <5 m[IU]/mL (ref <5)

## 2015-10-25 LAB — URINALYSIS, ROUTINE W REFLEX MICROSCOPIC
Bilirubin Urine: NEGATIVE
GLUCOSE, UA: NEGATIVE mg/dL
Hgb urine dipstick: NEGATIVE
Ketones, ur: NEGATIVE mg/dL
LEUKOCYTES UA: NEGATIVE
NITRITE: NEGATIVE
PROTEIN: NEGATIVE mg/dL
Specific Gravity, Urine: 1.021 (ref 1.005–1.030)
pH: 7 (ref 5.0–8.0)

## 2015-10-25 LAB — LIPASE, BLOOD: Lipase: 32 U/L (ref 11–51)

## 2015-10-25 MED ORDER — MORPHINE SULFATE (PF) 4 MG/ML IV SOLN
4.0000 mg | Freq: Once | INTRAVENOUS | Status: AC
  Administered 2015-10-25: 4 mg via INTRAVENOUS
  Filled 2015-10-25: qty 1

## 2015-10-25 MED ORDER — IOHEXOL 300 MG/ML  SOLN
25.0000 mL | Freq: Once | INTRAMUSCULAR | Status: AC | PRN
  Administered 2015-10-25: 25 mL via ORAL

## 2015-10-25 MED ORDER — OXYCODONE-ACETAMINOPHEN 5-325 MG PO TABS: 2.0000 | ORAL_TABLET | ORAL | Status: DC | PRN

## 2015-10-25 MED ORDER — ONDANSETRON HCL 4 MG/2ML IJ SOLN
4.0000 mg | Freq: Once | INTRAMUSCULAR | Status: AC
  Administered 2015-10-25: 4 mg via INTRAVENOUS
  Filled 2015-10-25: qty 2

## 2015-10-25 MED ORDER — ONDANSETRON 4 MG PO TBDP: 4.0000 mg | ORAL_TABLET | Freq: Three times a day (TID) | ORAL | Status: DC | PRN

## 2015-10-25 MED ORDER — KETOROLAC TROMETHAMINE 30 MG/ML IJ SOLN
30.0000 mg | Freq: Once | INTRAMUSCULAR | Status: AC
  Administered 2015-10-25: 30 mg via INTRAVENOUS
  Filled 2015-10-25: qty 1

## 2015-10-25 MED ORDER — IOHEXOL 300 MG/ML  SOLN
100.0000 mL | Freq: Once | INTRAMUSCULAR | Status: AC | PRN
  Administered 2015-10-25: 100 mL via INTRAVENOUS

## 2015-10-25 MED ORDER — SODIUM CHLORIDE 0.9 % IV BOLUS (SEPSIS)
1000.0000 mL | Freq: Once | INTRAVENOUS | Status: AC
  Administered 2015-10-25: 1000 mL via INTRAVENOUS

## 2015-10-25 NOTE — ED Notes (Signed)
Pt states since yesterday, cough, congestion, emesis, abdominal pain, nausea.  Went to urgent care at school and told to come here.

## 2015-10-25 NOTE — ED Notes (Signed)
Patient transported to CT 

## 2015-10-25 NOTE — Discharge Instructions (Signed)
Dysmenorrhea Dysmenorrhea is pain during a menstrual period. You will have pain in the lower belly (abdomen). The pain is caused by the tightening (contracting) of the muscles of the uterus. The pain can be minor or severe. Headache, feeling sick to your stomach (nausea), throwing up (vomiting), or low back pain may occur with this condition. HOME CARE  Only take medicine as told by your doctor.  Place a heating pad or hot water bottle on your lower back or belly. Do not sleep with a heating pad.  Exercise may help lessen the pain.  Massage the lower back or belly.  Stop smoking.  Avoid alcohol and caffeine. GET HELP IF:   Your pain does not get better with medicine.  You have pain during sex.  Your pain gets worse while taking pain medicine.  Your period bleeding is heavier than normal.  You keep feeling sick to your stomach or keep throwing up. GET HELP RIGHT AWAY IF: You pass out (faint).   This information is not intended to replace advice given to you by your health care provider. Make sure you discuss any questions you have with your health care provider.   Document Released: 11/30/2008 Document Revised: 09/08/2013 Document Reviewed: 02/19/2013 Elsevier Interactive Patient Education 2016 Else[employer] Inc[employer]  Pelvic Pain, Female Pelvic pain is pain felt below the belly button and between your hips. It can be caused by many different things. It is important to get help right away. This is especially true for severe, sharp, or unusual pain that comes on suddenly.  HOME CARE  Only take medicine as told by your doctor.  Rest as told by your doctor.  Eat a healthy diet, such as fruits, vegetables, and lean meats.  Drink enough fluids to keep your pee (urine) clear or pale yellow, or as told.  Avoid sex (intercourse) if it causes pain.  Apply warm or cold packs to your lower belly (abdomen). Use the type of pack that helps the pain.  Avoid situations that cause you  stress.  Keep a journal to track your pain. Write down:  When the pain started.  Where it is located.  If there are things that seem to be related to the pain, such as food or your period.  Follow up with your doctor as told. GET HELP RIGHT AWAY IF:   You have heavy bleeding from the vagina.  You have more pelvic pain.  You feel lightheaded or pass out (faint).  You have chills.  You have pain when you pee or have blood in your pee.  You cannot stop having watery poop (diarrhea).  You cannot stop throwing up (vomiting).  You have a fever or lasting symptoms for more than 3 days.  You have a fever and your symptoms suddenly get worse.  You are being physically or sexually abused.  Your medicine does not help your pain.  You have fluid (discharge) coming from your vagina that is not normal. MAKE SURE YOU:  Understand these instructions.  Will watch your condition.  Will get help if you are not doing well or get worse.   This information is not intended to replace advice given to you by your health care provider. Make sure you discuss any questions you have with your health care provider.    Follow up with OBGYN for re-evaluation. Return to the ED if you experience worsening of your symptoms, vomiting, fevers, vaginal bleeding, vaginal discharge, blood in stool, burning with urination.

## 2015-10-26 LAB — GC/CHLAMYDIA PROBE AMP (~~LOC~~) NOT AT ARMC
Chlamydia: NEGATIVE
Neisseria Gonorrhea: NEGATIVE

## 2015-10-26 NOTE — ED Provider Notes (Signed)
CSN: [id number]     Arrival date & time 10/25/15  1032 History   First MD Initiated Contact with Patient 10/25/15 1151     Chief Complaint  Patient presents with  . Abdominal Pain  . Emesis     (Consider location/radiation/quality/duration/timing/severity/associated sxs/prior Treatment) HPI   Ashlee Thompson is a 20 y.o F with a pmhx of DM, severe menorrhagia requiring previous blood transfusions who presents to the emergency department today complaining of lower abdominal pain and pelvic pressure. Patient states that last night she began having severe lower abdominal cramping and nausea. The pain eventually subsided and the patient was able to go to sleep. However when she woke up this morning he abdominal cramping was much more severe and is not radiating to her back. She had one episode of nonbloody, nonbilious emesis. Patient states that this pain is similar to abdominal cramping that she has had in the past associated with her dysmenorrhea and menorrhagia. Patient states that one month ago she had the nexpanon implanted and has not had a period since. Prior to that she had vaginal bleeding for 3 months after getting off of the Depo shot. No vaginal bleeding noted at this time. Patient was seen by her [facility] urgent care and was sent to the emergency department for further evaluation. Patient denies melena, hematochezia, fever, chills, vaginal discharge, dysuria, paresthesias, shortness of breath, lower extremity swelling. Patient is sexually active with one partner.  Past Medical History  Diagnosis Date  . Anxiety   . Diabetes mellitus without complication (HCC)    History reviewed. No pertinent past surgical history. Family History  Problem Relation Age of Onset  . CAD Other   . Cancer Other   . Hyperlipidemia Mother   . Mental illness Mother   . Hyperlipidemia Father   . Mental illness Brother   . Heart disease Paternal Grandfather   . Hyperlipidemia Paternal Grandfather   .  Hypertension Paternal Grandfather    Social History  Substance Use Topics  . Smoking status: Never Smoker   . Smokeless tobacco: None  . Alcohol Use: No     Comment: rare   OB History    No data available     Review of Systems  All other systems reviewed and are negative.     Allergies  Review of patient's allergies indicates no known allergies.  Home Medications   Prior to Admission medications   Medication Sig Start Date End Date Taking? Authorizing Provider  etonogestrel (NEXPLANON) 68 MG IMPL implant 1 each by Subdermal route once. 08/03/15  Yes Historical Provider, MD  metFORMIN (GLUCOPHAGE) 500 MG tablet Take 500 mg by mouth 2 (two) times daily with a meal.   Yes Historical Provider, MD  diclofenac (VOLTAREN) 75 MG EC tablet Take 1 tablet (75 mg total) by mouth 2 (two) times daily. Take with food Patient not taking: Reported on 08/21/2015 07/30/15   Domenick Gong, MD  HYDROcodone-acetaminophen (NORCO/VICODIN) 5-325 MG tablet Take 2 tablets by mouth every 4 (four) hours as needed for moderate pain. 08/21/15   Gilda Crease, MD  medroxyPROGESTERone (PROVERA) 10 MG tablet Take 1 tablet (10 mg total) by mouth daily. Patient not taking: Reported on 08/21/2015 07/19/15   Charm Rings, MD  metaxalone (SKELAXIN) 800 MG tablet Take 1 tablet (800 mg total) by mouth 3 (three) times daily. Patient not taking: Reported on 08/21/2015 07/30/15   Domenick Gong, MD  norgestimate-ethinyl estradiol (SPRINTEC 28) 0.25-35 MG-MCG tablet Take 1 tablet by mouth  daily. Patient not taking: Reported on 08/21/2015 07/14/15   [name], MD  ondansetron (ZOFRAN ODT) 4 MG disintegrating tablet Take 1 tablet (4 mg total) by mouth every 8 (eight) hours as needed for nausea or vomiting. 10/25/15   Zakyla Tonche Tripp Esley Brooking, PA-C  ondansetron (ZOFRAN) 4 MG tablet Take 1 tablet (4 mg total) by mouth every 8 (eight) hours as needed for nausea or vomiting. Patient not taking: Reported on 08/21/2015  07/19/15   Charm Rings, MD  oxyCODONE-acetaminophen (PERCOCET/ROXICET) 5-325 MG tablet Take 2 tablets by mouth every 4 (four) hours as needed for severe pain. 10/25/15   Jhovani Griswold Tripp Darien Kading, PA-C  predniSONE (STERAPRED UNI-PAK 21 TAB) 10 MG (21) TBPK tablet Dispense one 6 day pack. Take as directed with food. Patient not taking: Reported on 08/21/2015 07/30/15   Domenick Gong, MD  traMADol (ULTRAM) 50 MG tablet 1-2 tabs po q 6 hr prn pain Maximum dose= 8 tablets per day Patient not taking: Reported on 08/21/2015 07/30/15   Domenick Gong, MD   BP 122/74 mmHg  Pulse 88  Temp(Src) 98.2 F (36.8 C) (Oral)  Resp 18  SpO2 100% Physical Exam  Constitutional: She is oriented to person, place, and time. She appears well-developed and well-nourished. No distress.  HENT:  Head: Normocephalic and atraumatic.  Mouth/Throat: No oropharyngeal exudate.  Eyes: Conjunctivae and EOM are normal. Pupils are equal, round, and reactive to light. Right eye exhibits no discharge. Left eye exhibits no discharge. No scleral icterus.  Cardiovascular: Normal rate, regular rhythm, normal heart sounds and intact distal pulses.  Exam reveals no gallop and no friction rub.   No murmur heard. Pulmonary/Chest: Effort normal and breath sounds normal. No respiratory distress. She has no wheezes. She has no rales. She exhibits no tenderness.  Abdominal: Soft. She exhibits no distension and no mass. There is tenderness ( suprapubic TTP). There is no rebound and no guarding.  No peritoneal signs. No CVA tenderness.  Genitourinary: There is no tenderness on the right labia. There is no tenderness on the left labia. Uterus is tender. Cervix exhibits no motion tenderness. Right adnexum displays no mass. Left adnexum displays no mass. No bleeding in the vagina. Vaginal discharge found.  Musculoskeletal: Normal range of motion. She exhibits no edema.  Lymphadenopathy:       Right: No inguinal adenopathy present.       Left: No  inguinal adenopathy present.  Neurological: She is alert and oriented to person, place, and time.  Skin: Skin is warm and dry. No rash noted. She is not diaphoretic. No erythema. No pallor.  Psychiatric: She has a normal mood and affect. Her behavior is normal.  Nursing note and vitals reviewed.   ED Course  Procedures (including critical care time) Labs Review Labs Reviewed  WET PREP, GENITAL - Abnormal; Notable for the following:    WBC, Wet Prep HPF POC FEW (*)    All other components within normal limits  CBC - Abnormal; Notable for the following:    RBC 5.31 (*)    MCV 74.8 (*)    MCH 22.8 (*)    All other components within normal limits  LIPASE, BLOOD  COMPREHENSIVE METABOLIC PANEL  URINALYSIS, ROUTINE W REFLEX MICROSCOPIC (NOT AT [facility])  I-STAT BETA HCG BLOOD, ED ([facility], [facility], [facility] ONLY)  GC/CHLAMYDIA PROBE AMP () NOT AT [facility]    Imaging Review Ct Abdomen Pelvis W Contrast  10/25/2015  CLINICAL DATA:  Abdominal pain with nausea and emesis EXAM: CT  ABDOMEN AND PELVIS WITH CONTRAST TECHNIQUE: Multidetector CT imaging of the abdomen and pelvis was performed using the standard protocol following bolus administration of intravenous contrast. Oral contrast was also administered. CONTRAST:  OMNIPAQUE IOHEXOL 300 MG/ML SOLN, 25mL OMNIPAQUE IOHEXOL 300 MG/ML SOLN COMPARISON:  None. FINDINGS: Lower chest:  Lung bases are clear. Hepatobiliary: No focal liver lesions are identified. Gallbladder wall is not appreciably thickened. There is no biliary duct dilatation. Pancreas: There is no pancreatic mass or inflammatory focus. Spleen: No splenic lesions are identified. Adrenals/Urinary Tract: No adrenal lesions are appreciable. Kidneys bilaterally show no mass or hydronephrosis on either side. There is no renal or ureteral calculus on either side. Urinary bladder is midline with wall thickness within normal limits. Stomach/Bowel: There is no bowel wall or mesenteric thickening. No bowel  obstruction. No free air or portal venous air. There are occasional sigmoid diverticula without diverticulitis. Vascular/Lymphatic: There is no abdominal aortic aneurysm. No vascular lesions are appreciable. There is no adenopathy in the abdomen or pelvis. Reproductive: The uterus is anteverted and normal in size and contour. There is no pelvic mass or pelvic fluid collection. Other: There is a small ventral hernia containing only fat. The appendix appears normal. No abscess or ascites in the abdomen or pelvis. Musculoskeletal: There are no blastic or lytic bone lesions. No intramuscular or abdominal wall lesion. IMPRESSION: A cause for patient's symptoms has not been established with this study. There is no bowel obstruction or bowel wall thickening. Appendix appears normal. No abscess. No renal or ureteral calculus. No hydronephrosis. There are occasional sigmoid diverticula without diverticulitis. There is a small ventral hernia containing only fat. Electronically Signed   By: Bretta Bang III M.D.   On: 10/25/2015 14:19   I have personally reviewed and evaluated these images and lab results as part of my medical decision-making.   EKG Interpretation None      MDM   Final diagnoses:  Pelvic pain in female  Dysmenorrhea    20 year old female with long history of dysmenorrhea and menorrhagia, previously requiring blood transfusions presents with lower abdominal cramping and pelvic pressure. On presentation patient appears to be uncomfortable, but nontoxic and nonseptic appearing. Afebrile. All vital signs are stable. No vaginal bleeding at this time. Patient had nexplanon placed 1 month ago and has not had a menstrual cycle since. One episode NBNB emesis earlier today. Patient given IV fluids, pain medication and Zofran which significantly relieved her symptoms. Hemoglobin stable at 12.1. UA negative for infection. Pregnancy test negative. No electrolyte abnormalities. No leukocytosis. On  pelvic exam patient experienced tenderness on bimanual exam over her uterus. No vaginal discharge. Patient is in a monogamous relationship. Doubt STD or PID. CT abdomen is negative for acute abnormality. Suspect patient's pain is due to a gynecological etiology. Possibly adenomyosis, endometriosis. Until this month patient has had significant vaginal bleeding for many years. Patient may require contraception medication adjustment.   On repeat exam, patient's abdomen is soft, nontender. No sign of appendicitis, diverticulitis, cholecystitis or other acute abdomen. Patient's symptoms much improved. Will DC home with pain medication and Zofran, follow up with OB/GYN. Patient is hemodynamically stable and ready for discharge. Return precautions outlined in patient discharge instructions.    Dub Mikes, PA-C 10/26/15 1044  Melene Plan, DO 10/26/15 [phone]

## 2015-11-10 ENCOUNTER — Emergency Department (INDEPENDENT_AMBULATORY_CARE_PROVIDER_SITE_OTHER)
Admission: EM | Admit: 2015-11-10 | Discharge: 2015-11-10 | Disposition: A | Discharge status: Home / Self Care | Payer: Medicaid Other | Source: Home / Self Care | Attending: Emergency Medicine | Admitting: Emergency Medicine

## 2015-11-10 ENCOUNTER — Encounter (HOSPITAL_COMMUNITY): Payer: Self-pay | Admitting: Emergency Medicine

## 2015-11-10 DIAGNOSIS — N898 Other specified noninflammatory disorders of vagina: Secondary | ICD-10-CM | POA: Diagnosis not present

## 2015-11-10 DIAGNOSIS — R1030 Lower abdominal pain, unspecified: Secondary | ICD-10-CM | POA: Diagnosis not present

## 2015-11-10 LAB — POCT URINALYSIS DIP (DEVICE)
Bilirubin Urine: NEGATIVE
Glucose, UA: NEGATIVE mg/dL
KETONES UR: NEGATIVE mg/dL
Leukocytes, UA: NEGATIVE
Nitrite: NEGATIVE
PH: 7 (ref 5.0–8.0)
PROTEIN: NEGATIVE mg/dL
SPECIFIC GRAVITY, URINE: 1.025 (ref 1.005–1.030)
Urobilinogen, UA: 0.2 mg/dL (ref 0.0–1.0)

## 2015-11-10 MED ORDER — DOXYCYCLINE HYCLATE 100 MG PO CAPS: 100.0000 mg | ORAL_CAPSULE | Freq: Two times a day (BID) | ORAL | Status: DC

## 2015-11-10 MED ORDER — MELOXICAM 15 MG PO TABS: 15.0000 mg | ORAL_TABLET | Freq: Every day | ORAL | Status: DC

## 2015-11-10 MED ORDER — HYDROCODONE-ACETAMINOPHEN 5-325 MG PO TABS: 1.0000 | ORAL_TABLET | ORAL | Status: DC | PRN

## 2015-11-10 MED ORDER — METRONIDAZOLE 500 MG PO TABS: 500.0000 mg | ORAL_TABLET | Freq: Two times a day (BID) | ORAL | Status: DC

## 2015-11-10 NOTE — Discharge Instructions (Signed)
We are going to treat you for a possible infection. Take doxycycline and Flagyl twice a day for 7 days. Take meloxicam daily to help with crampy pain. Use the hydrocodone every 4 hours as needed for severe pain. Do not drive while taking this medicine. If this is not improving in the next 2 days, please go to [facility] for additional evaluation.

## 2015-11-10 NOTE — ED Notes (Signed)
Pt here today for severe lower abdominal pain.  Pt holding her abdomen and rocking back and forth.  Pt states it started about 2 1/2 weeks ago.  She was seen in the ED on 2/7 and then followed up with her gynecologist today.  She reports being unable to have the speculum placed inside her so the gyn told her she was unable to treat her for pain since she didn't know what was wrong with her.  Pt has a reported history of ovarian cysts and has been told that maybe she has had a cyst rupture.  She does not have any external bleeding but states she will see pink on the toilet paper if she wipes inside herself.

## 2015-11-10 NOTE — ED Provider Notes (Signed)
CSN: [id number]     Arrival date & time 11/10/15  1607 History   First MD Initiated Contact with Patient 11/10/15 1717     Chief Complaint  Patient presents with  . Abdominal Pain   (Consider location/radiation/quality/duration/timing/severity/associated sxs/prior Treatment) HPI  She is a 20 year old woman here for evaluation of lower abdominal pain. She states this started about 2-1/2 weeks ago with intermittent lower abdominal cramping. She was taking some medication at home, but it was not improving so she went to the emergency room.  She also reports a fever of 105 the day before going to the ER.  At that time she was diagnosed with a likely ruptured ovarian cyst and given symptomatic treatment. She is also tested for STDs at that time and was negative. She states she has continued to have worsening lower abdominal pain. It is crampy. She denies any vaginal discharge, dysuria, constipation. She does report some loose stools. The pain will make her nauseous, but she has not thrown up. She has been taking Zofran with good control of the nausea. She does state that when she urinates the pressure in her lower abdomen will be temporarily relieved. She reports feeling bloated, and her underwear do not fit comfortably. She saw her OB/GYN today, but was unable to tolerate a speculum or bimanual exam so she was told to go to urgent care or the emergency room.  She has not had sex since her recent STD testing.  Past Medical History  Diagnosis Date  . Anxiety   . Diabetes mellitus without complication (HCC)    History reviewed. No pertinent past surgical history. Family History  Problem Relation Age of Onset  . CAD Other   . Cancer Other   . Hyperlipidemia Mother   . Mental illness Mother   . Hyperlipidemia Father   . Mental illness Brother   . Heart disease Paternal Grandfather   . Hyperlipidemia Paternal Grandfather   . Hypertension Paternal Grandfather    Social History  Substance Use  Topics  . Smoking status: Never Smoker   . Smokeless tobacco: None  . Alcohol Use: No     Comment: rare   OB History    No data available     Review of Systems As in history of present illness Allergies  Review of patient's allergies indicates no known allergies.  Home Medications   Prior to Admission medications   Medication Sig Start Date End Date Taking? Authorizing Provider  metFORMIN (GLUCOPHAGE) 500 MG tablet Take 500 mg by mouth 2 (two) times daily with a meal.   Yes Historical Provider, MD  doxycycline (VIBRAMYCIN) 100 MG capsule Take 1 capsule (100 mg total) by mouth 2 (two) times daily. 11/10/15   Charm Rings, MD  etonogestrel (NEXPLANON) 68 MG IMPL implant 1 each by Subdermal route once. 08/03/15   Historical Provider, MD  HYDROcodone-acetaminophen (NORCO) 5-325 MG tablet Take 1 tablet by mouth every 4 (four) hours as needed for moderate pain. 11/10/15   Charm Rings, MD  meloxicam (MOBIC) 15 MG tablet Take 1 tablet (15 mg total) by mouth daily. 11/10/15   Charm Rings, MD  metroNIDAZOLE (FLAGYL) 500 MG tablet Take 1 tablet (500 mg total) by mouth 2 (two) times daily. 11/10/15   Charm Rings, MD  ondansetron (ZOFRAN ODT) 4 MG disintegrating tablet Take 1 tablet (4 mg total) by mouth every 8 (eight) hours as needed for nausea or vomiting. 10/25/15   Samantha Tripp Dowless, PA-C  oxyCODONE-acetaminophen (  PERCOCET/ROXICET) 5-325 MG tablet Take 2 tablets by mouth every 4 (four) hours as needed for severe pain. 10/25/15   Samantha Tripp Dowless, PA-C   Meds Ordered and Administered this Visit  Medications - No data to display  BP 118/88 mmHg  Pulse 95  Temp(Src) 98.5 F (36.9 C) (Oral)  Resp 20  SpO2 97% No data found.   Physical Exam  Constitutional: She is oriented to person, place, and time. She appears well-developed and well-nourished. No distress.  Cardiovascular: Normal rate, regular rhythm and normal heart sounds.   Pulmonary/Chest: Effort normal.  Abdominal: Soft.  Bowel sounds are normal. She exhibits no distension and no mass. There is tenderness (across lower abdomen). There is no rebound and no guarding.  Genitourinary: Uterus is tender. Cervix exhibits motion tenderness. Discharge: unable to due to pain with exam. No bleeding in the vagina. No foreign body around the vagina. Vaginal discharge (Yellowish discharge) found.  Neurological: She is alert and oriented to person, place, and time.    ED Course  Procedures (including critical care time)  Labs Review Labs Reviewed  POCT URINALYSIS DIP (DEVICE) - Abnormal; Notable for the following:    Hgb urine dipstick MODERATE (*)    All other components within normal limits    Imaging Review No results found.    MDM   1. Lower abdominal pain   2. Vaginal discharge    We'll treat presumptively for PID with doxycycline and Flagyl. Meloxicam and hydrocodone as needed for pain. If symptoms are not improving in the next 2 days, she should follow-up at [facility].    Charm Rings, MD 11/10/15 1806

## 2015-12-20 ENCOUNTER — Encounter: Payer: Self-pay | Admitting: Obstetrics & Gynecology

## 2016-01-22 ENCOUNTER — Encounter (HOSPITAL_COMMUNITY): Payer: Self-pay

## 2016-01-22 ENCOUNTER — Emergency Department (HOSPITAL_COMMUNITY)
Admission: EM | Admit: 2016-01-22 | Discharge: 2016-01-23 | Disposition: A | Discharge status: Home / Self Care | Payer: Medicaid Other | Attending: Emergency Medicine | Admitting: Emergency Medicine

## 2016-01-22 DIAGNOSIS — E119 Type 2 diabetes mellitus without complications: Secondary | ICD-10-CM | POA: Diagnosis not present

## 2016-01-22 DIAGNOSIS — Z791 Long term (current) use of non-steroidal anti-inflammatories (NSAID): Secondary | ICD-10-CM | POA: Insufficient documentation

## 2016-01-22 DIAGNOSIS — N760 Acute vaginitis: Secondary | ICD-10-CM | POA: Diagnosis not present

## 2016-01-22 DIAGNOSIS — Z3202 Encounter for pregnancy test, result negative: Secondary | ICD-10-CM | POA: Insufficient documentation

## 2016-01-22 DIAGNOSIS — F17210 Nicotine dependence, cigarettes, uncomplicated: Secondary | ICD-10-CM | POA: Diagnosis not present

## 2016-01-22 DIAGNOSIS — R197 Diarrhea, unspecified: Secondary | ICD-10-CM | POA: Diagnosis not present

## 2016-01-22 DIAGNOSIS — F419 Anxiety disorder, unspecified: Secondary | ICD-10-CM | POA: Diagnosis not present

## 2016-01-22 DIAGNOSIS — Z7984 Long term (current) use of oral hypoglycemic drugs: Secondary | ICD-10-CM | POA: Insufficient documentation

## 2016-01-22 DIAGNOSIS — R110 Nausea: Secondary | ICD-10-CM | POA: Diagnosis not present

## 2016-01-22 DIAGNOSIS — R1031 Right lower quadrant pain: Secondary | ICD-10-CM | POA: Diagnosis present

## 2016-01-22 DIAGNOSIS — Z792 Long term (current) use of antibiotics: Secondary | ICD-10-CM | POA: Diagnosis not present

## 2016-01-22 DIAGNOSIS — R0789 Other chest pain: Secondary | ICD-10-CM | POA: Diagnosis not present

## 2016-01-22 DIAGNOSIS — B9689 Other specified bacterial agents as the cause of diseases classified elsewhere: Secondary | ICD-10-CM

## 2016-01-22 DIAGNOSIS — Z79899 Other long term (current) drug therapy: Secondary | ICD-10-CM | POA: Insufficient documentation

## 2016-01-22 LAB — URINALYSIS, ROUTINE W REFLEX MICROSCOPIC
Bilirubin Urine: NEGATIVE
Glucose, UA: NEGATIVE mg/dL
Hgb urine dipstick: NEGATIVE
Ketones, ur: NEGATIVE mg/dL
Leukocytes, UA: NEGATIVE
Nitrite: NEGATIVE
Protein, ur: NEGATIVE mg/dL
Specific Gravity, Urine: 1.028 (ref 1.005–1.030)
pH: 6 (ref 5.0–8.0)

## 2016-01-22 LAB — CBC
HCT: 39.9 % (ref 36.0–46.0)
Hemoglobin: 12.7 g/dL (ref 12.0–15.0)
MCH: 23 pg — ABNORMAL LOW (ref 26.0–34.0)
MCHC: 31.8 g/dL (ref 30.0–36.0)
MCV: 72.4 fL — ABNORMAL LOW (ref 78.0–100.0)
Platelets: 337 10*3/uL (ref 150–400)
RBC: 5.51 MIL/uL — ABNORMAL HIGH (ref 3.87–5.11)
RDW: 14.7 % (ref 11.5–15.5)
WBC: 11.1 10*3/uL — ABNORMAL HIGH (ref 4.0–10.5)

## 2016-01-22 LAB — I-STAT BETA HCG BLOOD, ED (MC, WL, AP ONLY): I-stat hCG, quantitative: <5 m[IU]/mL (ref <5)

## 2016-01-22 LAB — COMPREHENSIVE METABOLIC PANEL
ALT: 26 U/L (ref 14–54)
AST: 26 U/L (ref 15–41)
Albumin: 4.1 g/dL (ref 3.5–5.0)
Alkaline Phosphatase: 110 U/L (ref 38–126)
Anion gap: 10 (ref 5–15)
BUN: 13 mg/dL (ref 6–20)
CO2: 25 mmol/L (ref 22–32)
Calcium: 9.6 mg/dL (ref 8.9–10.3)
Chloride: 107 mmol/L (ref 101–111)
Creatinine, Ser: 0.75 mg/dL (ref 0.44–1.00)
GFR calc Af Amer: >60 mL/min (ref >60)
GFR calc non Af Amer: >60 mL/min (ref >60)
Glucose, Bld: 98 mg/dL (ref 65–99)
Potassium: 3.9 mmol/L (ref 3.5–5.1)
Sodium: 142 mmol/L (ref 135–145)
Total Bilirubin: 0.6 mg/dL (ref 0.3–1.2)
Total Protein: 7.5 g/dL (ref 6.5–8.1)

## 2016-01-22 LAB — LIPASE, BLOOD: Lipase: 52 U/L — ABNORMAL HIGH (ref 11–51)

## 2016-01-22 MED ORDER — ONDANSETRON 4 MG PO TBDP: 4.0000 mg | ORAL_TABLET | Freq: Once | ORAL | Status: DC | PRN

## 2016-01-22 MED ORDER — MORPHINE SULFATE (PF) 4 MG/ML IV SOLN
4.0000 mg | Freq: Once | INTRAVENOUS | Status: AC
  Administered 2016-01-22: 4 mg via INTRAVENOUS
  Filled 2016-01-22: qty 1

## 2016-01-22 MED ORDER — ONDANSETRON 4 MG PO TBDP: 4.0000 mg | ORAL_TABLET | Freq: Once | ORAL | Status: DC

## 2016-01-22 MED ORDER — SODIUM CHLORIDE 0.9 % IV BOLUS (SEPSIS)
1000.0000 mL | Freq: Once | INTRAVENOUS | Status: AC
  Administered 2016-01-22: 1000 mL via INTRAVENOUS

## 2016-01-22 MED ORDER — ONDANSETRON HCL 4 MG/2ML IJ SOLN
4.0000 mg | Freq: Once | INTRAMUSCULAR | Status: AC
  Administered 2016-01-22: 4 mg via INTRAVENOUS
  Filled 2016-01-22: qty 2

## 2016-01-22 NOTE — ED Notes (Signed)
Patient c/o right lower abdominal pain that radiates toward the right flank.  Patient states that pain began today at 1830 and has been intermittent.  Patient complains of nausea, denies vomiting, denies diarrhea.  Patient rates pain 6/10.

## 2016-01-22 NOTE — ED Notes (Signed)
Pelvic at bedside   

## 2016-01-23 ENCOUNTER — Encounter (HOSPITAL_COMMUNITY): Payer: Self-pay | Admitting: Radiology

## 2016-01-23 ENCOUNTER — Emergency Department (HOSPITAL_COMMUNITY): Payer: Medicaid Other

## 2016-01-23 LAB — WET PREP, GENITAL
SPERM: NONE SEEN
TRICH WET PREP: NONE SEEN
YEAST WET PREP: NONE SEEN

## 2016-01-23 LAB — GC/CHLAMYDIA PROBE AMP (~~LOC~~) NOT AT ARMC
Chlamydia: NEGATIVE
NEISSERIA GONORRHEA: NEGATIVE

## 2016-01-23 MED ORDER — ONDANSETRON 4 MG PO TBDP: 4.0000 mg | ORAL_TABLET | Freq: Three times a day (TID) | ORAL | Status: DC | PRN

## 2016-01-23 MED ORDER — METRONIDAZOLE 500 MG PO TABS: 500.0000 mg | ORAL_TABLET | Freq: Two times a day (BID) | ORAL | Status: DC

## 2016-01-23 MED ORDER — IOPAMIDOL (ISOVUE-300) INJECTION 61%
100.0000 mL | Freq: Once | INTRAVENOUS | Status: AC | PRN
  Administered 2016-01-23: 100 mL via INTRAVENOUS

## 2016-01-23 NOTE — Discharge Instructions (Signed)
1. Medications: Please take all of your antibiotics until finished!, continue usual home medications 2. Treatment: rest, drink plenty of fluids, use a condom with every sexual encounter 3. Follow Up: Please follow up with your primary doctor or OBGYN in 1 week for discussion of your diagnoses and further evaluation after today's visit; Please return to the ER for worsening symptoms, high fevers or persistent vomiting.   SEEK IMMEDIATE MEDICAL CARE IF:  You develop an oral temperature above 102 F (38.9 C), not controlled by medications or lasting more than 2 days.  You develop an increase in pain.  You develop vaginal bleeding and it is not time for your period.

## 2016-01-23 NOTE — ED Provider Notes (Signed)
CSN: [id number]     Arrival date & time 01/22/16  2135 History   First MD Initiated Contact with Patient 01/22/16 2207     Chief Complaint  Patient presents with  . Abdominal Pain  . Flank Pain     (Consider location/radiation/quality/duration/timing/severity/associated sxs/prior Treatment) HPI   Patient is a 20 year old female with a past medical history of diabetes and anxiety who presents the emergency department with worsening right lower quadrant pain that started at 1800 today. She states the pain started while she was at work lasted roughly 10 minutes and went away then returned about 30 minutes later and was intermittent until roughly 1 hour ago when it became constant. She describes the pain as constant, throbbing, 7/10, that radiates around her side to her right lower back. She is not taking anything for the pain she states movement makes it worse. She states pain was worse when riding in the car. Pain feels better when she leans forward. She also endorses chest pressure worse with deep inspiration. She has associated nausea and 3 episodes of diarrhea today. She denies vomiting, hematochezia, dysuria, hematuria, vaginal discharge, headache, shortness of breath  Past Medical History  Diagnosis Date  . Anxiety   . Diabetes mellitus without complication (HCC)    History reviewed. No pertinent past surgical history. Family History  Problem Relation Age of Onset  . CAD Other   . Cancer Other   . Hyperlipidemia Mother   . Mental illness Mother   . Hyperlipidemia Father   . Mental illness Brother   . Heart disease Paternal Grandfather   . Hyperlipidemia Paternal Grandfather   . Hypertension Paternal Grandfather    Social History  Substance Use Topics  . Smoking status: Current Some Day Smoker    Types: Cigarettes, Cigars  . Smokeless tobacco: None  . Alcohol Use: No     Comment: rare   OB History    No data available     Review of Systems  Constitutional: Negative for  fever and chills.  HENT: Negative for trouble swallowing.   Eyes: Negative for visual disturbance.  Respiratory: Positive for chest tightness. Negative for cough and shortness of breath.   Cardiovascular: Negative for chest pain.  Gastrointestinal: Positive for nausea, abdominal pain and diarrhea. Negative for vomiting, constipation, blood in stool and abdominal distention.  Genitourinary: Negative for dysuria, hematuria, vaginal bleeding, vaginal discharge and vaginal pain.  Musculoskeletal: Negative for myalgias, joint swelling and arthralgias.  Skin: Negative for rash.  Neurological: Negative for dizziness, syncope, weakness, light-headedness, numbness and headaches.  Psychiatric/Behavioral: Negative for confusion.      Allergies  Tramadol  Home Medications   Prior to Admission medications   Medication Sig Start Date End Date Taking? Authorizing Provider  etonogestrel (NEXPLANON) 68 MG IMPL implant 1 each by Subdermal route once. 08/03/15  Yes Historical Provider, MD  FLUoxetine (PROZAC) 10 MG capsule Take 10 mg by mouth daily.   Yes Historical Provider, MD  hydrOXYzine (ATARAX/VISTARIL) 25 MG tablet Take 25 mg by mouth 3 (three) times daily as needed for anxiety.   Yes Historical Provider, MD  metFORMIN (GLUCOPHAGE) 500 MG tablet Take 500 mg by mouth 2 (two) times daily with a meal.   Yes Historical Provider, MD  doxycycline (VIBRAMYCIN) 100 MG capsule Take 1 capsule (100 mg total) by mouth 2 (two) times daily. 11/10/15   Charm Rings, MD  HYDROcodone-acetaminophen (NORCO) 5-325 MG tablet Take 1 tablet by mouth every 4 (four) hours as needed for  moderate pain. 11/10/15   Charm RingsErin J Honig, MD  meloxicam (MOBIC) 15 MG tablet Take 1 tablet (15 mg total) by mouth daily. 11/10/15   Charm RingsErin J Honig, MD  metroNIDAZOLE (FLAGYL) 500 MG tablet Take 1 tablet (500 mg total) by mouth 2 (two) times daily. 11/10/15   Charm RingsErin J Honig, MD  ondansetron (ZOFRAN ODT) 4 MG disintegrating tablet Take 1 tablet (4 mg  total) by mouth every 8 (eight) hours as needed for nausea or vomiting. 10/25/15   Samantha Tripp Dowless, PA-C  oxyCODONE-acetaminophen (PERCOCET/ROXICET) 5-325 MG tablet Take 2 tablets by mouth every 4 (four) hours as needed for severe pain. 10/25/15   Samantha Tripp Dowless, PA-C   BP 155/133 mmHg  Pulse 88  Temp(Src) 98.1 F (36.7 C) (Oral)  Resp 14  SpO2 100% Physical Exam  Constitutional: She is oriented to person, place, and time. She appears well-developed and well-nourished. No distress.  HENT:  Head: Normocephalic and atraumatic.  Eyes: Conjunctivae are normal.  Neck: Normal range of motion.  Cardiovascular: Normal rate, regular rhythm and normal heart sounds.   Pulses:      Radial pulses are 2+ on the right side, and 2+ on the left side.       Dorsalis pedis pulses are 2+ on the right side, and 2+ on the left side.  Pulmonary/Chest: Effort normal and breath sounds normal.  Abdominal: Soft. Normal appearance and bowel sounds are normal. She exhibits no distension. There is tenderness in the right lower quadrant. There is rebound and guarding. There is no CVA tenderness.  Genitourinary: Vagina normal. There is no rash or lesion on the right labia. There is no rash or lesion on the left labia. No erythema in the vagina.  Scant amounts of milky white discharge noted, no cervical motion tenderness, no TTP of the uterus, ovaries not palpable due to body habitus  Neurological: She is alert and oriented to person, place, and time. Coordination normal.  Skin: Skin is warm and dry. No rash noted. She is not diaphoretic.  Psychiatric: She has a normal mood and affect. Her behavior is normal.    ED Course  Procedures (including critical care time)  0140: Pt states chest pressure is gone and abd pain is now 2/10.  Labs Review Labs Reviewed  WET PREP, GENITAL - Abnormal; Notable for the following:    Clue Cells Wet Prep HPF POC PRESENT (*)    WBC, Wet Prep HPF POC MODERATE (*)    All  other components within normal limits  LIPASE, BLOOD - Abnormal; Notable for the following:    Lipase 52 (*)    All other components within normal limits  CBC - Abnormal; Notable for the following:    WBC 11.1 (*)    RBC 5.51 (*)    MCV 72.4 (*)    MCH 23.0 (*)    All other components within normal limits  COMPREHENSIVE METABOLIC PANEL  URINALYSIS, ROUTINE W REFLEX MICROSCOPIC (NOT AT [facility])  I-STAT BETA HCG BLOOD, ED ([facility], [facility], [facility] ONLY)  GC/CHLAMYDIA PROBE AMP ([place]) NOT AT [facility]    Imaging Review No results found. I have personally reviewed and evaluated these images and lab results as part of my medical decision-making.   EKG Interpretation None      MDM   Final diagnoses:  None   Patient with right lower quadrant pain, white blood cell count slightly elevated, presentation concerning for appendicitis. Pelvic exam revealed no copious discharge, no cervical motion tenderness  less concerning for PID. Clue cells were present on wet prep. Order CT scan to rule out appendicitis.   EKG ordered with pt complaints of chest tightness. EKG showed NSR. Chest pain likely 2/2 anxiety and not cardiac in nature. Pt's BP was 118/75 upon arrival to the ED and 155/133 almost 2 hours later. BP was 142/72 at 0129. High BP likely due to cuff error.   Will treat the pts BV with Flagyl. If CT scan is negative will d/c pt with strict return precautions and f/u with PCP and GI.   Will transfer care at change of shift to [facility], PA-C who will assume patient care.          Jerre Simon, PA 01/23/16 0148  Raeford Razor, MD 01/26/16 1256

## 2016-01-23 NOTE — ED Provider Notes (Signed)
Care assumed from previous provider PA Focht, case discussed, plan agreed upon. Will follow up on abdominal CT results. If negative, treat BV and give return/follow-up instructions.  Gen: afebrile, VSS, NAD HEENT: EOMI, MMM Resp: no resp distress CV: RRR, no MRG Abd: soft, ND - TTP of RLQ/suprapubic region MsK: moving all extremities well Neuro: A&O x4  All labs and imaging reviewed. CT abdomen unremarkable. Will discharge home with Flagyl and Zofran for nausea. Home care instructions and follow-up care discussed. Return precautions discussed and all questions answered.  [facility] Center[facility]ime Pilcher Kartel Wolbert, PA-C 01/23/16 [id number]  Ashlee BilisKevin Campos, MD 01/23/16 [phone]

## 2016-03-16 ENCOUNTER — Encounter (HOSPITAL_COMMUNITY): Payer: Self-pay | Admitting: Nurse Practitioner

## 2016-03-16 ENCOUNTER — Ambulatory Visit (HOSPITAL_COMMUNITY)
Admission: EM | Admit: 2016-03-16 | Discharge: 2016-03-16 | Disposition: A | Discharge status: Home / Self Care | Payer: Medicaid Other | Attending: Emergency Medicine | Admitting: Emergency Medicine

## 2016-03-16 DIAGNOSIS — S338XXS Sprain of other parts of lumbar spine and pelvis, sequela: Secondary | ICD-10-CM | POA: Diagnosis not present

## 2016-03-16 DIAGNOSIS — R0789 Other chest pain: Secondary | ICD-10-CM | POA: Diagnosis not present

## 2016-03-16 DIAGNOSIS — S39012S Strain of muscle, fascia and tendon of lower back, sequela: Secondary | ICD-10-CM

## 2016-03-16 DIAGNOSIS — T148 Other injury of unspecified body region: Secondary | ICD-10-CM

## 2016-03-16 DIAGNOSIS — R29898 Other symptoms and signs involving the musculoskeletal system: Secondary | ICD-10-CM

## 2016-03-16 DIAGNOSIS — T148XXA Other injury of unspecified body region, initial encounter: Secondary | ICD-10-CM

## 2016-03-16 DIAGNOSIS — M5489 Other dorsalgia: Secondary | ICD-10-CM

## 2016-03-16 DIAGNOSIS — R5381 Other malaise: Secondary | ICD-10-CM

## 2016-03-16 MED ORDER — TRAMADOL-ACETAMINOPHEN 37.5-325 MG PO TABS: 1.0000 | ORAL_TABLET | Freq: Four times a day (QID) | ORAL | Status: DC | PRN

## 2016-03-16 MED ORDER — NAPROXEN 375 MG PO TABS: 375.0000 mg | ORAL_TABLET | Freq: Two times a day (BID) | ORAL | Status: DC

## 2016-03-16 NOTE — ED Notes (Signed)
Pt c/o 10 month history of intermittent back and chest pain. She reports she was seen here for this in the past and given prescriptions for pain but pain persists. She has tried head, massage and otc pain meds as well. This week she has had fevers and cough. She is alert and breathing easily

## 2016-03-16 NOTE — ED Provider Notes (Signed)
CSN: [id number]     Arrival date & time 03/16/16  1732 History   First MD Initiated Contact with Patient 03/16/16 1827     Chief Complaint  Patient presents with  . Pain   (Consider location/radiation/quality/duration/timing/severity/associated sxs/prior Treatment) HPI Comments: 20 year old morbidly obese female is complaining of generalized back pain worse across the lower back and across the top of the back and shoulders. She has had back pain off and on for the past 10 months after she fell. Denies any known injury to the spine. Sometimes the pain was so bad she becomes paralyzed. She states she feels that way now even though she is sitting on the bed with one leg under the other and moving all her extremities, head and neck and showing no signs of inability to move or to be active. She points to the lower back as the area of most of the pain. She also points to the left upper anterior chest as the source of her chest pain. It is described as sharp and intermittent. Sometimes worse with movement. She works as a Child psychotherapistwaitress and this tends to make her back muscles worse. She states her pulse gets her periods of respiratory she can sit down. She denies back stretching or exercises to help strengthen and decreased pain.   Past Medical History  Diagnosis Date  . Anxiety   . Diabetes mellitus without complication (HCC)    History reviewed. No pertinent past surgical history. Family History  Problem Relation Age of Onset  . CAD Other   . Cancer Other   . Hyperlipidemia Mother   . Mental illness Mother   . Hyperlipidemia Father   . Mental illness Brother   . Heart disease Paternal Grandfather   . Hyperlipidemia Paternal Grandfather   . Hypertension Paternal Grandfather    Social History  Substance Use Topics  . Smoking status: Current Some Day Smoker    Types: Cigarettes, Cigars  . Smokeless tobacco: None  . Alcohol Use: Yes     Comment: social   OB History    No data available      Review of Systems  Constitutional: Positive for activity change. Negative for fever, chills and fatigue.  HENT: Negative.   Eyes: Negative.   Respiratory: Negative.   Cardiovascular: Positive for chest pain. Negative for leg swelling.  Gastrointestinal: Negative.   Musculoskeletal: Positive for myalgias, back pain and neck pain. Negative for joint swelling.       As per HPI  Skin: Negative for color change, pallor and rash.  Neurological: Negative.   All other systems reviewed and are negative.   Allergies  Tramadol  Home Medications   Prior to Admission medications   Medication Sig Start Date End Date Taking? Authorizing Provider  FLUoxetine (PROZAC) 10 MG capsule Take 10 mg by mouth daily.    Historical Provider, MD  naproxen (NAPROSYN) 375 MG tablet Take 1 tablet (375 mg total) by mouth 2 (two) times daily. 03/16/16   Hayden Rasmussenavid Demorris Choyce, NP  ondansetron (ZOFRAN ODT) 4 MG disintegrating tablet Take 1 tablet (4 mg total) by mouth every 8 (eight) hours as needed for nausea or vomiting. 01/23/16   Chase PicketJaime Pilcher Ward, PA-C  traMADol-acetaminophen (ULTRACET) 37.5-325 MG tablet Take 1 tablet by mouth every 6 (six) hours as needed. 03/16/16   Hayden Rasmussenavid Keilen Kahl, NP   Meds Ordered and Administered this Visit  Medications - No data to display  BP 117/85 mmHg  Pulse 95  Temp(Src) 98.3 F (36.8 C) (Oral)  Resp 18  SpO2 100%  LMP 03/02/2016 No data found.   Physical Exam  Constitutional: She is oriented to person, place, and time. She appears well-developed and well-nourished. No distress.  HENT:  Head: Normocephalic and atraumatic.  Eyes: EOM are normal. Pupils are equal, round, and reactive to light.  Neck: Normal range of motion. Neck supple.  Tenderness to the paracervical musculature. No spinal tenderness, deformity or swelling or discoloration. Demonstrates full range of motion of the neck.  Cardiovascular: Normal rate, regular rhythm, normal heart sounds and intact distal pulses.    Pulmonary/Chest: Effort normal and breath sounds normal. No respiratory distress. She exhibits tenderness.  Reproducible chest wall tenderness to the left upper anterior chest and upper left parasternal border.  Musculoskeletal: She exhibits no edema.  Able to sit on the exam table and flex spine forward. She states she feels pulling and pain to the bilateral musculature. There is TTP along the parathoracic and lumbar musculature bilaterally. Mild tenderness along the length of the spine. No swelling, deformity, step-off deformity, discoloration. Moves all extremities. Strength is 5 over 5 in all 4 extremities. Does not demonstrate weakness or any sign of paralysis.  Neurological: She is alert and oriented to person, place, and time. No cranial nerve deficit.  Skin: Skin is warm and dry.  Nursing note and vitals reviewed.   ED Course  Procedures (including critical care time)  Labs Review Labs Reviewed - No data to display  Imaging Review No results found.   Visual Acuity Review  Right Eye Distance:   Left Eye Distance:   Bilateral Distance:    Right Eye Near:   Left Eye Near:    Bilateral Near:         MDM   1. Other back pain   2. Muscle strain   3. Lumbosacral strain, sequela   4. Chest wall pain   5. Physical deconditioning   6. Muscular deconditioning    Patient denies being allergic to any kind of pain medication. Patient is obese and in poor physical condition. She has chronic back pain due to the former and in proper body mechanics, poor posture and lack of activity. Heat, stretches and when pain is better start exercising. It follow-up with her primary care doctor sent is possible. He may need physical therapy or sports medicine therapy. Meds ordered this encounter  Medications  . naproxen (NAPROSYN) 375 MG tablet    Sig: Take 1 tablet (375 mg total) by mouth 2 (two) times daily.    Dispense:  20 tablet    Refill:  0    Order Specific Question:   Supervising Provider    Answer:  Charm RingsHONIG, ERIN J [id number]]  . traMADol-acetaminophen (ULTRACET) 37.5-325 MG tablet    Sig: Take 1 tablet by mouth every 6 (six) hours as needed.    Dispense:  10 tablet    Refill:  0    Order Specific Question:  Supervising Provider    Answer:  Micheline ChapmanHONIG, ERIN J [[id number]]       Hayden Rasmussenavid Coralee Edberg, NP 03/16/16 [phone]

## 2016-03-16 NOTE — Discharge Instructions (Signed)
Back Pain, Adult °Back pain is very common in adults. The cause of back pain is rarely dangerous and the pain often gets better over time. The cause of your back pain may not be known. Some common causes of back pain include: °· Strain of the muscles or ligaments supporting the spine. °· Wear and tear (degeneration) of the spinal disks. °· Arthritis. °· Direct injury to the back. °For many people, back pain may return. Since back pain is rarely dangerous, most people can learn to manage this condition on their own. °HOME CARE INSTRUCTIONS °Watch your back pain for any changes. The following actions may help to lessen any discomfort you are feeling: °· Remain active. It is stressful on your back to sit or stand in one place for long periods of time. Do not sit, drive, or stand in one place for more than 30 minutes at a time. Take short walks on even surfaces as soon as you are able. Try to increase the length of time you walk each day. °· Exercise regularly as directed by your health care provider. Exercise helps your back heal faster. It also helps avoid future injury by keeping your muscles strong and flexible. °· Do not stay in bed. Resting more than 1-2 days can delay your recovery. °· Pay attention to your body when you bend and lift. The most comfortable positions are those that put less stress on your recovering back. Always use proper lifting techniques, including: °· Bending your knees. °· Keeping the load close to your body. °· Avoiding twisting. °· Find a comfortable position to sleep. Use a firm mattress and lie on your side with your knees slightly bent. If you lie on your back, put a pillow under your knees. °· Avoid feeling anxious or stressed. Stress increases muscle tension and can worsen back pain. It is important to recognize when you are anxious or stressed and learn ways to manage it, such as with exercise. °· Take medicines only as directed by your health care provider. Over-the-counter  medicines to reduce pain and inflammation are often the most helpful. Your health care provider may prescribe muscle relaxant drugs. These medicines help dull your pain so you can more quickly return to your normal activities and healthy exercise. °· Apply ice to the injured area: °· Put ice in a plastic bag. °· Place a towel between your skin and the bag. °· Leave the ice on for 20 minutes, 2-3 times a day for the first 2-3 days. After that, ice and heat may be alternated to reduce pain and spasms. °· Maintain a healthy weight. Excess weight puts extra stress on your back and makes it difficult to maintain good posture. °SEEK MEDICAL CARE IF: °· You have pain that is not relieved with rest or medicine. °· You have increasing pain going down into the legs or buttocks. °· You have pain that does not improve in one week. °· You have night pain. °· You lose weight. °· You have a fever or chills. °SEEK IMMEDIATE MEDICAL CARE IF:  °· You develop new bowel or bladder control problems. °· You have unusual weakness or numbness in your arms or legs. °· You develop nausea or vomiting. °· You develop abdominal pain. °· You feel faint. °  °This information is not intended to replace advice given to you by your health care provider. Make sure you discuss any questions you have with your health care provider. °  °Document Released: 09/03/2005 Document Revised: 09/24/2014 Document Reviewed: 01/05/2014 °Elsevier Interactive Patient Education ©2016 Elsevier   Inc.  Chest Wall Pain Chest wall pain is pain in or around the bones and muscles of your chest. Sometimes, an injury causes this pain. Sometimes, the cause may not be known. This pain may take several weeks or longer to get better. HOME CARE Pay attention to any changes in your symptoms. Take these actions to help with your pain:  Rest as told by your doctor.  Avoid activities that cause pain. Try not to use your chest, belly (abdominal), or side muscles to lift heavy  things.  If directed, apply ice to the painful area:  Put ice in a plastic bag.  Place a towel between your skin and the bag.  Leave the ice on for 20 minutes, 2-3 times per day.  Take over-the-counter and prescription medicines only as told by your doctor.  Do not use tobacco products, including cigarettes, chewing tobacco, and e-cigarettes. If you need help quitting, ask your doctor.  Keep all follow-up visits as told by your doctor. This is important. GET HELP IF:  You have a fever.  Your chest pain gets worse.  You have new symptoms. GET HELP RIGHT AWAY IF:  You feel sick to your stomach (nauseous) or you throw up (vomit).  You feel sweaty or light-headed.  You have a cough with phlegm (sputum) or you cough up blood.  You are short of breath.   This information is not intended to replace advice given to you by your health care provider. Make sure you discuss any questions you have with your health care provider.   Document Released: 02/20/2008 Document Revised: 05/25/2015 Document Reviewed: 11/29/2014 Elsevier Interactive Patient Education 2016 [employer]  Deconditioning Deconditioning refers to the changes in your body that occur during a period of inactivity. Deconditioning results in changes to your heart, lungs, and muscles. These changes decrease your ability to endure activity, resulting in feelings of fatigue and weakness. Deconditioning can occur after only a few days of bed rest or inactivity. The longer the period of inactivity, the more severe your symptoms of deconditioning will be. After longer periods of inactivity, it will also take longer for you to return to your previous level of functioning. Deconditioning can be mild, moderate, or severe:  Mild. Your condition interferes with your ability to perform your usual types of exercise, such as running, biking, or swimming.  Moderate. Your condition interferes with your ability to do normal everyday  activities. This may include walking, grocery shopping, or doing chores or lawn work.  Severe. Your condition interferes with your ability to perform minimal activity or normal self-care. CAUSES  Some common reasons for inactivity that may result in deconditioning include:  Illnesses, such as cancer, stroke, heart attack, fibromyalgia, or chronic fatigue syndrome.  Injuries, especially back injuries, broken bones, or injury to ligaments or tendons.  Surgery or a long stay in the hospital for any reason.  Pregnancy, especially with conditions that require long periods of bed rest. RISK FACTORS Anything that results in a period of hospitalization or bed rest will put you at risk of deconditioning. Some other factors that can increase the risk include:  Obesity.  Poor nutrition.  Old age.  Injuries or illnesses that interfere with movement and activity. SIGNS AND SYMPTOMS  Feeling weak.  Feeling tired.  Shortness of breath with minor exertion.  Your heart beating faster than normal. You may or may not notice this without taking your pulse.  Pain or discomfort with activity.  Decreased strength.  Decreased sense  of balance.  Decreased endurance.  Difficulty participating in usual forms of exercise.  Difficulty doing activities of daily living, such as grocery shopping or chores.  Difficulty walking around the house and doing basic self-care, such as getting to the bathroom, preparing meals, or doing laundry. DIAGNOSIS  There is no specific test to diagnose deconditioning. Your health care provider will take your medical history and do a physical exam. During the physical exam, the health care provider will check for signs of deconditioning, such as:  Decreased size of muscles.  Decreased strength.  Difficulty with balance.  Shortness of breath or abnormally increased heart rate after minor exertion. TREATMENT  Treatment usually involves a structured exercise  program in which activity is increased gradually. Your health care provider will determine which exercises are right for you. The exercise program will likely include aerobic exercise and strength training. Aerobic exercise helps improve the functioning of the heart and lungs as well as the muscles. Strength training helps improve muscle size and strength. Both of these types of exercise will improve your endurance. You may be referred to a physical therapist who can create a safe strengthening program for you to follow. HOME CARE INSTRUCTIONS  Follow the exercise program recommended by your health care provider or physical therapist.  Do not increase your exercise any faster than directed.  Eat a healthy diet.  If your health care provider thinks that you need to lose weight, consider seeing a dietitian to help you do so in a healthy way.  Do not use any tobacco products, including cigarettes, chewing tobacco, or electronic cigarettes. If you need help quitting, ask your health care provider.  Take medicines only as directed by your health care provider.  Keep all follow-up visits as directed by your health care provider. This is important. SEEK MEDICAL CARE IF:  You are not able to carry out the prescribed exercise program.  You are not able to carry out your usual level of activity.  You are having trouble doing normal household chores or caring for yourself.  You are becoming increasingly fatigued and weak.  You become light-headed when rising to a sitting or standing position.  Your level of endurance decreases after having improved. SEEK IMMEDIATE MEDICAL CARE IF:  You have chest pain.  You are very short of breath.  You have any episodes of passing out.   This information is not intended to replace advice given to you by your health care provider. Make sure you discuss any questions you have with your health care provider.   Document Released: 01/18/2014 Document  Reviewed: 01/18/2014 Elsevier Interactive Patient Education [employer] Elsevier Inc.

## 2016-05-04 ENCOUNTER — Inpatient Hospital Stay (HOSPITAL_COMMUNITY): Payer: Medicaid Other

## 2016-05-04 ENCOUNTER — Inpatient Hospital Stay (HOSPITAL_COMMUNITY)
Admission: AD | Admit: 2016-05-04 | Discharge: 2016-05-04 | Disposition: A | Discharge status: Home / Self Care | Payer: Medicaid Other | Source: Ambulatory Visit | Attending: Obstetrics & Gynecology | Admitting: Obstetrics & Gynecology

## 2016-05-04 ENCOUNTER — Encounter (HOSPITAL_COMMUNITY): Payer: Self-pay

## 2016-05-04 DIAGNOSIS — K389 Disease of appendix, unspecified: Secondary | ICD-10-CM

## 2016-05-04 DIAGNOSIS — F17210 Nicotine dependence, cigarettes, uncomplicated: Secondary | ICD-10-CM | POA: Diagnosis not present

## 2016-05-04 DIAGNOSIS — R112 Nausea with vomiting, unspecified: Secondary | ICD-10-CM | POA: Diagnosis not present

## 2016-05-04 DIAGNOSIS — R102 Pelvic and perineal pain unspecified side: Secondary | ICD-10-CM

## 2016-05-04 DIAGNOSIS — R1031 Right lower quadrant pain: Secondary | ICD-10-CM

## 2016-05-04 DIAGNOSIS — Z8249 Family history of ischemic heart disease and other diseases of the circulatory system: Secondary | ICD-10-CM | POA: Diagnosis not present

## 2016-05-04 DIAGNOSIS — Z79899 Other long term (current) drug therapy: Secondary | ICD-10-CM | POA: Insufficient documentation

## 2016-05-04 DIAGNOSIS — K37 Unspecified appendicitis: Secondary | ICD-10-CM

## 2016-05-04 DIAGNOSIS — I252 Old myocardial infarction: Secondary | ICD-10-CM | POA: Diagnosis not present

## 2016-05-04 DIAGNOSIS — K5900 Constipation, unspecified: Secondary | ICD-10-CM | POA: Insufficient documentation

## 2016-05-04 DIAGNOSIS — Z888 Allergy status to other drugs, medicaments and biological substances status: Secondary | ICD-10-CM | POA: Diagnosis not present

## 2016-05-04 DIAGNOSIS — E119 Type 2 diabetes mellitus without complications: Secondary | ICD-10-CM | POA: Insufficient documentation

## 2016-05-04 DIAGNOSIS — R109 Unspecified abdominal pain: Secondary | ICD-10-CM | POA: Insufficient documentation

## 2016-05-04 DIAGNOSIS — N939 Abnormal uterine and vaginal bleeding, unspecified: Secondary | ICD-10-CM

## 2016-05-04 DIAGNOSIS — K5901 Slow transit constipation: Secondary | ICD-10-CM

## 2016-05-04 DIAGNOSIS — N938 Other specified abnormal uterine and vaginal bleeding: Secondary | ICD-10-CM | POA: Insufficient documentation

## 2016-05-04 DIAGNOSIS — F419 Anxiety disorder, unspecified: Secondary | ICD-10-CM | POA: Diagnosis not present

## 2016-05-04 HISTORY — DX: Acute myocardial infarction, unspecified: I21.9

## 2016-05-04 LAB — URINALYSIS, ROUTINE W REFLEX MICROSCOPIC
BILIRUBIN URINE: NEGATIVE
GLUCOSE, UA: NEGATIVE mg/dL
KETONES UR: 15 mg/dL — AB
Leukocytes, UA: NEGATIVE
Nitrite: NEGATIVE
PROTEIN: 30 mg/dL — AB
Specific Gravity, Urine: 1.025 (ref 1.005–1.030)
pH: 6 (ref 5.0–8.0)

## 2016-05-04 LAB — CBC WITH DIFFERENTIAL/PLATELET
BASOS PCT: 0 %
Basophils Absolute: 0 10*3/uL (ref 0.0–0.1)
EOS ABS: 0.1 10*3/uL (ref 0.0–0.7)
EOS PCT: 1 %
HCT: 39.3 % (ref 36.0–46.0)
HEMOGLOBIN: 12.8 g/dL (ref 12.0–15.0)
LYMPHS ABS: 2.6 10*3/uL (ref 0.7–4.0)
Lymphocytes Relative: 28 %
MCH: 23.6 pg — AB (ref 26.0–34.0)
MCHC: 32.6 g/dL (ref 30.0–36.0)
MCV: 72.5 fL — ABNORMAL LOW (ref 78.0–100.0)
MONOS PCT: 8 %
Monocytes Absolute: 0.8 10*3/uL (ref 0.1–1.0)
NEUTROS PCT: 63 %
Neutro Abs: 5.9 10*3/uL (ref 1.7–7.7)
PLATELETS: 295 10*3/uL (ref 150–400)
RBC: 5.42 MIL/uL — AB (ref 3.87–5.11)
RDW: 16.2 % — ABNORMAL HIGH (ref 11.5–15.5)
WBC: 9.4 10*3/uL (ref 4.0–10.5)

## 2016-05-04 LAB — COMPREHENSIVE METABOLIC PANEL
ALK PHOS: 121 U/L (ref 38–126)
ALT: 21 U/L (ref 14–54)
ANION GAP: 3 — AB (ref 5–15)
AST: 22 U/L (ref 15–41)
Albumin: 3.7 g/dL (ref 3.5–5.0)
BILIRUBIN TOTAL: 0.4 mg/dL (ref 0.3–1.2)
BUN: 10 mg/dL (ref 6–20)
CALCIUM: 9.2 mg/dL (ref 8.9–10.3)
CO2: 29 mmol/L (ref 22–32)
Chloride: 106 mmol/L (ref 101–111)
Creatinine, Ser: 0.69 mg/dL (ref 0.44–1.00)
GLUCOSE: 87 mg/dL (ref 65–99)
Potassium: 4.6 mmol/L (ref 3.5–5.1)
Sodium: 138 mmol/L (ref 135–145)
TOTAL PROTEIN: 7.1 g/dL (ref 6.5–8.1)

## 2016-05-04 LAB — WET PREP, GENITAL
CLUE CELLS WET PREP: NONE SEEN
Sperm: NONE SEEN
TRICH WET PREP: NONE SEEN
Yeast Wet Prep HPF POC: NONE SEEN

## 2016-05-04 LAB — URINE MICROSCOPIC-ADD ON

## 2016-05-04 LAB — POCT PREGNANCY, URINE: Preg Test, Ur: NEGATIVE

## 2016-05-04 MED ORDER — LACTATED RINGERS IV BOLUS (SEPSIS)
1000.0000 mL | Freq: Once | INTRAVENOUS | Status: AC
  Administered 2016-05-04: 1000 mL via INTRAVENOUS

## 2016-05-04 MED ORDER — ONDANSETRON HCL 4 MG/2ML IJ SOLN
4.0000 mg | Freq: Once | INTRAMUSCULAR | Status: AC
  Administered 2016-05-04: 4 mg via INTRAVENOUS
  Filled 2016-05-04: qty 2

## 2016-05-04 MED ORDER — IOPAMIDOL (ISOVUE-300) INJECTION 61%
100.0000 mL | Freq: Once | INTRAVENOUS | Status: AC | PRN
  Administered 2016-05-04: 100 mL via INTRAVENOUS

## 2016-05-04 MED ORDER — MORPHINE SULFATE (PF) 4 MG/ML IV SOLN
4.0000 mg | Freq: Once | INTRAVENOUS | Status: AC
  Administered 2016-05-04: 4 mg via INTRAVENOUS
  Filled 2016-05-04: qty 1

## 2016-05-04 MED ORDER — ONDANSETRON 4 MG PO TBDP: 4.0000 mg | ORAL_TABLET | Freq: Three times a day (TID) | ORAL | 0 refills | Status: DC | PRN

## 2016-05-04 MED ORDER — POLYETHYLENE GLYCOL 3350 17 G PO PACK: 17.0000 g | PACK | Freq: Every day | ORAL | 0 refills | Status: DC

## 2016-05-04 MED ORDER — DIATRIZOATE MEGLUMINE & SODIUM 66-10 % PO SOLN
30.0000 mL | Freq: Once | ORAL | Status: AC
  Administered 2016-05-04: 30 mL via ORAL
  Filled 2016-05-04: qty 30

## 2016-05-04 NOTE — Discharge Instructions (Signed)
Abdominal Pain, Adult °Many things can cause abdominal pain. Usually, abdominal pain is not caused by a disease and will improve without treatment. It can often be observed and treated at home. Your health care provider will do a physical exam and possibly order blood tests and X-rays to help determine the seriousness of your pain. However, in many cases, more time must pass before a clear cause of the pain can be found. Before that point, your health care provider may not know if you need more testing or further treatment. °HOME CARE INSTRUCTIONS °Monitor your abdominal pain for any changes. The following actions may help to alleviate any discomfort you are experiencing: °· Only take over-the-counter or prescription medicines as directed by your health care provider. °· Do not take laxatives unless directed to do so by your health care provider. °· Try a clear liquid diet (broth, tea, or water) as directed by your health care provider. Slowly move to a bland diet as tolerated. °SEEK MEDICAL CARE IF: °· You have unexplained abdominal pain. °· You have abdominal pain associated with nausea or diarrhea. °· You have pain when you urinate or have a bowel movement. °· You experience abdominal pain that wakes you in the night. °· You have abdominal pain that is worsened or improved by eating food. °· You have abdominal pain that is worsened with eating fatty foods. °· You have a fever. °SEEK IMMEDIATE MEDICAL CARE IF: °· Your pain does not go away within 2 hours. °· You keep throwing up (vomiting). °· Your pain is felt only in portions of the abdomen, such as the right side or the left lower portion of the abdomen. °· You pass bloody or black tarry stools. °MAKE SURE YOU: °· Understand these instructions. °· Will watch your condition. °· Will get help right away if you are not doing well or get worse. °  °This information is not intended to replace advice given to you by your health care provider. Make sure you discuss  any questions you have with your health care provider. °  °Document Released: 06/13/2005 Document Revised: 05/25/2015 Document Reviewed: 05/13/2013 °Elsevier Interactive Patient Education ©2016 Elsevier Inc. ° °Constipation, Adult °Constipation is when a person has fewer than three bowel movements a week, has difficulty having a bowel movement, or has stools that are dry, hard, or larger than normal. As people grow older, constipation is more common. A low-fiber diet, not taking in enough fluids, and taking certain medicines may make constipation worse.  °CAUSES  °· Certain medicines, such as antidepressants, pain medicine, iron supplements, antacids, and water pills.   °· Certain diseases, such as diabetes, irritable bowel syndrome (IBS), thyroid disease, or depression.   °· Not drinking enough water.   °· Not eating enough fiber-rich foods.   °· Stress or travel.   °· Lack of physical activity or exercise.   °· Ignoring the urge to have a bowel movement.   °· Using laxatives too much.   °SIGNS AND SYMPTOMS  °· Having fewer than three bowel movements a week.   °· Straining to have a bowel movement.   °· Having stools that are hard, dry, or larger than normal.   °· Feeling full or bloated.   °· Pain in the lower abdomen.   °· Not feeling relief after having a bowel movement.   °DIAGNOSIS  °Your health care provider will take a medical history and perform a physical exam. Further testing may be done for severe constipation. Some tests may include: °· A barium enema X-ray to examine your rectum, colon, and, sometimes,   your small intestine.   °· A sigmoidoscopy to examine your lower colon.   °· A colonoscopy to examine your entire colon. °TREATMENT  °Treatment will depend on the severity of your constipation and what is causing it. Some dietary treatments include drinking more fluids and eating more fiber-rich foods. Lifestyle treatments may include regular exercise. If these diet and lifestyle recommendations do not  help, your health care provider may recommend taking over-the-counter laxative medicines to help you have bowel movements. Prescription medicines may be prescribed if over-the-counter medicines do not work.  °HOME CARE INSTRUCTIONS  °· Eat foods that have a lot of fiber, such as fruits, vegetables, whole grains, and beans. °· Limit foods high in fat and processed sugars, such as french fries, hamburgers, cookies, candies, and soda.   °· A fiber supplement may be added to your diet if you cannot get enough fiber from foods.   °· Drink enough fluids to keep your urine clear or pale yellow.   °· Exercise regularly or as directed by your health care provider.   °· Go to the restroom when you have the urge to go. Do not hold it.   °· Only take over-the-counter or prescription medicines as directed by your health care provider. Do not take other medicines for constipation without talking to your health care provider first.   °SEEK IMMEDIATE MEDICAL CARE IF:  °· You have bright red blood in your stool.   °· Your constipation lasts for more than 4 days or gets worse.   °· You have abdominal or rectal pain.   °· You have thin, pencil-like stools.   °· You have unexplained weight loss. °MAKE SURE YOU:  °· Understand these instructions. °· Will watch your condition. °· Will get help right away if you are not doing well or get worse. °  °This information is not intended to replace advice given to you by your health care provider. Make sure you discuss any questions you have with your health care provider. °  °Document Released: 06/01/2004 Document Revised: 09/24/2014 Document Reviewed: 06/15/2013 °Elsevier Interactive Patient Education ©2016 Elsevier Inc. ° °

## 2016-05-04 NOTE — MAU Note (Signed)
Had some bleeding last night with passing clots, no bleeding now, having lower abdominal cramping, this morning had vomiting, has Nexplanon for 8 or 9 months.

## 2016-05-04 NOTE — MAU Provider Note (Signed)
History     CSN: [id number]  Arrival date and time: 05/04/16 0940     Chief Complaint  Patient presents with  . Abdominal Pain  . Vaginal Bleeding   HPI   Ashlee Thompson is a 20 year old female who presents to the maternity admissions unit at [facility] for onset of right lower quadrant pain this a.m. The onset was associated with the passage of 2-3 times has clots in her underwear. Since then she has continued to have some mild bloody discharge. She does reports she's had some significant nausea and vomiting. She denies any fevers or chills. She denies ever having abdominal surgery. She reports when she stands up and walks the pain is worse. She reports a 10 out of 10 at this time.  She reports she's been in a monogamous relationship for 6 years with her fianc. She denies any other risky behavior for PID. She reports she has a Nexplanon in place and has not had a period since then. It is been in place about one year.  OB History    No data available      Past Medical History:  Diagnosis Date  . Anxiety   . Diabetes mellitus without complication (HCC)   . Myocardial infarction [facility](HCC)     History reviewed. No pertinent surgical history.  Family History  Problem Relation Age of Onset  . Hyperlipidemia Mother   . Mental illness Mother   . Hyperlipidemia Father   . Mental illness Brother   . Heart disease Paternal Grandfather   . Hyperlipidemia Paternal Grandfather   . Hypertension Paternal Grandfather   . CAD Other   . Cancer Other     Social History  Substance Use Topics  . Smoking status: Current Some Day Smoker    Types: Cigarettes, Cigars  . Smokeless tobacco: Current User  . Alcohol use Yes     Comment: social    Allergies:  Allergies  Allergen Reactions  . Tramadol Itching    Prescriptions Prior to Admission  Medication Sig Dispense Refill Last Dose  . etonogestrel (NEXPLANON) 68 MG IMPL implant 1 each by Subdermal route continuous.   07/2015  .  FLUoxetine (PROZAC) 10 MG capsule Take 10 mg by mouth daily.   05/04/2016 at Unknown time  . naproxen (NAPROSYN) 375 MG tablet Take 1 tablet (375 mg total) by mouth 2 (two) times daily. (Patient not taking: Reported on 05/04/2016) 20 tablet 0 Not Taking at Unknown time  . ondansetron (ZOFRAN ODT) 4 MG disintegrating tablet Take 1 tablet (4 mg total) by mouth every 8 (eight) hours as needed for nausea or vomiting. (Patient not taking: Reported on 05/04/2016) 20 tablet 0 Not Taking at Unknown time    Review of Systems  Constitutional: Negative for chills and fever.  HENT: Negative for congestion.   Eyes: Negative for blurred vision and double vision.  Respiratory: Negative for cough, sputum production and shortness of breath.   Cardiovascular: Negative for chest pain and palpitations.  Gastrointestinal: Positive for abdominal pain and vomiting. Negative for constipation, diarrhea, heartburn and nausea.  Genitourinary: Negative for dysuria and urgency.  Musculoskeletal: Negative for myalgias and neck pain.  Skin: Negative for itching and rash.  Neurological: Negative for dizziness, tingling and headaches.  Endo/Heme/Allergies: Negative for environmental allergies. Does not bruise/bleed easily.  Psychiatric/Behavioral: Negative for depression and suicidal ideas.   Physical Exam   Blood pressure 110/60, pulse 110, temperature 98.8 F (37.1 C), temperature source Oral, resp. rate 18, height 5' 5.5" (  1.664 m), weight 299 lb 6.7 oz (135.8 kg).  Physical Exam  Constitutional: She is oriented to person, place, and time. She appears well-developed and well-nourished.  HENT:  Head: Normocephalic and atraumatic.  Eyes: Conjunctivae and EOM are normal. Pupils are equal, round, and reactive to light.  Cardiovascular: Normal rate, regular rhythm and normal heart sounds.  Exam reveals no gallop and no friction rub.   No murmur heard. Respiratory: Breath sounds normal. No respiratory distress. She has no  wheezes.  GI: Bowel sounds are normal. She exhibits no distension.  Positive tenderness in the right lower quadrant near McBurney's point. No rebound, minimal guarding.  Genitourinary:  Genitourinary Comments: Vaginal exam with old brown blood in the vaginal vault. No significant discharge. Questionably positive cervical motion tenderness especially with palpation on the right  Musculoskeletal: She exhibits no edema or tenderness.  Neurological: She is alert and oriented to person, place, and time.  Skin: Skin is warm and dry.  Psychiatric: She has a normal mood and affect. Her behavior is normal.    MAU Course  Procedures  MDM In the MAU patient initially presenting with concerns of vaginal bleeding associated with abdominal pain. She was in significant pain on my initial exam and with palpation of the abdomen. Patient reporting 10 out of 10 abdominal pain. At that time she was given 4 mg of IV morphine and 4 mg of IV Zofran. A pelvic ultrasound was ordered.  Patient was reexamined at 12:00 noon. At that time the morphine and Zofran had significantly improved her symptoms and she was getting ready to go to radiology. Urinalysis was reviewed with the patient as mostly negative and nothing of concern. CBC was also reviewed with no white count.  At 148 patient had returned from ultrasound it had increasing pain. She was given another 4 mg of IV morphine. Again her symptoms improved. Ultrasound was reviewed with patient and revealed no significant pathology.  At 2:15 patient had a pelvic exam noted above. However given patient low risk for PID and no significant cervical motion tenderness CT of the abdomen was ordered to rule out appendicitis.  At 4 PM patient was reevaluated and denied nausea but still reported some mild abdominal pain. Case was discussed with her and CT of the abdomen was noted to be normal at this time. Advised that it did show some mild constipation and this may be the cause  of her pain with abdominal spasm.  Shortly after I discussed the case with Dr. Charlotta Newtonzan restated patient does have chronic abdominal pain and as long as we have ruled out acute pathology she agreed that the patient was free to go. Assessment and Plan  #1 abdominal pain CBC CMP pelvic ultrasound and CT of the abdomen and pelvis with contrast were all within normal limits aside from some mild constipation. Pelvic exam was performed which was also unremarkable. Gonorrhea and chlamydia are pending. Okay for discharge home. #2: Constipation recommended MiraLAX when necessary. Ernestina Pennaicholas Aziah Kaiser 05/04/2016, 2:40 PM

## 2016-05-05 ENCOUNTER — Encounter (HOSPITAL_COMMUNITY): Payer: Self-pay | Admitting: Emergency Medicine

## 2016-05-05 ENCOUNTER — Emergency Department (HOSPITAL_COMMUNITY)
Admission: EM | Admit: 2016-05-05 | Discharge: 2016-05-05 | Disposition: A | Discharge status: Home / Self Care | Payer: Medicaid Other | Attending: Emergency Medicine | Admitting: Emergency Medicine

## 2016-05-05 DIAGNOSIS — I252 Old myocardial infarction: Secondary | ICD-10-CM | POA: Diagnosis not present

## 2016-05-05 DIAGNOSIS — I880 Nonspecific mesenteric lymphadenitis: Secondary | ICD-10-CM | POA: Diagnosis not present

## 2016-05-05 DIAGNOSIS — E119 Type 2 diabetes mellitus without complications: Secondary | ICD-10-CM | POA: Insufficient documentation

## 2016-05-05 DIAGNOSIS — F17210 Nicotine dependence, cigarettes, uncomplicated: Secondary | ICD-10-CM | POA: Insufficient documentation

## 2016-05-05 DIAGNOSIS — R1030 Lower abdominal pain, unspecified: Secondary | ICD-10-CM | POA: Diagnosis present

## 2016-05-05 LAB — COMPREHENSIVE METABOLIC PANEL
ALT: 38 U/L (ref 14–54)
AST: 39 U/L (ref 15–41)
Albumin: 3.3 g/dL — ABNORMAL LOW (ref 3.5–5.0)
Alkaline Phosphatase: 116 U/L (ref 38–126)
Anion gap: 7 (ref 5–15)
BUN: 6 mg/dL (ref 6–20)
CO2: 22 mmol/L (ref 22–32)
Calcium: 9 mg/dL (ref 8.9–10.3)
Chloride: 108 mmol/L (ref 101–111)
Creatinine, Ser: 0.52 mg/dL (ref 0.44–1.00)
GFR calc Af Amer: >60 mL/min (ref >60)
GFR calc non Af Amer: >60 mL/min (ref >60)
Glucose, Bld: 96 mg/dL (ref 65–99)
Potassium: 4.2 mmol/L (ref 3.5–5.1)
Sodium: 137 mmol/L (ref 135–145)
Total Bilirubin: 0.3 mg/dL (ref 0.3–1.2)
Total Protein: 6.8 g/dL (ref 6.5–8.1)

## 2016-05-05 LAB — CBC
HCT: 40.3 % (ref 36.0–46.0)
Hemoglobin: 12.5 g/dL (ref 12.0–15.0)
MCH: 23.5 pg — ABNORMAL LOW (ref 26.0–34.0)
MCHC: 31 g/dL (ref 30.0–36.0)
MCV: 75.6 fL — ABNORMAL LOW (ref 78.0–100.0)
Platelets: 315 10*3/uL (ref 150–400)
RBC: 5.33 MIL/uL — ABNORMAL HIGH (ref 3.87–5.11)
RDW: 15.5 % (ref 11.5–15.5)
WBC: 8.1 10*3/uL (ref 4.0–10.5)

## 2016-05-05 LAB — LIPASE, BLOOD: Lipase: 38 U/L (ref 11–51)

## 2016-05-05 MED ORDER — HYDROMORPHONE HCL 1 MG/ML IJ SOLN
1.0000 mg | Freq: Once | INTRAMUSCULAR | Status: DC
  Filled 2016-05-05: qty 1

## 2016-05-05 MED ORDER — PROMETHAZINE HCL 25 MG PO TABS
25.0000 mg | ORAL_TABLET | Freq: Once | ORAL | Status: AC
  Administered 2016-05-05: 25 mg via ORAL
  Filled 2016-05-05: qty 1

## 2016-05-05 MED ORDER — ONDANSETRON 4 MG PO TBDP
4.0000 mg | ORAL_TABLET | Freq: Once | ORAL | Status: AC
  Administered 2016-05-05: 4 mg via ORAL

## 2016-05-05 MED ORDER — ONDANSETRON 4 MG PO TBDP
ORAL_TABLET | ORAL | Status: AC
  Filled 2016-05-05: qty 1

## 2016-05-05 MED ORDER — OXYCODONE-ACETAMINOPHEN 5-325 MG PO TABS: 1.0000 | ORAL_TABLET | ORAL | 0 refills | Status: DC | PRN

## 2016-05-05 MED ORDER — HYDROMORPHONE HCL 1 MG/ML IJ SOLN
1.0000 mg | Freq: Once | INTRAMUSCULAR | Status: AC
  Administered 2016-05-05: 1 mg via INTRAVENOUS

## 2016-05-05 MED ORDER — LORAZEPAM 1 MG PO TABS
1.0000 mg | ORAL_TABLET | Freq: Once | ORAL | Status: AC
  Administered 2016-05-05: 1 mg via ORAL
  Filled 2016-05-05: qty 1

## 2016-05-05 MED ORDER — DICYCLOMINE HCL 10 MG PO CAPS
10.0000 mg | ORAL_CAPSULE | Freq: Once | ORAL | Status: AC
  Administered 2016-05-05: 10 mg via ORAL
  Filled 2016-05-05: qty 1

## 2016-05-05 MED ORDER — DICYCLOMINE HCL 20 MG PO TABS: 20.0000 mg | ORAL_TABLET | Freq: Four times a day (QID) | ORAL | 0 refills | Status: DC | PRN

## 2016-05-05 NOTE — ED Notes (Signed)
Doctor at bedside.

## 2016-05-05 NOTE — ED Notes (Signed)
Unable to provide urine sample at this time

## 2016-05-05 NOTE — ED Provider Notes (Signed)
[facility]-EM[facility]Y DEPT Provider Note   CSN: [id number] Arrival date & time: 05/05/16  4098  By signing my name below, I, Doreatha Martin, attest that this documentation has been prepared under the direction and in the presence of Raeford Razor, MD. Electronically Signed: Doreatha Martin, ED Scribe. 05/05/16. 10:22 AM.     History   Chief Complaint Chief Complaint  Patient presents with  . Abdominal Pain  . Nausea  . Emesis  . Vaginal Bleeding  . Blood In Stools    HPI Ashlee Thompson is a 20 y.o. female who presents to the Emergency Department complaining of moderate, constant, persistent lower abdominal pain onset yesterday morning with associated nausea and one episode of diarrhea this morning. She also complains of chills, night sweats, hematuria, bloody stools. Pt was seen at [facility] yesterday for the same complaints, had unremarkable pelvic US, pelvic exam and CT A/P. Of note, CT did show scattered mesenteric lymph nodes centered in the RLQ. She was dc with Zofran and Miralax. She reports that her pain has not improved since being seen at [facility]. Pt describes her pain as stabbing and twisting. She states her pain is worsened with eating, drinking, lying flat on her stomach or back, and with ambulation. No h/o abdominal surgery. Pt states her current pain feels different from prior pelvic pain months ago after nexplanon implant. She denies known fever, enlarged lymph nodes.    The history is provided by the patient. No language interpreter was used.    Past Medical History:  Diagnosis Date  . Anxiety   . Diabetes mellitus without complication (HCC)   . Myocardial infarction (HCC)     There are no active problems to display for this patient.   History reviewed. No pertinent surgical history.  OB History    No data available       Home Medications    Prior to Admission medications   Medication Sig Start Date End Date Taking? Authorizing Provider  etonogestrel (NEXPLANON) 68 MG IMPL  implant 1 each by Subdermal route continuous.    Historical Provider, MD  FLUoxetine (PROZAC) 10 MG capsule Take 10 mg by mouth daily.    Historical Provider, MD  naproxen (NAPROSYN) 375 MG tablet Take 1 tablet (375 mg total) by mouth 2 (two) times daily. Patient not taking: Reported on 05/04/2016 03/16/16   Hayden Rasmussen, NP  ondansetron (ZOFRAN ODT) 4 MG disintegrating tablet Take 1 tablet (4 mg total) by mouth every 8 (eight) hours as needed for nausea or vomiting. Patient not taking: Reported on 05/04/2016 01/23/16   [facility] Ward, PA-C  ondansetron (ZOFRAN ODT) 4 MG disintegrating tablet Take 1 tablet (4 mg total) by mouth every 8 (eight) hours as needed for nausea or vomiting. 05/04/16   Lorne Skeens, MD  polyethylene glycol [facility] Hal) packet Take 17 g by mouth daily. 05/04/16   Lorne Skeens, MD    Family History Family History  Problem Relation Age of Onset  . Hyperlipidemia Mother   . Mental illness Mother   . Hyperlipidemia Father   . Mental illness Brother   . Heart disease Paternal Grandfather   . Hyperlipidemia Paternal Grandfather   . Hypertension Paternal Grandfather   . CAD Other   . Cancer Other     Social History Social History  Substance Use Topics  . Smoking status: Current Some Day Smoker    Types: Cigarettes, Cigars  . Smokeless tobacco: Current User  . Alcohol use Yes  Comment: social     Allergies   Tramadol   Review of Systems Review of Systems  Constitutional: Positive for chills. Negative for fever.  Gastrointestinal: Positive for abdominal pain, blood in stool, diarrhea and nausea.  Genitourinary: Positive for hematuria.  All other systems reviewed and are negative.   Physical Exam Updated Vital Signs BP 144/98 (BP Location: Right Arm)   Pulse 91   Temp 98.1 F (36.7 C) (Oral)   Resp 20   SpO2 100%   Physical Exam  Constitutional: She appears well-developed and well-nourished. No distress.  Appears  uncomfortable.   HENT:  Head: Normocephalic and atraumatic.  Mouth/Throat: Oropharynx is clear and moist. No oropharyngeal exudate.  Eyes: Conjunctivae and EOM are normal. Pupils are equal, round, and reactive to light. Right eye exhibits no discharge. Left eye exhibits no discharge. No scleral icterus.  Neck: Normal range of motion. Neck supple. No JVD present. No thyromegaly present.  Cardiovascular: Normal rate, regular rhythm, normal heart sounds and intact distal pulses.  Exam reveals no gallop and no friction rub.   No murmur heard. Pulmonary/Chest: Effort normal and breath sounds normal. No respiratory distress. She has no wheezes. She has no rales.  Abdominal: Soft. Bowel sounds are normal. She exhibits no distension and no mass. There is tenderness. There is no rebound and no guarding.  Tenderness across her lower abdomen. No rebound or guarding. No distention.   Musculoskeletal: Normal range of motion. She exhibits no edema or tenderness.  Lymphadenopathy:    She has no cervical adenopathy.  Neurological: She is alert. Coordination normal.  Skin: Skin is warm and dry. No rash noted. No erythema.  Psychiatric: She has a normal mood and affect. Her behavior is normal.  Nursing note and vitals reviewed.    ED Treatments / Results  Labs (all labs ordered are listed, but only abnormal results are displayed) Labs Reviewed  COMPREHENSIVE METABOLIC PANEL - Abnormal; Notable for the following:       Result Value   Albumin 3.3 (*)    All other components within normal limits  CBC - Abnormal; Notable for the following:    RBC 5.33 (*)    MCV 75.6 (*)    MCH 23.5 (*)    All other components within normal limits  LIPASE, BLOOD  URINALYSIS, ROUTINE W REFLEX MICROSCOPIC (NOT AT [facility])    EKG  EKG Interpretation None       Radiology US Transvaginal Non-ob  Result Date: 05/04/2016 CLINICAL DATA:  20 year old female with 1 day of pelvic pain and cramping and abnormal uterine  bleeding with passage of clots. Nexplanon birth control. Negative urine pregnancy test today. EXAM: TRANSABDOMINAL AND TRANSVAGINAL ULTRASOUND OF PELVIS TECHNIQUE: Both transabdominal and transvaginal ultrasound examinations of the pelvis were performed. Transabdominal technique was performed for global imaging of the pelvis including uterus, ovaries, adnexal regions, and pelvic cul-de-sac. It was necessary to proceed with endovaginal exam following the transabdominal exam to visualize the endometrium and adnexa. COMPARISON:  01/23/2016 CT abdomen/pelvis. FINDINGS: Uterus Measurements: 6.1 x 3.2 x 5.0 cm. Anteverted uterus appears normal in size and configuration, with no uterine fibroids or other myometrial abnormalities. Endometrium Thickness: 5 mm. No endometrial cavity fluid or focal endometrial mass demonstrated. Right ovary Measurements: 3.1 x 1.6 x 1.6 cm. Normal appearance/no adnexal mass. Left ovary Measurements: 2.1 x 1.2 x 2.6 cm. Normal appearance/no adnexal mass. Other findings No abnormal free fluid. IMPRESSION: 1. Bilayer endometrial thickness 5 mm. No endometrial cavity fluid or focal endometrial  mass detected. If bleeding remains unresponsive to hormonal or medical therapy, sonohysterogram should be considered for focal lesion work-up. (Ref: Radiological Reasoning: Algorithmic Workup of Abnormal Vaginal Bleeding with Endovaginal Sonography and Sonohysterography. AJR 2008; 161:W96-04; 191:S68-73). 2. No uterine fibroids. 3. Normal ovaries.  No adnexal masses. Electronically Signed   By: Delbert PhenixJason A Poff M.D.   On: 05/04/2016 13:39   [place] Pelvis Complete  Result Date: 05/04/2016 CLINICAL DATA:  20 year old female with 1 day of pelvic pain and cramping and abnormal uterine bleeding with passage of clots. Nexplanon birth control. Negative urine pregnancy test today. EXAM: TRANSABDOMINAL AND TRANSVAGINAL ULTRASOUND OF PELVIS TECHNIQUE: Both transabdominal and transvaginal ultrasound examinations of the pelvis were  performed. Transabdominal technique was performed for global imaging of the pelvis including uterus, ovaries, adnexal regions, and pelvic cul-de-sac. It was necessary to proceed with endovaginal exam following the transabdominal exam to visualize the endometrium and adnexa. COMPARISON:  01/23/2016 CT abdomen/pelvis. FINDINGS: Uterus Measurements: 6.1 x 3.2 x 5.0 cm. Anteverted uterus appears normal in size and configuration, with no uterine fibroids or other myometrial abnormalities. Endometrium Thickness: 5 mm. No endometrial cavity fluid or focal endometrial mass demonstrated. Right ovary Measurements: 3.1 x 1.6 x 1.6 cm. Normal appearance/no adnexal mass. Left ovary Measurements: 2.1 x 1.2 x 2.6 cm. Normal appearance/no adnexal mass. Other findings No abnormal free fluid. IMPRESSION: 1. Bilayer endometrial thickness 5 mm. No endometrial cavity fluid or focal endometrial mass detected. If bleeding remains unresponsive to hormonal or medical therapy, sonohysterogram should be considered for focal lesion work-up. (Ref: Radiological Reasoning: Algorithmic Workup of Abnormal Vaginal Bleeding with Endovaginal Sonography and Sonohysterography. AJR 2008; 540:J81-19; 191:S68-73). 2. No uterine fibroids. 3. Normal ovaries.  No adnexal masses. Electronically Signed   By: Delbert PhenixJason A Poff M.D.   On: 05/04/2016 13:39   Ct Abdomen Pelvis W Contrast  Result Date: 05/04/2016 CLINICAL DATA:  Right lower quadrant abdominal pain radiating across the pelvis since 1:30 this morning. Nausea and vomiting. Evaluate for appendicitis. EXAM: CT ABDOMEN AND PELVIS WITH CONTRAST TECHNIQUE: Multidetector CT imaging of the abdomen and pelvis was performed using the standard protocol following bolus administration of intravenous contrast. CONTRAST:  100mL ISOVUE-300 IOPAMIDOL (ISOVUE-300) INJECTION 61% COMPARISON:  CT scan abdomen pelvis- 01/23/2016; 10/25/2015; pelvic ultrasound - 05/04/2016 FINDINGS: Lower chest: Limited visualization of lower thorax is  negative for focal airspace opacity or pleural effusion. Normal heart size. No pericardial effusion. Hepatobiliary: Normal hepatic contour. No discrete hepatic lesions. Normal appearance of the gallbladder given degree distention. No radiopaque gallstones. No intra extrahepatic bili duct dilatation. No ascites. Pancreas: Normal appearance of the pancreas Spleen: Normal appearance of the spleen. Adrenals/Urinary Tract: There is symmetric enhancement and excretion of the bilateral kidneys. No definite renal stones on this postcontrast examination. No discrete renal lesions. No urine obstruction or perinephric stranding. Normal appearance of bilateral adrenal glands. Normal appearance of the urinary bladder given underdistention. Stomach/Bowel: A minimal amount of enteric contrast has been ingested and is seen extending to the level of the mid/ distal small bowel. Moderate colonic stool burden without evidence of enteric obstruction. The bowel is normal in course and caliber without wall thickening. Normal appearance of the terminal ileum and appendix (representative coronal images 38 through 51, series 602). No pneumoperitoneum, pneumatosis or portal venous gas. Vascular/Lymphatic: Normal caliber the abdominal aorta. The major branch vessels of the abdominal aorta appear patent on this non CTA examination. Scattered mesenteric lymph nodes centered within the right lower abdominal quadrant are numerous though individually not enlarged by size criteria  with index right lower quadrant mesenteric lymph node measuring 0.6 cm in greatest short axis diameter (image 50, series 2), similar to the 01/23/2016 examination and like reactive due to patient body habitus. No bulky retroperitoneal, mesenteric, pelvic or inguinal lymphadenopathy. Reproductive: Normal appearance of the pelvic organs. No discrete adnexal lesion. No free fluid in the pelvic cul-de-sac. Other: Small mesenteric fat containing periumbilical hernia.  Musculoskeletal: No acute or aggressive osseous abnormalities. IMPRESSION: No explanation for patient's sudden onset of right lower quadrant abdominal pain. Specifically, no evidence of enteric or urinary obstruction. Normal appearance of the appendix. Electronically Signed   By: Simonne ComeJohn  Watts M.D.   On: 05/04/2016 16:27    Procedures Procedures (including critical care time)  DIAGNOSTIC STUDIES: Oxygen Saturation is 100% on RA, normal by my interpretation.    COORDINATION OF CARE: 10:17 AM Discussed treatment plan with pt at bedside which includes lab work, pain management and pt agreed to plan.    Medications Ordered in ED Medications  ondansetron (ZOFRAN-ODT) 4 MG disintegrating tablet (not administered)  ondansetron (ZOFRAN-ODT) disintegrating tablet 4 mg (4 mg Oral Given 05/05/16 [id number])     Initial Impression / Assessment and Plan / ED Course  I have reviewed the triage vital signs and the nursing notes.  Pertinent labs & imaging results that were available during my care of the patient were reviewed by me and considered in my medical decision making (see chart for details).  Clinical Course    20yF with abdominal pain. W/u from yesterday reviewed and pretty extensive. Mesenteric adenitis? Adenopathy noted on previous imaging. If has continued symptoms, they may potentially need biopsied although none appear to be pathologically enlarged. PRN pain meds. It has been determined that no acute conditions requiring further emergency intervention are present at this time. The patient has been advised of the diagnosis and plan. I reviewed any labs and imaging including any potential incidental findings. We have discussed signs and symptoms that warrant return to the ED and they are listed in the discharge instructions.    Final Clinical Impressions(s) / ED Diagnoses   Final diagnoses:  Lower abdominal pain  Nonspecific mesenteric adenitis    New Prescriptions Discharge Medication  List as of 05/05/2016 12:04 PM    START taking these medications   Details  dicyclomine (BENTYL) 20 MG tablet Take 1 tablet (20 mg total) by mouth every 6 (six) hours as needed for spasms., Starting Sat 05/05/2016, Print    oxyCODONE-acetaminophen (PERCOCET/ROXICET) 5-325 MG tablet Take 1 tablet by mouth every 4 (four) hours as needed for severe pain., Starting Sat 05/05/2016, Print        I personally preformed the services scribed in my presence. The recorded information has been reviewed is accurate. Raeford RazorStephen Evelina Lore, MD.    Raeford RazorStephen Kailah Pennel, MD 05/07/16 [phone]

## 2016-05-05 NOTE — ED Triage Notes (Signed)
Pt c/o abdominal pain, N/V, vaginal bleed and blood in stools since yesterday morning.

## 2016-05-07 LAB — GC/CHLAMYDIA PROBE AMP (~~LOC~~) NOT AT ARMC
CHLAMYDIA, DNA PROBE: NEGATIVE
NEISSERIA GONORRHEA: NEGATIVE

## 2016-05-17 ENCOUNTER — Emergency Department (HOSPITAL_COMMUNITY)
Admission: EM | Admit: 2016-05-17 | Discharge: 2016-05-17 | Disposition: A | Discharge status: Home / Self Care | Payer: Medicaid Other | Attending: Emergency Medicine | Admitting: Emergency Medicine

## 2016-05-17 ENCOUNTER — Encounter (HOSPITAL_COMMUNITY): Payer: Self-pay | Admitting: Emergency Medicine

## 2016-05-17 ENCOUNTER — Emergency Department (HOSPITAL_COMMUNITY): Payer: Medicaid Other

## 2016-05-17 DIAGNOSIS — R1084 Generalized abdominal pain: Secondary | ICD-10-CM | POA: Insufficient documentation

## 2016-05-17 DIAGNOSIS — K5900 Constipation, unspecified: Secondary | ICD-10-CM | POA: Insufficient documentation

## 2016-05-17 DIAGNOSIS — R112 Nausea with vomiting, unspecified: Secondary | ICD-10-CM | POA: Diagnosis present

## 2016-05-17 DIAGNOSIS — Z91040 Latex allergy status: Secondary | ICD-10-CM | POA: Diagnosis not present

## 2016-05-17 DIAGNOSIS — I252 Old myocardial infarction: Secondary | ICD-10-CM | POA: Diagnosis not present

## 2016-05-17 DIAGNOSIS — E119 Type 2 diabetes mellitus without complications: Secondary | ICD-10-CM | POA: Insufficient documentation

## 2016-05-17 DIAGNOSIS — F17210 Nicotine dependence, cigarettes, uncomplicated: Secondary | ICD-10-CM | POA: Diagnosis not present

## 2016-05-17 LAB — I-STAT BETA HCG BLOOD, ED (MC, WL, AP ONLY)

## 2016-05-17 LAB — CBC WITH DIFFERENTIAL/PLATELET
BASOS PCT: 0 %
Basophils Absolute: 0 10*3/uL (ref 0.0–0.1)
Eosinophils Absolute: 0.2 10*3/uL (ref 0.0–0.7)
Eosinophils Relative: 2 %
HEMATOCRIT: 41 % (ref 36.0–46.0)
HEMOGLOBIN: 12.7 g/dL (ref 12.0–15.0)
LYMPHS PCT: 37 %
Lymphs Abs: 3.6 10*3/uL (ref 0.7–4.0)
MCH: 23.3 pg — ABNORMAL LOW (ref 26.0–34.0)
MCHC: 31 g/dL (ref 30.0–36.0)
MCV: 75.4 fL — ABNORMAL LOW (ref 78.0–100.0)
MONOS PCT: 12 %
Monocytes Absolute: 1.1 10*3/uL — ABNORMAL HIGH (ref 0.1–1.0)
NEUTROS ABS: 4.7 10*3/uL (ref 1.7–7.7)
NEUTROS PCT: 49 %
Platelets: 350 10*3/uL (ref 150–400)
RBC: 5.44 MIL/uL — ABNORMAL HIGH (ref 3.87–5.11)
RDW: 16 % — ABNORMAL HIGH (ref 11.5–15.5)
WBC: 9.6 10*3/uL (ref 4.0–10.5)

## 2016-05-17 LAB — LIPASE, BLOOD: Lipase: 44 U/L (ref 11–51)

## 2016-05-17 LAB — COMPREHENSIVE METABOLIC PANEL
ALBUMIN: 3.6 g/dL (ref 3.5–5.0)
ALT: 23 U/L (ref 14–54)
AST: 22 U/L (ref 15–41)
Alkaline Phosphatase: 110 U/L (ref 38–126)
Anion gap: 7 (ref 5–15)
BUN: 9 mg/dL (ref 6–20)
CHLORIDE: 105 mmol/L (ref 101–111)
CO2: 25 mmol/L (ref 22–32)
Calcium: 9.4 mg/dL (ref 8.9–10.3)
Creatinine, Ser: 0.68 mg/dL (ref 0.44–1.00)
GFR calc Af Amer: >60 mL/min (ref >60)
GFR calc non Af Amer: >60 mL/min (ref >60)
GLUCOSE: 76 mg/dL (ref 65–99)
POTASSIUM: 4.4 mmol/L (ref 3.5–5.1)
SODIUM: 137 mmol/L (ref 135–145)
TOTAL PROTEIN: 7.4 g/dL (ref 6.5–8.1)
Total Bilirubin: 0.5 mg/dL (ref 0.3–1.2)

## 2016-05-17 MED ORDER — HYDROMORPHONE HCL 1 MG/ML IJ SOLN
1.0000 mg | Freq: Once | INTRAMUSCULAR | Status: AC
  Administered 2016-05-17: 1 mg via INTRAVENOUS
  Filled 2016-05-17: qty 1

## 2016-05-17 MED ORDER — ONDANSETRON HCL 4 MG/2ML IJ SOLN
4.0000 mg | Freq: Once | INTRAMUSCULAR | Status: DC
Start: 2016-05-17 — End: 2016-05-17

## 2016-05-17 MED ORDER — MAGNESIUM CITRATE PO SOLN: 1.0000 | Freq: Once | ORAL | 0 refills | Status: AC

## 2016-05-17 MED ORDER — ONDANSETRON 4 MG PO TBDP
4.0000 mg | ORAL_TABLET | Freq: Once | ORAL | Status: AC
  Administered 2016-05-17: 4 mg via ORAL
  Filled 2016-05-17: qty 1

## 2016-05-17 MED ORDER — POLYETHYLENE GLYCOL 3350 17 G PO PACK: 17.0000 g | PACK | Freq: Every day | ORAL | 3 refills | Status: DC

## 2016-05-17 NOTE — ED Notes (Signed)
MD zackowski aware of patient pain level. Pt given heating packs to apply to abdomen until further orders.

## 2016-05-17 NOTE — ED Triage Notes (Signed)
Pt to ER by private vehicle with complaint of lower quadrant (right and left) abdominal pain onset 1 week ago. Was seen by PCP and told had a large amount of stool in colon, was prescribed laxatives. Has not had a bowel movement in 4 days regardless. Also reports not urinating as well, has to bear down to urinate. Pt a/o x4.

## 2016-05-17 NOTE — Discharge Instructions (Signed)
Make an appointment to follow-up with GI medicine. Use the mag citrate when you get home. Continue use the Mira lax. Return for any new or worse symptoms. School note provided.

## 2016-05-17 NOTE — ED Notes (Signed)
MD Zackowski at bedside. 

## 2016-05-17 NOTE — ED Provider Notes (Signed)
[facility] DEPT Provider Note   CSN: [id number] Arrival date & time: 05/17/16  [id number]     History   Chief Complaint Chief Complaint  Patient presents with  . Abdominal Pain    HPI Ashlee Thompson is a 20 y.o. female.  Patient with a long history of constipation problems even as a child. Patient was evaluated on August 19 for abdominal pain nausea and vomiting. CT scan at that time showed evidence of constipation but no other significant findings. Patient has been taking Mira lax at home but also was discharged home with pain medicine. Pain medication could be counterproductive to trying to clear up the constipation. Patient also got confused and was taking Pepto-Bismol for the constipation. This also could take things worse since Pepto-Bismol per designed for diarrhea. Patient was driving on her way to school today when she had acute crampy abdominal pain associated with nausea and vomiting. 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 Patient's pain is still generalized abdominal pain is not localized to any particular area. Patient feels her abdomen is distended. Pain is 8 out of 10.      Past Medical History:  Diagnosis Date  . Anxiety   . Diabetes mellitus without complication (HCC)   . Myocardial infarction (HCC)     There are no active problems to display for this patient.   History reviewed. No pertinent surgical history.  OB History    No data available       Home Medications    Prior to Admission medications   Medication Sig Start Date End Date Taking? Authorizing Provider  etonogestrel (NEXPLANON) 68 MG IMPL implant 1 each by Subdermal route once. Implanted November 2016   Yes Historical Provider, MD  FLUoxetine (PROZAC) 10 MG capsule Take 10 mg by mouth daily at 12 noon.    Yes Historical Provider, MD  hydrOXYzine (ATARAX/VISTARIL) 50 MG tablet Take 100 mg by mouth every 6 (six) hours as needed for anxiety.   Yes Historical Provider, MD  Menthol, Topical  Analgesic, (ICY HOT EX) Apply 1 application topically 3 (three) times daily as needed (apply to stomach for relief of menstrual cramps).   Yes Historical Provider, MD  ondansetron (ZOFRAN ODT) 4 MG disintegrating tablet Take 1 tablet (4 mg total) by mouth every 8 (eight) hours as needed for nausea or vomiting. 01/23/16  Yes Jaime Pilcher Ward, PA-C  polyethylene glycol (MIRALAX / GLYCOLAX) packet Take 17 g by mouth daily. 05/04/16  Yes Lorne SkeensNicholas Michael Schenk, MD  dicyclomine (BENTYL) 20 MG tablet Take 1 tablet (20 mg total) by mouth every 6 (six) hours as needed for spasms. Patient not taking: Reported on 05/17/2016 05/05/16   Raeford RazorStephen Kohut, MD  magnesium citrate SOLN Take 296 mLs (1 Bottle total) by mouth once. 05/17/16 05/17/16  Vanetta MuldersScott Mazy Culton, MD  ondansetron (ZOFRAN ODT) 4 MG disintegrating tablet Take 1 tablet (4 mg total) by mouth every 8 (eight) hours as needed for nausea or vomiting. Patient not taking: Reported on 05/17/2016 05/04/16   Lorne SkeensNicholas Michael Schenk, MD  oxyCODONE-acetaminophen (PERCOCET/ROXICET) 5-325 MG tablet Take 1 tablet by mouth every 4 (four) hours as needed for severe pain. Patient not taking: Reported on 05/17/2016 05/05/16   Raeford RazorStephen Kohut, MD  polyethylene glycol [facility](MIRALAX) packet Take 17 g by mouth daily. 05/17/16   Vanetta MuldersScott Vaughn Frieze, MD    Family History Family History  Problem Relation Age of Onset  . Hyperlipidemia Mother   . Mental illness Mother   . Hyperlipidemia Father   .  Mental illness Brother   . Heart disease Paternal Grandfather   . Hyperlipidemia Paternal Grandfather   . Hypertension Paternal Grandfather   . CAD Other   . Cancer Other     Social History Social History  Substance Use Topics  . Smoking status: Current Some Day Smoker    Types: Cigarettes, Cigars  . Smokeless tobacco: Current User  . Alcohol use Yes     Comment: social     Allergies   Tramadol and Latex   Review of Systems Review of Systems  Constitutional: Negative for fever.    HENT: Negative for congestion.   Eyes: Negative for visual disturbance.  Respiratory: Negative for shortness of breath.   Cardiovascular: Negative for chest pain.  Gastrointestinal: Positive for abdominal pain, nausea and vomiting.  Genitourinary: Negative for dysuria.  Musculoskeletal: Negative for back pain.  Skin: Negative for rash.  Neurological: Negative for headaches.  Hematological: Does not bruise/bleed easily.  Psychiatric/Behavioral: Negative for confusion.     Physical Exam Updated Vital Signs BP (!) 142/103 (BP Location: Right Arm)   Pulse 96   Temp 97.7 F (36.5 C) (Oral)   Resp 17   Ht 5' 5.5" (1.664 m)   Wt 129.7 kg   SpO2 93%   BMI 46.87 kg/m   Physical Exam  Constitutional: She is oriented to person, place, and time. She appears well-developed and well-nourished. No distress.  HENT:  Head: Normocephalic and atraumatic.  Eyes: EOM are normal. Pupils are equal, round, and reactive to light.  Neck: Normal range of motion. Neck supple.  Cardiovascular: Normal rate and regular rhythm.   No murmur heard. Pulmonary/Chest: Effort normal and breath sounds normal.  Abdominal: Soft. Bowel sounds are normal. She exhibits distension. There is no tenderness.  Musculoskeletal: Normal range of motion.  Neurological: She is alert and oriented to person, place, and time. No cranial nerve deficit. She exhibits normal muscle tone. Coordination normal.  Skin: Skin is warm.  Nursing note and vitals reviewed.    ED Treatments / Results  Labs (all labs ordered are listed, but only abnormal results are displayed) Labs Reviewed  CBC WITH DIFFERENTIAL/PLATELET - Abnormal; Notable for the following:       Result Value   RBC 5.44 (*)    MCV 75.4 (*)    MCH 23.3 (*)    RDW 16.0 (*)    Monocytes Absolute 1.1 (*)    All other components within normal limits  COMPREHENSIVE METABOLIC PANEL  LIPASE, BLOOD  I-STAT BETA HCG BLOOD, ED ([facility], [facility], [facility] ONLY)    EKG  EKG  Interpretation None       Radiology Dg Abd Acute W/chest  Result Date: 05/17/2016 CLINICAL DATA:  20 year old female with abdominal pain, nausea and vomiting for 10 days and constipation for 3 days. EXAM: DG ABDOMEN ACUTE W/ 1V CHEST COMPARISON:  12/15/2014 chest radiograph and 05/04/2016 abdominal CT FINDINGS: Upper limits normal heart size noted. No focal airspace disease, pleural effusion, pneumothorax or edema noted. The bowel gas pattern is unremarkable. No dilated bowel loops or pneumoperitoneum identified. No suspicious calcifications noted. No acute or suspicious bony abnormalities identified. IMPRESSION: Upper limits normal heart size, otherwise unremarkable exam. Electronically Signed   By: Harmon Pier M.D.   On: 05/17/2016 12:18    Procedures Procedures (including critical care time)  Medications Ordered in ED Medications  ondansetron (ZOFRAN-ODT) disintegrating tablet 4 mg (4 mg Oral Given 05/17/16 0955)  HYDROmorphone (DILAUDID) injection 1 mg (1 mg Intravenous Given 05/17/16  1221)     Initial Impression / Assessment and Plan / ED Course  I have reviewed the triage vital signs and the nursing notes.  Pertinent labs & imaging results that were available during my care of the patient were reviewed by me and considered in my medical decision making (see chart for details).  Clinical Course    Patient report current abdominal pain nausea and vomiting. Patient evaluated on August 19 with CT scan at that time which was negative except for constipation. Patient's been taking Margot Ables asked also taking pain medicine. Patient with persistent constipation problems. Labs here today without any acute findings. Patient's plain films without any evidence of bowel obstruction. CT scan not repeated since she just had one on August 19. Patient will be treated with of mag citrate at home and will continue Mira lax. Patient also given referral to GI medicine. Nonacute abdomen.  Final Clinical  Impressions(s) / ED Diagnoses   Final diagnoses:  Constipation, unspecified constipation type  Generalized abdominal pain    New Prescriptions New Prescriptions   MAGNESIUM CITRATE SOLN    Take 296 mLs (1 Bottle total) by mouth once.   POLYETHYLENE GLYCOL (MIRALAX) PACKET    Take 17 g by mouth daily.     Vanetta Mulders, MD 05/17/16 [id number]

## 2016-06-24 ENCOUNTER — Emergency Department (HOSPITAL_COMMUNITY)
Admission: EM | Admit: 2016-06-24 | Discharge: 2016-06-25 | Disposition: A | Discharge status: Home / Self Care | Payer: Medicaid Other | Attending: Emergency Medicine | Admitting: Emergency Medicine

## 2016-06-24 ENCOUNTER — Encounter (HOSPITAL_COMMUNITY): Payer: Self-pay | Admitting: Emergency Medicine

## 2016-06-24 DIAGNOSIS — E119 Type 2 diabetes mellitus without complications: Secondary | ICD-10-CM | POA: Insufficient documentation

## 2016-06-24 DIAGNOSIS — Y999 Unspecified external cause status: Secondary | ICD-10-CM | POA: Insufficient documentation

## 2016-06-24 DIAGNOSIS — S46012A Strain of muscle(s) and tendon(s) of the rotator cuff of left shoulder, initial encounter: Secondary | ICD-10-CM | POA: Insufficient documentation

## 2016-06-24 DIAGNOSIS — Z91040 Latex allergy status: Secondary | ICD-10-CM | POA: Insufficient documentation

## 2016-06-24 DIAGNOSIS — F17210 Nicotine dependence, cigarettes, uncomplicated: Secondary | ICD-10-CM | POA: Insufficient documentation

## 2016-06-24 DIAGNOSIS — Y929 Unspecified place or not applicable: Secondary | ICD-10-CM | POA: Insufficient documentation

## 2016-06-24 DIAGNOSIS — I252 Old myocardial infarction: Secondary | ICD-10-CM | POA: Insufficient documentation

## 2016-06-24 DIAGNOSIS — Y939 Activity, unspecified: Secondary | ICD-10-CM | POA: Insufficient documentation

## 2016-06-24 DIAGNOSIS — W010XXA Fall on same level from slipping, tripping and stumbling without subsequent striking against object, initial encounter: Secondary | ICD-10-CM | POA: Insufficient documentation

## 2016-06-24 NOTE — ED Triage Notes (Signed)
Pt states "I tripped and fell two weeks ago, scraped my legs and hurt my left shoulder, I went to my campus doctor and he thinks I tore my rotater cuff, no the pain is spreading across my back., they did an xray of my shoulder and they said it wasn't broken. Now my whole arm is swollen".

## 2016-06-25 MED ORDER — OXYCODONE-ACETAMINOPHEN 5-325 MG PO TABS
2.0000 | ORAL_TABLET | Freq: Once | ORAL | Status: AC
  Administered 2016-06-25: 2 via ORAL
  Filled 2016-06-25: qty 2

## 2016-06-25 MED ORDER — CYCLOBENZAPRINE HCL 10 MG PO TABS: 10.0000 mg | ORAL_TABLET | Freq: Two times a day (BID) | ORAL | 0 refills | Status: DC | PRN

## 2016-06-25 NOTE — Discharge Instructions (Signed)
Continue stretching your shoulder 3-4 times per day. Ice your shoulder 3-4 times per day for 15-20 minutes each time as well. Continue taking meloxicam daily. You may start taking Flexeril as prescribed for muscle spasms. Keep your follow-up with your doctor this week. You may see an orthopedic specialist for persistent symptoms.

## 2016-06-25 NOTE — ED Provider Notes (Signed)
[facility]-EMERGENCY DEPT Provider Note   CSN: [id number] Arrival date & time: 06/24/16  2122    History   Chief Complaint Chief Complaint  Patient presents with  . Arm Pain  . Arm Swelling    HPI Ashlee Thompson is a 20 y.o. female.  20 year old female presents to the emergency department for evaluation of pain to her left upper extremity. She reports falling backward 2 weeks ago causing impact to her left upper extremity, injuring her left shoulder. Patient states that she was prescribed Mobic by her campus physician which she has neglected to take because she "doesn't like to take pills". She states that she has engaged in stretching exercises, but these do not seem to be helping her. She states that the pain is worse with movement of her left shoulder. She feels the pain traveling across her back. Patient also reports subjective swelling to her left upper extremity and forearm. She has had no extremity numbness or tingling or repeat injury. She states that she had x-rays performed at initial time of injury which were negative for fracture.      Past Medical History:  Diagnosis Date  . Anxiety   . Diabetes mellitus without complication (HCC)   . Myocardial infarction     There are no active problems to display for this patient.   History reviewed. No pertinent surgical history.  OB History    No data available       Home Medications    Prior to Admission medications   Medication Sig Start Date End Date Taking? Authorizing Provider  cyclobenzaprine (FLEXERIL) 10 MG tablet Take 1 tablet (10 mg total) by mouth 2 (two) times daily as needed for muscle spasms. 06/25/16   Antony Madura, PA-C  dicyclomine (BENTYL) 20 MG tablet Take 1 tablet (20 mg total) by mouth every 6 (six) hours as needed for spasms. Patient not taking: Reported on 05/17/2016 05/05/16   Raeford Razor, MD  etonogestrel (NEXPLANON) 68 MG IMPL implant 1 each by Subdermal route once. Implanted November 2016     Historical Provider, MD  FLUoxetine (PROZAC) 10 MG capsule Take 10 mg by mouth daily at 12 noon.     Historical Provider, MD  hydrOXYzine (ATARAX/VISTARIL) 50 MG tablet Take 100 mg by mouth every 6 (six) hours as needed for anxiety.    Historical Provider, MD  Menthol, Topical Analgesic, (ICY HOT EX) Apply 1 application topically 3 (three) times daily as needed (apply to stomach for relief of menstrual cramps).    Historical Provider, MD  ondansetron (ZOFRAN ODT) 4 MG disintegrating tablet Take 1 tablet (4 mg total) by mouth every 8 (eight) hours as needed for nausea or vomiting. 01/23/16   Chase Picket Ward, PA-C  ondansetron (ZOFRAN ODT) 4 MG disintegrating tablet Take 1 tablet (4 mg total) by mouth every 8 (eight) hours as needed for nausea or vomiting. Patient not taking: Reported on 05/17/2016 05/04/16   Lorne Skeens, MD  oxyCODONE-acetaminophen (PERCOCET/ROXICET) 5-325 MG tablet Take 1 tablet by mouth every 4 (four) hours as needed for severe pain. Patient not taking: Reported on 05/17/2016 05/05/16   Raeford Razor, MD  polyethylene glycol [facility] Ethelene Hal) packet Take 17 g by mouth daily. 05/04/16   Lorne Skeens, MD  polyethylene glycol [facility]) packet Take 17 g by mouth daily. 05/17/16   Vanetta Mulders, MD    Family History Family History  Problem Relation Age of Onset  . Hyperlipidemia Mother   . Mental illness Mother   .  Hyperlipidemia Father   . Mental illness Brother   . Heart disease Paternal Grandfather   . Hyperlipidemia Paternal Grandfather   . Hypertension Paternal Grandfather   . CAD Other   . Cancer Other     Social History Social History  Substance Use Topics  . Smoking status: Current Some Day Smoker    Types: Cigarettes, Cigars  . Smokeless tobacco: Current User  . Alcohol use Yes     Comment: social     Allergies   Tramadol and Latex   Review of Systems Review of Systems Ten systems reviewed and are negative for acute change,  except as noted in the HPI.    Physical Exam Updated Vital Signs BP 121/67 (BP Location: Right Arm)   Pulse 92   Temp 98.2 F (36.8 C) (Oral)   Resp 16   SpO2 100%   Physical Exam  Constitutional: She is oriented to person, place, and time. She appears well-developed and well-nourished. No distress.  HENT:  Head: Normocephalic and atraumatic.  Eyes: Conjunctivae and EOM are normal. No scleral icterus.  Neck: Normal range of motion.  Cardiovascular: Normal rate, regular rhythm and intact distal pulses.   Distal radial pulse 2+ in the left upper extremity.  Pulmonary/Chest: Effort normal. No respiratory distress.  Musculoskeletal: Normal range of motion.       Left shoulder: She exhibits tenderness and pain. She exhibits no swelling, no effusion, no crepitus, no deformity and normal pulse.  Compartments of the left upper extremity are soft. There is no pitting edema or appreciable soft tissue swelling. Range of motion of the left shoulder is preserved, though patient does experience increased discomfort with abduction past 60. No bony deformity or crepitus appreciated to the left shoulder joint.  Neurological: She is alert and oriented to person, place, and time. She exhibits normal muscle tone. Coordination normal.  Sensation to light touch intact in the left upper extremity and all digits of the left hand. Preserved grip strength of the left hand as well.  Skin: Skin is warm and dry. No rash noted. She is not diaphoretic. No erythema. No pallor.  Psychiatric: She has a normal mood and affect. Her behavior is normal.  Nursing note and vitals reviewed.    ED Treatments / Results  Labs (all labs ordered are listed, but only abnormal results are displayed) Labs Reviewed - No data to display  EKG  EKG Interpretation None       Radiology No results found.  Procedures Procedures (including critical care time)  Medications Ordered in ED Medications    oxyCODONE-acetaminophen (PERCOCET/ROXICET) 5-325 MG per tablet 2 tablet (2 tablets Oral Given 06/25/16 0021)     Initial Impression / Assessment and Plan / ED Course  I have reviewed the triage vital signs and the nursing notes.  Pertinent labs & imaging results that were available during my care of the patient were reviewed by me and considered in my medical decision making (see chart for details).  Clinical Course    20 year old female presents to the emergency department for persistent pain to her left shoulder after a fall. Symptoms consistent with strain to the left rotator cuff. Patient also concerned about swelling to her left upper extremity; however, I do not appreciate any swelling as compared to her right upper extremity. There is no pitting edema. Compartments are soft. Plan to manage supportively with NSAIDs, stretching, and muscle relaxers. Patient given referral to orthopedics for persistent symptoms. Return precautions discussed and provided. Patient  discharged in stable condition with no unaddressed concerns.   Final Clinical Impressions(s) / ED Diagnoses   Final diagnoses:  Rotator cuff strain, left, initial encounter    New Prescriptions New Prescriptions   CYCLOBENZAPRINE (FLEXERIL) 10 MG TABLET    Take 1 tablet (10 mg total) by mouth 2 (two) times daily as needed for muscle spasms.     Antony Madura, PA-C 06/25/16 0031    Azalia Bilis, MD 06/25/16 [phone]

## 2016-06-25 NOTE — ED Notes (Signed)
Pt showing NAD. RR even and unlabored. Voices no questions/concerns.

## 2016-07-07 ENCOUNTER — Emergency Department (HOSPITAL_COMMUNITY): Payer: Self-pay

## 2016-07-07 ENCOUNTER — Encounter (HOSPITAL_COMMUNITY): Payer: Self-pay

## 2016-07-07 ENCOUNTER — Inpatient Hospital Stay (HOSPITAL_COMMUNITY)
Admission: EM | Admit: 2016-07-07 | Discharge: 2016-07-09 | DRG: 378 | Disposition: A | Discharge status: Home / Self Care | Payer: Self-pay | Attending: Internal Medicine | Admitting: Internal Medicine

## 2016-07-07 ENCOUNTER — Encounter (HOSPITAL_COMMUNITY)
Admission: EM | Disposition: A | Discharge status: Home / Self Care | Payer: Self-pay | Source: Home / Self Care | Attending: Internal Medicine

## 2016-07-07 DIAGNOSIS — E119 Type 2 diabetes mellitus without complications: Secondary | ICD-10-CM | POA: Diagnosis present

## 2016-07-07 DIAGNOSIS — K922 Gastrointestinal hemorrhage, unspecified: Secondary | ICD-10-CM

## 2016-07-07 DIAGNOSIS — F17290 Nicotine dependence, other tobacco product, uncomplicated: Secondary | ICD-10-CM | POA: Diagnosis present

## 2016-07-07 DIAGNOSIS — R519 Headache, unspecified: Secondary | ICD-10-CM

## 2016-07-07 DIAGNOSIS — Z6841 Body Mass Index (BMI) 40.0 and over, adult: Secondary | ICD-10-CM

## 2016-07-07 DIAGNOSIS — F17210 Nicotine dependence, cigarettes, uncomplicated: Secondary | ICD-10-CM | POA: Diagnosis present

## 2016-07-07 DIAGNOSIS — Z8711 Personal history of peptic ulcer disease: Secondary | ICD-10-CM

## 2016-07-07 DIAGNOSIS — G43909 Migraine, unspecified, not intractable, without status migrainosus: Secondary | ICD-10-CM | POA: Diagnosis not present

## 2016-07-07 DIAGNOSIS — K920 Hematemesis: Principal | ICD-10-CM | POA: Diagnosis present

## 2016-07-07 DIAGNOSIS — Z8639 Personal history of other endocrine, nutritional and metabolic disease: Secondary | ICD-10-CM

## 2016-07-07 DIAGNOSIS — M549 Dorsalgia, unspecified: Secondary | ICD-10-CM

## 2016-07-07 DIAGNOSIS — K449 Diaphragmatic hernia without obstruction or gangrene: Secondary | ICD-10-CM | POA: Diagnosis present

## 2016-07-07 DIAGNOSIS — R51 Headache: Secondary | ICD-10-CM

## 2016-07-07 DIAGNOSIS — I252 Old myocardial infarction: Secondary | ICD-10-CM

## 2016-07-07 DIAGNOSIS — T39395A Adverse effect of other nonsteroidal anti-inflammatory drugs [NSAID], initial encounter: Secondary | ICD-10-CM | POA: Diagnosis present

## 2016-07-07 DIAGNOSIS — H538 Other visual disturbances: Secondary | ICD-10-CM

## 2016-07-07 DIAGNOSIS — Z79899 Other long term (current) drug therapy: Secondary | ICD-10-CM

## 2016-07-07 HISTORY — PX: ESOPHAGOGASTRODUODENOSCOPY: SHX5428

## 2016-07-07 LAB — COMPREHENSIVE METABOLIC PANEL
ALBUMIN: 3.4 g/dL — AB (ref 3.5–5.0)
ALK PHOS: 96 U/L (ref 38–126)
ALT: 23 U/L (ref 14–54)
ANION GAP: 7 (ref 5–15)
AST: 21 U/L (ref 15–41)
BILIRUBIN TOTAL: 0.2 mg/dL — AB (ref 0.3–1.2)
BUN: 13 mg/dL (ref 6–20)
CALCIUM: 9.1 mg/dL (ref 8.9–10.3)
CO2: 23 mmol/L (ref 22–32)
CREATININE: 0.64 mg/dL (ref 0.44–1.00)
Chloride: 105 mmol/L (ref 101–111)
GFR calc Af Amer: >60 mL/min (ref >60)
GFR calc non Af Amer: >60 mL/min (ref >60)
GLUCOSE: 93 mg/dL (ref 65–99)
Potassium: 4.1 mmol/L (ref 3.5–5.1)
SODIUM: 135 mmol/L (ref 135–145)
TOTAL PROTEIN: 6.7 g/dL (ref 6.5–8.1)

## 2016-07-07 LAB — GLUCOSE, CAPILLARY: GLUCOSE-CAPILLARY: 80 mg/dL (ref 65–99)

## 2016-07-07 LAB — CBC
HCT: 41.5 % (ref 36.0–46.0)
HCT: 36.9 % (ref 36.0–46.0)
HEMOGLOBIN: 13.4 g/dL (ref 12.0–15.0)
Hemoglobin: 11.6 g/dL — ABNORMAL LOW (ref 12.0–15.0)
MCH: 23.8 pg — AB (ref 26.0–34.0)
MCH: 23.2 pg — AB (ref 26.0–34.0)
MCHC: 32.3 g/dL (ref 30.0–36.0)
MCHC: 31.4 g/dL (ref 30.0–36.0)
MCV: 73.8 fL — ABNORMAL LOW (ref 78.0–100.0)
MCV: 73.8 fL — ABNORMAL LOW (ref 78.0–100.0)
PLATELETS: 332 10*3/uL (ref 150–400)
PLATELETS: 289 10*3/uL (ref 150–400)
RBC: 5.62 MIL/uL — ABNORMAL HIGH (ref 3.87–5.11)
RBC: 5 MIL/uL (ref 3.87–5.11)
RDW: 14.6 % (ref 11.5–15.5)
RDW: 14.7 % (ref 11.5–15.5)
WBC: 12.2 10*3/uL — ABNORMAL HIGH (ref 4.0–10.5)
WBC: 8.9 10*3/uL (ref 4.0–10.5)

## 2016-07-07 LAB — I-STAT VENOUS BLOOD GAS, ED
ACID-BASE EXCESS: 2 mmol/L (ref 0.0–2.0)
Bicarbonate: 29.6 mmol/L — ABNORMAL HIGH (ref 20.0–28.0)
O2 SAT: 48 %
PCO2 VEN: 57 mmHg (ref 44.0–60.0)
Patient temperature: 37
TCO2: 31 mmol/L (ref 0–100)
pH, Ven: 7.324 (ref 7.250–7.430)
pO2, Ven: 29 mmHg — CL (ref 32.0–45.0)

## 2016-07-07 LAB — LIPASE, BLOOD: Lipase: 36 U/L (ref 11–51)

## 2016-07-07 LAB — URINALYSIS, ROUTINE W REFLEX MICROSCOPIC
BILIRUBIN URINE: NEGATIVE
Glucose, UA: NEGATIVE mg/dL
HGB URINE DIPSTICK: NEGATIVE
KETONES UR: NEGATIVE mg/dL
Leukocytes, UA: NEGATIVE
NITRITE: NEGATIVE
PROTEIN: NEGATIVE mg/dL
SPECIFIC GRAVITY, URINE: 1.021 (ref 1.005–1.030)
pH: 7.5 (ref 5.0–8.0)

## 2016-07-07 LAB — I-STAT CG4 LACTIC ACID, ED: LACTIC ACID, VENOUS: 1.43 mmol/L (ref 0.5–1.9)

## 2016-07-07 LAB — HEMOGLOBIN AND HEMATOCRIT, BLOOD
HCT: 38.4 % (ref 36.0–46.0)
HEMATOCRIT: 38.4 % (ref 36.0–46.0)
HEMATOCRIT: 38.1 % (ref 36.0–46.0)
HEMOGLOBIN: 12.1 g/dL (ref 12.0–15.0)
HEMOGLOBIN: 12.2 g/dL (ref 12.0–15.0)
Hemoglobin: 12.1 g/dL (ref 12.0–15.0)

## 2016-07-07 LAB — PROTIME-INR
INR: 0.95
Prothrombin Time: 12.7 seconds (ref 11.4–15.2)

## 2016-07-07 LAB — TYPE AND SCREEN
ABO/RH(D): B POS
Antibody Screen: NEGATIVE

## 2016-07-07 LAB — ABO/RH: ABO/RH(D): B POS

## 2016-07-07 LAB — INFLUENZA PANEL BY PCR (TYPE A & B)
H1N1 flu by pcr: NOT DETECTED
INFLAPCR: NEGATIVE
Influenza B By PCR: NEGATIVE

## 2016-07-07 LAB — I-STAT BETA HCG BLOOD, ED (MC, WL, AP ONLY)

## 2016-07-07 SURGERY — EGD (ESOPHAGOGASTRODUODENOSCOPY)
Anesthesia: Moderate Sedation

## 2016-07-07 MED ORDER — PROMETHAZINE HCL 25 MG/ML IJ SOLN
25.0000 mg | Freq: Once | INTRAMUSCULAR | Status: AC
  Administered 2016-07-07: 25 mg via INTRAVENOUS
  Filled 2016-07-07: qty 1

## 2016-07-07 MED ORDER — FENTANYL CITRATE (PF) 100 MCG/2ML IJ SOLN
INTRAMUSCULAR | Status: AC
  Filled 2016-07-07: qty 4

## 2016-07-07 MED ORDER — GI COCKTAIL ~~LOC~~
30.0000 mL | Freq: Once | ORAL | Status: AC
  Administered 2016-07-07: 30 mL via ORAL
  Filled 2016-07-07: qty 30

## 2016-07-07 MED ORDER — PANTOPRAZOLE SODIUM 40 MG IV SOLR
40.0000 mg | Freq: Two times a day (BID) | INTRAVENOUS | Status: DC
Start: 2016-07-10 — End: 2016-07-08

## 2016-07-07 MED ORDER — DIPHENHYDRAMINE HCL 50 MG/ML IJ SOLN
INTRAMUSCULAR | Status: DC | PRN
  Administered 2016-07-07: 25 mg via INTRAVENOUS

## 2016-07-07 MED ORDER — MIDAZOLAM HCL 5 MG/ML IJ SOLN
INTRAMUSCULAR | Status: AC
  Filled 2016-07-07: qty 2

## 2016-07-07 MED ORDER — SODIUM CHLORIDE 0.9 % IV SOLN
80.0000 mg | Freq: Once | INTRAVENOUS | Status: AC
  Administered 2016-07-07: 80 mg via INTRAVENOUS
  Filled 2016-07-07: qty 80

## 2016-07-07 MED ORDER — MIDAZOLAM HCL 10 MG/2ML IJ SOLN
INTRAMUSCULAR | Status: DC | PRN
  Administered 2016-07-07 (×2): 1 mg via INTRAVENOUS

## 2016-07-07 MED ORDER — ONDANSETRON HCL 4 MG/2ML IJ SOLN
4.0000 mg | Freq: Once | INTRAMUSCULAR | Status: AC | PRN
  Administered 2016-07-07: 4 mg via INTRAVENOUS
  Filled 2016-07-07: qty 2

## 2016-07-07 MED ORDER — FENTANYL CITRATE (PF) 100 MCG/2ML IJ SOLN
INTRAMUSCULAR | Status: DC | PRN
  Administered 2016-07-07 (×3): 25 ug via INTRAVENOUS

## 2016-07-07 MED ORDER — BUTAMBEN-TETRACAINE-BENZOCAINE 2-2-14 % EX AERO
INHALATION_SPRAY | CUTANEOUS | Status: DC | PRN
  Administered 2016-07-07: 2 via TOPICAL

## 2016-07-07 MED ORDER — ONDANSETRON HCL 4 MG/2ML IJ SOLN
4.0000 mg | Freq: Four times a day (QID) | INTRAMUSCULAR | Status: DC | PRN
  Administered 2016-07-07 – 2016-07-08 (×3): 4 mg via INTRAVENOUS
  Filled 2016-07-07: qty 2

## 2016-07-07 MED ORDER — MORPHINE SULFATE (PF) 2 MG/ML IV SOLN
2.0000 mg | INTRAVENOUS | Status: DC | PRN
  Administered 2016-07-07 – 2016-07-08 (×3): 2 mg via INTRAVENOUS
  Filled 2016-07-07 (×3): qty 1

## 2016-07-07 MED ORDER — DIPHENHYDRAMINE HCL 50 MG/ML IJ SOLN
INTRAMUSCULAR | Status: AC
  Filled 2016-07-07: qty 1

## 2016-07-07 MED ORDER — FAMOTIDINE IN NACL 20-0.9 MG/50ML-% IV SOLN
20.0000 mg | Freq: Once | INTRAVENOUS | Status: AC
  Administered 2016-07-07: 20 mg via INTRAVENOUS
  Filled 2016-07-07: qty 50

## 2016-07-07 MED ORDER — SODIUM CHLORIDE 0.9% FLUSH
3.0000 mL | Freq: Two times a day (BID) | INTRAVENOUS | Status: DC
  Administered 2016-07-07 – 2016-07-09 (×3): 3 mL via INTRAVENOUS

## 2016-07-07 MED ORDER — SODIUM CHLORIDE 0.9 % IV SOLN
8.0000 mg/h | INTRAVENOUS | Status: DC
  Administered 2016-07-07 – 2016-07-08 (×3): 8 mg/h via INTRAVENOUS
  Filled 2016-07-07 (×6): qty 80

## 2016-07-07 MED ORDER — MIDAZOLAM HCL 10 MG/2ML IJ SOLN
INTRAMUSCULAR | Status: DC | PRN
  Administered 2016-07-07 (×3): 2 mg via INTRAVENOUS

## 2016-07-07 MED ORDER — FENTANYL CITRATE (PF) 100 MCG/2ML IJ SOLN
INTRAMUSCULAR | Status: DC | PRN
  Administered 2016-07-07: 25 ug via INTRAVENOUS

## 2016-07-07 MED ORDER — SODIUM CHLORIDE 0.9 % IV SOLN
INTRAVENOUS | Status: DC
  Administered 2016-07-07 (×2): via INTRAVENOUS

## 2016-07-07 MED ORDER — SODIUM CHLORIDE 0.9 % IV BOLUS (SEPSIS)
1000.0000 mL | Freq: Once | INTRAVENOUS | Status: AC
  Administered 2016-07-07: 1000 mL via INTRAVENOUS

## 2016-07-07 MED ORDER — SODIUM CHLORIDE 0.9 % IV BOLUS (SEPSIS): 1000.0000 mL | Freq: Once | INTRAVENOUS | Status: DC

## 2016-07-07 NOTE — ED Provider Notes (Signed)
[facility]-EMER[facility] DEPT Provider Note   CSN: [id number] Arrival date & time: 07/07/16  0215 By signing my name below, I, Bridgette HabermannMaria Tan, attest that this documentation has been prepared under the direction and in the presence of Shaune Pollackameron Murray Guzzetta, MD. Electronically Signed: Bridgette HabermannMaria Tan, ED Scribe. 07/07/16. 2:56 AM.  History   Chief Complaint Chief Complaint  Patient presents with  . Nausea  . Emesis   HPI Comments: Ashlee Thompson is a 20 y.o. female with h/o MI and DM who presents to the Emergency Department complaining of sudden onset, episodic vomiting just PTA with associated generalized weakness, cough, and nausea. She notes episodes of hematemesis beginning in the parking lot prior to entering the ED. She has not tried any OTC medications PTA. Pt was just recently prescribed Meloxicam for shoulder pain. Pt has h/o stomach ulcers. No known sick contacts with similar symptoms. Pt denies fever or any other associated symptoms.  No alleviating factors. Pain made worse with attempted eating.  The history is provided by the patient. No language interpreter was used.    Past Medical History:  Diagnosis Date  . Anxiety   . Diabetes mellitus without complication (HCC)   . Myocardial infarction     There are no active problems to display for this patient.   History reviewed. No pertinent surgical history.  OB History    No data available       Home Medications    Prior to Admission medications   Medication Sig Start Date End Date Taking? Authorizing Provider  etonogestrel (NEXPLANON) 68 MG IMPL implant 1 each by Subdermal route once. Implanted November 2016   Yes Historical Provider, MD  FLUoxetine (PROZAC) 10 MG capsule Take 10 mg by mouth daily at 12 noon.    Yes Historical Provider, MD  cyclobenzaprine (FLEXERIL) 10 MG tablet Take 1 tablet (10 mg total) by mouth 2 (two) times daily as needed for muscle spasms. Patient not taking: Reported on 07/07/2016 06/25/16   Antony MaduraKelly Humes, PA-C    dicyclomine (BENTYL) 20 MG tablet Take 1 tablet (20 mg total) by mouth every 6 (six) hours as needed for spasms. Patient not taking: Reported on 07/07/2016 05/05/16   Raeford RazorStephen Kohut, MD  ondansetron (ZOFRAN ODT) 4 MG disintegrating tablet Take 1 tablet (4 mg total) by mouth every 8 (eight) hours as needed for nausea or vomiting. Patient not taking: Reported on 07/07/2016 01/23/16   [facility] Healthcare PavilionJaime Pilcher Ward, PA-C  ondansetron (ZOFRAN ODT) 4 MG disintegrating tablet Take 1 tablet (4 mg total) by mouth every 8 (eight) hours as needed for nausea or vomiting. Patient not taking: Reported on 07/07/2016 05/04/16   Lorne SkeensNicholas Michael Schenk, MD  oxyCODONE-acetaminophen (PERCOCET/ROXICET) 5-325 MG tablet Take 1 tablet by mouth every 4 (four) hours as needed for severe pain. Patient not taking: Reported on 07/07/2016 05/05/16   Raeford RazorStephen Kohut, MD  polyethylene glycol [facility] / Ethelene HalGLYCOLAX) packet Take 17 g by mouth daily. Patient not taking: Reported on 07/07/2016 05/04/16   Lorne SkeensNicholas Michael Schenk, MD  polyethylene glycol [facility](MIRALAX) packet Take 17 g by mouth daily. Patient not taking: Reported on 07/07/2016 05/17/16   Vanetta MuldersScott Zackowski, MD    Family History Family History  Problem Relation Age of Onset  . Hyperlipidemia Mother   . Mental illness Mother   . Hyperlipidemia Father   . Mental illness Brother   . Heart disease Paternal Grandfather   . Hyperlipidemia Paternal Grandfather   . Hypertension Paternal Grandfather   . CAD Other   . Cancer Other  Social History Social History  Substance Use Topics  . Smoking status: Current Some Day Smoker    Types: Cigarettes, Cigars  . Smokeless tobacco: Current User  . Alcohol use Yes     Comment: social     Allergies   Tramadol and Latex   Review of Systems Review of Systems  Constitutional: Positive for fatigue. Negative for chills and fever.  HENT: Negative for congestion, rhinorrhea and sore throat.   Eyes: Negative for visual disturbance.   Respiratory: Positive for cough. Negative for shortness of breath and wheezing.   Cardiovascular: Negative for chest pain and leg swelling.  Gastrointestinal: Positive for nausea and vomiting. Negative for abdominal pain and diarrhea.  Genitourinary: Negative for dysuria, flank pain, vaginal bleeding and vaginal discharge.  Musculoskeletal: Negative for neck pain.  Skin: Negative for rash.  Allergic/Immunologic: Negative for immunocompromised state.  Neurological: Positive for weakness. Negative for syncope and headaches.  Hematological: Does not bruise/bleed easily.  All other systems reviewed and are negative.    Physical Exam Updated Vital Signs BP 101/60   Pulse 87   Temp 98 F (36.7 C) (Oral)   Resp 19   Ht 5\' 5"  (1.651 m)   Wt (!) 302 lb (137 kg)   SpO2 99%   BMI 50.26 kg/m   Physical Exam  Constitutional: She is oriented to person, place, and time. She appears well-developed and well-nourished. She appears distressed.  HENT:  Head: Normocephalic and atraumatic.  Eyes: Conjunctivae are normal.  Neck: Neck supple.  Cardiovascular: Normal rate, regular rhythm and normal heart sounds.  Exam reveals no friction rub.   No murmur heard. Pulmonary/Chest: Effort normal and breath sounds normal. No respiratory distress. She has no wheezes. She has no rales.  Abdominal: Soft. Bowel sounds are normal. She exhibits no distension. There is tenderness in the epigastric area. There is no rebound and no guarding.  Musculoskeletal: She exhibits no edema.  Neurological: She is alert and oriented to person, place, and time. She exhibits normal muscle tone.  Skin: Skin is warm. Capillary refill takes less than 2 seconds.  Psychiatric: She has a normal mood and affect.  Nursing note and vitals reviewed.    ED Treatments / Results  DIAGNOSTIC STUDIES: Oxygen Saturation is 99% on RA, normal by my interpretation.    COORDINATION OF CARE: 2:55 AM Discussed treatment plan with pt at  bedside and pt agreed to plan.  Labs (all labs ordered are listed, but only abnormal results are displayed) Labs Reviewed  COMPREHENSIVE METABOLIC PANEL - Abnormal; Notable for the following:       Result Value   Albumin 3.4 (*)    Total Bilirubin 0.2 (*)    All other components within normal limits  CBC - Abnormal; Notable for the following:    WBC 12.2 (*)    RBC 5.62 (*)    MCV 73.8 (*)    MCH 23.8 (*)    All other components within normal limits  CBC - Abnormal; Notable for the following:    Hemoglobin 11.6 (*)    MCV 73.8 (*)    MCH 23.2 (*)    All other components within normal limits  I-STAT VENOUS BLOOD GAS, ED - Abnormal; Notable for the following:    pO2, Ven 29.0 (*)    Bicarbonate 29.6 (*)    All other components within normal limits  LIPASE, BLOOD  URINALYSIS, ROUTINE W REFLEX MICROSCOPIC (NOT AT [facility])  PROTIME-INR  I-STAT BETA HCG BLOOD, ED ([facility],  [facility], [facility] ONLY)  I-STAT CG4 LACTIC ACID, ED  TYPE AND SCREEN    EKG  EKG Interpretation None       Radiology Dg Chest 2 View  Result Date: 07/07/2016 CLINICAL DATA:  20 year old female with cough. EXAM: CHEST  2 VIEW COMPARISON:  Chest radiograph dated 05/17/2016 FINDINGS: The heart size and mediastinal contours are within normal limits. Both lungs are clear. The visualized skeletal structures are unremarkable. IMPRESSION: No active cardiopulmonary disease. Electronically Signed   By: Elgie Collard M.D.   On: 07/07/2016 04:42    Procedures Procedures (including critical care time)  Medications Ordered in ED Medications  pantoprazole (PROTONIX) 80 mg in sodium chloride 0.9 % 100 mL IVPB (not administered)  ondansetron (ZOFRAN) injection 4 mg (4 mg Intravenous Given 07/07/16 0231)  promethazine (PHENERGAN) injection 25 mg (25 mg Intravenous Given 07/07/16 0307)  sodium chloride 0.9 % bolus 1,000 mL (0 mLs Intravenous Stopped 07/07/16 0517)  famotidine (PEPCID) IVPB 20 mg premix (0 mg Intravenous Stopped  07/07/16 0335)  gi cocktail (Maalox,Lidocaine,Donnatal) (30 mLs Oral Given 07/07/16 0355)     Initial Impression / Assessment and Plan / ED Course  I have reviewed the triage vital signs and the nursing notes.  Pertinent labs & imaging results that were available during my care of the patient were reviewed by me and considered in my medical decision making (see chart for details).  Clinical Course   20 year old female with PMHx of gastric ulcers here with initially brown and now blood-tinged emesis. Taking mobic over the week. On arrival here, VSS and WNL. Small volume (<1/2 cup) red emesis noted also with small blood clots. Primary concern is acute PUD/gastric ulcer with bleed, also gastritis, MW tear 2/2 gastoenteritis, viral GI illness. Will check labs, re-assess.  Labs reviewed. Initial Hgb 13.4 down to 11.6, though this is after 1L NS. CMP normal with normal BUN. Given decreasing Hgb and blood-tinged emesis, discussed with [facility]. Will place on PPI, admit to Hospitalist for obs and likely scope today. Pt HDS. Type and screen sent. No h/o varices.  Final Clinical Impressions(s) / ED Diagnoses   Final diagnoses:  UGI bleed   I personally performed the services described in this documentation, which was scribed in my presence. The recorded information has been reviewed and is accurate.    Shaune Pollack, MD 07/07/16 [phone]

## 2016-07-07 NOTE — H&P (Signed)
[facility] History and Physical  Jennier Bo ZOX:[id number] DOB: 1995-11-22 DOA: 07/07/2016  Referring physician: ED MD PCP: No PCP Per Patient   Chief Complaint: n/v  HPI: Ashlee Thompson is a 20 y.o. female history of diabetes as well as history of myocardial infarction in the past (could not confirm with notes), recently treated for left shoulder strain with Mobic on 06/24/2016, presents to the ED with headache for the past 5 hours as well as nausea, body aches and feeling "off."  She states she was taking the Mobic twice a day for about a week and ran out about 3-4 days ago. She denies taking any other over-the-counter NSAIDs or anti-inflammatory medications since.  Patient prior to arrival noted some episodic vomiting and then hematemesis in the ER parking lot.   Per patient, she had a history of stomach ulcers as a child. She states that her friend was recently discharged from the hospital with diagnosis of pneumonia and possibly flu. She says she had daily contact with a friend recently as well. She chills. Denies diarrhea, constipation, dysuria, hematuria, hematochezia denies melena.  Headache is mild now. Otherwise, denies any hematemesis, currently just tired.  Mild abd tenderness persist.  She drinks alcohol occasionally, does smoke one blackhash cigar every other day or so, trying to quit.     Review of Systems:  Per HPI, o/w all systems reviewed and negative.  Past Medical History:  Diagnosis Date  . Anxiety   . Diabetes mellitus without complication (HCC)   . Myocardial infarction    History reviewed. No pertinent surgical history. Social History:  reports that she has been smoking Cigarettes and Cigars.  She uses smokeless tobacco. She reports that she drinks alcohol. She reports that she does not use drugs.  Allergies  Allergen Reactions  . Tramadol Itching  . Latex Itching, Swelling and Rash    Reaction to condoms    Family History  Problem Relation Age  of Onset  . Hyperlipidemia Mother   . Mental illness Mother   . Hyperlipidemia Father   . Mental illness Brother   . Heart disease Paternal Grandfather   . Hyperlipidemia Paternal Grandfather   . Hypertension Paternal Grandfather   . CAD Other   . Cancer Other      Prior to Admission medications   Medication Sig Start Date End Date Taking? Authorizing Provider  etonogestrel (NEXPLANON) 68 MG IMPL implant 1 each by Subdermal route once. Implanted November 2016   Yes Historical Provider, MD  FLUoxetine (PROZAC) 10 MG capsule Take 10 mg by mouth daily at 12 noon.    Yes Historical Provider, MD  cyclobenzaprine (FLEXERIL) 10 MG tablet Take 1 tablet (10 mg total) by mouth 2 (two) times daily as needed for muscle spasms. Patient not taking: Reported on 07/07/2016 06/25/16   Antony MaduraKelly Humes, PA-C  dicyclomine (BENTYL) 20 MG tablet Take 1 tablet (20 mg total) by mouth every 6 (six) hours as needed for spasms. Patient not taking: Reported on 07/07/2016 05/05/16   Raeford RazorStephen Kohut, MD  ondansetron (ZOFRAN ODT) 4 MG disintegrating tablet Take 1 tablet (4 mg total) by mouth every 8 (eight) hours as needed for nausea or vomiting. Patient not taking: Reported on 07/07/2016 01/23/16   [facility], PA-C  ondansetron (ZOFRAN ODT) 4 MG disintegrating tablet Take 1 tablet (4 mg total) by mouth every 8 (eight) hours as needed for nausea or vomiting. Patient not taking: Reported on 07/07/2016 05/04/16   Lorne SkeensNicholas Michael Schenk, MD  oxyCODONE-acetaminophen (  PERCOCET/ROXICET) 5-325 MG tablet Take 1 tablet by mouth every 4 (four) hours as needed for severe pain. Patient not taking: Reported on 07/07/2016 05/05/16   Raeford Razor, MD  polyethylene glycol [facility]) packet Take 17 g by mouth daily. Patient not taking: Reported on 07/07/2016 05/04/16   Lorne Skeens, MD  polyethylene glycol [facility]) packet Take 17 g by mouth daily. Patient not taking: Reported on 07/07/2016 05/17/16   Vanetta Mulders,  MD   Physical Exam: Vitals:   07/07/16 0500 07/07/16 0638 07/07/16 0645 07/07/16 0700  BP: 94/59 (!) 86/62 95/59 101/60  Pulse: 86  91 87  Resp:      Temp:      TempSrc:      SpO2: 98% 98% 100% 99%  Weight:      Height:        Wt Readings from Last 3 Encounters:  07/07/16 (!) 137 kg (302 lb)  05/17/16 129.7 kg (286 lb)  05/04/16 135.8 kg (299 lb 6.7 oz)    General:  Appears calm and comfortable, pleasant, NAD, AAOx3, morbid obese. Eyes: PERRL, normal lids, irises & conjunctiva ENT: grossly normal hearing, lips & tongue, mmm Neck: no LAD, masses or thyromegaly Cardiovascular: RRR, no m/r/g. No LE edema. Telemetry: SR, no arrhythmias  Respiratory: CTA bilaterally, no w/r/r. Normal respiratory effort. Abdomen: soft, ntnd, obese Skin: no rash or induration seen on limited exam Musculoskeletal: grossly normal tone BUE/BLE Psychiatric: grossly normal mood and affect, speech fluent and appropriate Neurologic: grossly non-focal.          Labs on Admission:  Basic Metabolic Panel:  Recent Labs Lab 07/07/16 0227  NA 135  K 4.1  CL 105  CO2 23  GLUCOSE 93  BUN 13  CREATININE 0.64  CALCIUM 9.1   Liver Function Tests:  Recent Labs Lab 07/07/16 0227  AST 21  ALT 23  ALKPHOS 96  BILITOT 0.2*  PROT 6.7  ALBUMIN 3.4*    Recent Labs Lab 07/07/16 0227  LIPASE 36   No results for input(s): AMMONIA in the last 168 hours. CBC:  Recent Labs Lab 07/07/16 0227 07/07/16 0520  WBC 12.2* 8.9  HGB 13.4 11.6*  HCT 41.5 36.9  MCV 73.8* 73.8*  PLT 332 289   Cardiac Enzymes: No results for input(s): CKTOTAL, CKMB, CKMBINDEX, TROPONINI in the last 168 hours.  BNP (last 3 results) No results for input(s): BNP in the last 8760 hours.  ProBNP (last 3 results) No results for input(s): PROBNP in the last 8760 hours.  CBG: No results for input(s): GLUCAP in the last 168 hours.  Radiological Exams on Admission: Dg Chest 2 View  Result Date: 07/07/2016 CLINICAL  DATA:  20 year old female with cough. EXAM: CHEST  2 VIEW COMPARISON:  Chest radiograph dated 05/17/2016 FINDINGS: The heart size and mediastinal contours are within normal limits. Both lungs are clear. The visualized skeletal structures are unremarkable. IMPRESSION: No active cardiopulmonary disease. Electronically Signed   By: Elgie Collard M.D.   On: 07/07/2016 04:42    EKG: none  Assessment/Plan Principal Problem:   GI bleed Active Problems:   History of diabetes mellitus   Morbid obesity (HCC)   Hematemesis/vomiting blood   1. Acute GI bleed with hematemesis with history of recent Mobitz use - obs tele - gi Adolph Pollack was called by Ed, to do EGD today, appreciate assistance - protonix bolus, than gtt - npo until ok by gi - serial hh - gentle iv hydration - am labs  2. History of diabetes mellitus, in setting of morbid obesity - chk a1c 3. History of myocardial infarction, although cannot confirm a notes  4.   Headache/body aches. Per pt, had sick contact with a friend who was recently discharged from hospital with pneumonia and possible flu - r/o flu w/ pcr   Cs// GI Adolph Pollack grp called by ED.  Code Status: full DVT Prophylaxis: scds Family Communication: none Disposition Plan: obs tele  Time spent:  Pete Glatter MD., MBA/MHA [facility] Pager [phone]

## 2016-07-07 NOTE — Progress Notes (Signed)
[facility]

## 2016-07-07 NOTE — Interval H&P Note (Signed)
History and Physical Interval Note:  07/07/2016 12:26 PM  Ashlee Thompson  has presented today for surgery, with the diagnosis of hematemesis  The various methods of treatment have been discussed with the patient and family. After consideration of risks, benefits and other options for treatment, the patient has consented to  Procedure(s): ESOPHAGOGASTRODUODENOSCOPY (EGD) (N/A) as a surgical intervention .  The patient's history has been reviewed, patient examined, no change in status, stable for surgery.  I have reviewed the patient's chart and labs.  Questions were answered to the patient's satisfaction.     Reeves ForthSteven Paul Cadi Rhinehart

## 2016-07-07 NOTE — Consult Note (Signed)
Referring Provider:  [facility]  Primary Care Physician:  No PCP Per Patient Primary Gastroenterologist:  none Reason for Consultation:  GI bleed  HPI: Ashlee Thompson is a 20 y.o. female who lives in [place] and goes to [employer]. She has a history of diabetes and possible history of myocardial infarction. Patient admitted this am with hematemesis in setting of NSAIDS which she is taking for shoulder injury. Patient presented to ED with headache, body aches and nausea. Began vomiting yesterday. Initially emesis non bloody then "dark" . Subsequently vomited bright red blood. No prior history of GI bleeding. Stools have been loose but not melenic over last couple of days. Patient has occasional acid reflux but no other chronic GI complaints.    Past Medical History:  Diagnosis Date  . Anxiety   . Diabetes mellitus without complication (HCC)   . Myocardial infarction     History reviewed. No pertinent surgical history.  Prior to Admission medications   Medication Sig Start Date End Date Taking? Authorizing Provider  etonogestrel (NEXPLANON) 68 MG IMPL implant 1 each by Subdermal route once. Implanted November 2016   Yes Historical Provider, MD  FLUoxetine (PROZAC) 10 MG capsule Take 10 mg by mouth daily at 12 noon.    Yes Historical Provider, MD  cyclobenzaprine (FLEXERIL) 10 MG tablet Take 1 tablet (10 mg total) by mouth 2 (two) times daily as needed for muscle spasms. Patient not taking: Reported on 07/07/2016 06/25/16   Antony Madura, PA-C  dicyclomine (BENTYL) 20 MG tablet Take 1 tablet (20 mg total) by mouth every 6 (six) hours as needed for spasms. Patient not taking: Reported on 07/07/2016 05/05/16   Raeford Razor, MD  ondansetron (ZOFRAN ODT) 4 MG disintegrating tablet Take 1 tablet (4 mg total) by mouth every 8 (eight) hours as needed for nausea or vomiting. Patient not taking: Reported on 07/07/2016 01/23/16   [facility] Ward, PA-C  ondansetron (ZOFRAN ODT) 4 MG  disintegrating tablet Take 1 tablet (4 mg total) by mouth every 8 (eight) hours as needed for nausea or vomiting. Patient not taking: Reported on 07/07/2016 05/04/16   Lorne Skeens, MD  oxyCODONE-acetaminophen (PERCOCET/ROXICET) 5-325 MG tablet Take 1 tablet by mouth every 4 (four) hours as needed for severe pain. Patient not taking: Reported on 07/07/2016 05/05/16   Raeford Razor, MD  polyethylene glycol [facility]) packet Take 17 g by mouth daily. Patient not taking: Reported on 07/07/2016 05/04/16   Lorne Skeens, MD  polyethylene glycol [facility]) packet Take 17 g by mouth daily. Patient not taking: Reported on 07/07/2016 05/17/16   Vanetta Mulders, MD    Current Facility-Administered Medications  Medication Dose Route Frequency Provider Last Rate Last Dose  . 0.9 %  sodium chloride infusion   Intravenous Continuous Pete Glatter, MD 50 mL/hr at 07/07/16 0749    . pantoprazole (PROTONIX) 80 mg in sodium chloride 0.9 % 250 mL (0.32 mg/mL) infusion  8 mg/hr Intravenous Continuous Pete Glatter, MD 25 mL/hr at 07/07/16 0839 8 mg/hr at 07/07/16 0839  . [START ON 07/10/2016] pantoprazole (PROTONIX) injection 40 mg  40 mg Intravenous Q12H Pete Glatter, MD      . sodium chloride flush (NS) 0.9 % injection 3 mL  3 mL Intravenous Q12H Pete Glatter, MD        Allergies as of 07/07/2016 - Review Complete 07/07/2016  Allergen Reaction Noted  . Tramadol Itching 01/22/2016  . Latex Itching, Swelling, and Rash 05/05/2016  Family History  Problem Relation Age of Onset  . Hyperlipidemia Mother   . Mental illness Mother   . Hyperlipidemia Father   . Mental illness Brother   . Heart disease Paternal Grandfather   . Hyperlipidemia Paternal Grandfather   . Hypertension Paternal Grandfather   . CAD Other   . Cancer Other     Social History   Social History  . Marital status: Single    Spouse name: N/A  . Number of children: N/A  . Years of education:  N/A   Occupational History  . Not on file.   Social History Main Topics  . Smoking status: Current Some Day Smoker    Types: Cigarettes, Cigars  . Smokeless tobacco: Current User  . Alcohol use Yes     Comment: social  . Drug use: No  . Sexual activity: Yes    Birth control/ protection: Implant   Other Topics Concern  . Not on file   Social History Narrative  . No narrative on file    Review of Systems: All systems reviewed and negative except where noted in HPI.  Physical Exam: Vital signs in last 24 hours: Temp:  [98 F (36.7 C)-98.4 F (36.9 C)] 98.4 F (36.9 C) (10/21 0836) Pulse Rate:  [57-104] 84 (10/21 0836) Resp:  [14-33] 17 (10/21 0836) BP: (86-128)/(59-108) 109/60 (10/21 0836) SpO2:  [98 %-100 %] 100 % (10/21 0836) Weight:  [302 lb (137 kg)-303 lb 9.6 oz (137.7 kg)] 303 lb 9.6 oz (137.7 kg) (10/21 1610)   General:   Alert,  Obese black female in NAD Head:  Normocephalic and atraumatic. Eyes:  Sclera clear, no icterus.   Conjunctiva pink. Ears:  Normal auditory acuity. Nose:  No deformity, discharge,  or lesions. Neck:  Supple; no masses Lungs:  Clear throughout to auscultation.   No wheezes, crackles, or rhonchi. No peripheral edema Heart:  Regular rate and rhythm; no murmurs, clicks, rubs,  or gallops. Abdomen:  Soft, obese, nontender, BS active,no palp mass   Rectal:  Deferred  Msk:  Symmetrical without gross deformities.  Pulses:  Normal pulses noted. Neurologic:  Alert and  oriented x4;  grossly normal neurologically. Skin:  Intact without significant lesions or rashes.. Psych:  Alert and cooperative. Normal mood and affect.  Intake/Output from previous day: 10/20 0701 - 10/21 0700 In: 1050 [IV Piggyback:1050] Out: -  Intake/Output this shift: No intake/output data recorded.  Lab Results:  Recent Labs  07/07/16 0227 07/07/16 0520 07/07/16 0736  WBC 12.2* 8.9  --   HGB 13.4 11.6* 12.1  HCT 41.5 36.9 38.4  PLT 332 289  --     BMET  Recent Labs  07/07/16 0227  NA 135  K 4.1  CL 105  CO2 23  GLUCOSE 93  BUN 13  CREATININE 0.64  CALCIUM 9.1   LFT  Recent Labs  07/07/16 0227  PROT 6.7  ALBUMIN 3.4*  AST 21  ALT 23  ALKPHOS 96  BILITOT 0.2*   PT/INR  Recent Labs  07/07/16 0715  LABPROT 12.7  INR 0.95    Studies/Results: Dg Chest 2 View  Result Date: 07/07/2016 CLINICAL DATA:  20 year old female with cough. EXAM: CHEST  2 VIEW COMPARISON:  Chest radiograph dated 05/17/2016 FINDINGS: The heart size and mediastinal contours are within normal limits. Both lungs are clear. The visualized skeletal structures are unremarkable. IMPRESSION: No active cardiopulmonary disease. Electronically Signed   By: Elgie Collard M.D.   On: 07/07/2016 04:42  IMPRESSION / PLAN:  731. 20 year old female with headache, body aches and nausea. Afebrile, normal WBC. Flu panel pending.   2. Nausea, vomiting / hematemesis in setting of NSAID use. Hemodynamically stable. BUN normal. Hgb at baseline at 12.1. No melena stools. Some possibilities include mallory-weiss tear, PUD.   -npo -EGD today. The risks and benefits of EGD were discussed and the patient agrees to proceed.  -continue PPI gtt for now. -H+H stable, recheck another at 2pm   2. Diabetes?  Told she had " pre-diabetes" Was on Metformin. A1c pending  3. Morbid obesity.   Ashlee Thompson  07/07/2016, 9:44 AM  Pager number [phone]

## 2016-07-07 NOTE — ED Notes (Signed)
Pt. ambulated in hall.

## 2016-07-07 NOTE — ED Notes (Signed)
Patient transported to X-ray 

## 2016-07-07 NOTE — ED Triage Notes (Signed)
Pt started throwing up today had sudden on set. Pt has no diarrhea and feeling weak

## 2016-07-07 NOTE — H&P (View-Only) (Signed)
Referring Provider:  [facility]  Primary Care Physician:  No PCP Per Patient Primary Gastroenterologist:  none Reason for Consultation:  GI bleed  HPI: Ashlee Thompson is a 20 y.o. female who lives in [place] and goes to [employer]. She has a history of diabetes and possible history of myocardial infarction. Patient admitted this am with hematemesis in setting of NSAIDS which she is taking for shoulder injury. Patient presented to ED with headache, body aches and nausea. Began vomiting yesterday. Initially emesis non bloody then "dark" . Subsequently vomited bright red blood. No prior history of GI bleeding. Stools have been loose but not melenic over last couple of days. Patient has occasional acid reflux but no other chronic GI complaints.    Past Medical History:  Diagnosis Date  . Anxiety   . Diabetes mellitus without complication (HCC)   . Myocardial infarction     History reviewed. No pertinent surgical history.  Prior to Admission medications   Medication Sig Start Date End Date Taking? Authorizing Provider  etonogestrel (NEXPLANON) 68 MG IMPL implant 1 each by Subdermal route once. Implanted November 2016   Yes Historical Provider, MD  FLUoxetine (PROZAC) 10 MG capsule Take 10 mg by mouth daily at 12 noon.    Yes Historical Provider, MD  cyclobenzaprine (FLEXERIL) 10 MG tablet Take 1 tablet (10 mg total) by mouth 2 (two) times daily as needed for muscle spasms. Patient not taking: Reported on 07/07/2016 06/25/16   Antony Madura, PA-C  dicyclomine (BENTYL) 20 MG tablet Take 1 tablet (20 mg total) by mouth every 6 (six) hours as needed for spasms. Patient not taking: Reported on 07/07/2016 05/05/16   Raeford Razor, MD  ondansetron (ZOFRAN ODT) 4 MG disintegrating tablet Take 1 tablet (4 mg total) by mouth every 8 (eight) hours as needed for nausea or vomiting. Patient not taking: Reported on 07/07/2016 01/23/16   [facility] Ward, PA-C  ondansetron (ZOFRAN ODT) 4 MG  disintegrating tablet Take 1 tablet (4 mg total) by mouth every 8 (eight) hours as needed for nausea or vomiting. Patient not taking: Reported on 07/07/2016 05/04/16   Lorne Skeens, MD  oxyCODONE-acetaminophen (PERCOCET/ROXICET) 5-325 MG tablet Take 1 tablet by mouth every 4 (four) hours as needed for severe pain. Patient not taking: Reported on 07/07/2016 05/05/16   Raeford Razor, MD  polyethylene glycol [facility]) packet Take 17 g by mouth daily. Patient not taking: Reported on 07/07/2016 05/04/16   Lorne Skeens, MD  polyethylene glycol [facility]) packet Take 17 g by mouth daily. Patient not taking: Reported on 07/07/2016 05/17/16   Vanetta Mulders, MD    Current Facility-Administered Medications  Medication Dose Route Frequency Provider Last Rate Last Dose  . 0.9 %  sodium chloride infusion   Intravenous Continuous Pete Glatter, MD 50 mL/hr at 07/07/16 0749    . pantoprazole (PROTONIX) 80 mg in sodium chloride 0.9 % 250 mL (0.32 mg/mL) infusion  8 mg/hr Intravenous Continuous Pete Glatter, MD 25 mL/hr at 07/07/16 0839 8 mg/hr at 07/07/16 0839  . [START ON 07/10/2016] pantoprazole (PROTONIX) injection 40 mg  40 mg Intravenous Q12H Pete Glatter, MD      . sodium chloride flush (NS) 0.9 % injection 3 mL  3 mL Intravenous Q12H Pete Glatter, MD        Allergies as of 07/07/2016 - Review Complete 07/07/2016  Allergen Reaction Noted  . Tramadol Itching 01/22/2016  . Latex Itching, Swelling, and Rash 05/05/2016  Family History  Problem Relation Age of Onset  . Hyperlipidemia Mother   . Mental illness Mother   . Hyperlipidemia Father   . Mental illness Brother   . Heart disease Paternal Grandfather   . Hyperlipidemia Paternal Grandfather   . Hypertension Paternal Grandfather   . CAD Other   . Cancer Other     Social History   Social History  . Marital status: Single    Spouse name: N/A  . Number of children: N/A  . Years of education:  N/A   Occupational History  . Not on file.   Social History Main Topics  . Smoking status: Current Some Day Smoker    Types: Cigarettes, Cigars  . Smokeless tobacco: Current User  . Alcohol use Yes     Comment: social  . Drug use: No  . Sexual activity: Yes    Birth control/ protection: Implant   Other Topics Concern  . Not on file   Social History Narrative  . No narrative on file    Review of Systems: All systems reviewed and negative except where noted in HPI.  Physical Exam: Vital signs in last 24 hours: Temp:  [98 F (36.7 C)-98.4 F (36.9 C)] 98.4 F (36.9 C) (10/21 0836) Pulse Rate:  [57-104] 84 (10/21 0836) Resp:  [14-33] 17 (10/21 0836) BP: (86-128)/(59-108) 109/60 (10/21 0836) SpO2:  [98 %-100 %] 100 % (10/21 0836) Weight:  [302 lb (137 kg)-303 lb 9.6 oz (137.7 kg)] 303 lb 9.6 oz (137.7 kg) (10/21 1610)   General:   Alert,  Obese black female in NAD Head:  Normocephalic and atraumatic. Eyes:  Sclera clear, no icterus.   Conjunctiva pink. Ears:  Normal auditory acuity. Nose:  No deformity, discharge,  or lesions. Neck:  Supple; no masses Lungs:  Clear throughout to auscultation.   No wheezes, crackles, or rhonchi. No peripheral edema Heart:  Regular rate and rhythm; no murmurs, clicks, rubs,  or gallops. Abdomen:  Soft, obese, nontender, BS active,no palp mass   Rectal:  Deferred  Msk:  Symmetrical without gross deformities.  Pulses:  Normal pulses noted. Neurologic:  Alert and  oriented x4;  grossly normal neurologically. Skin:  Intact without significant lesions or rashes.. Psych:  Alert and cooperative. Normal mood and affect.  Intake/Output from previous day: 10/20 0701 - 10/21 0700 In: 1050 [IV Piggyback:1050] Out: -  Intake/Output this shift: No intake/output data recorded.  Lab Results:  Recent Labs  07/07/16 0227 07/07/16 0520 07/07/16 0736  WBC 12.2* 8.9  --   HGB 13.4 11.6* 12.1  HCT 41.5 36.9 38.4  PLT 332 289  --     BMET  Recent Labs  07/07/16 0227  NA 135  K 4.1  CL 105  CO2 23  GLUCOSE 93  BUN 13  CREATININE 0.64  CALCIUM 9.1   LFT  Recent Labs  07/07/16 0227  PROT 6.7  ALBUMIN 3.4*  AST 21  ALT 23  ALKPHOS 96  BILITOT 0.2*   PT/INR  Recent Labs  07/07/16 0715  LABPROT 12.7  INR 0.95    Studies/Results: Dg Chest 2 View  Result Date: 07/07/2016 CLINICAL DATA:  20 year old female with cough. EXAM: CHEST  2 VIEW COMPARISON:  Chest radiograph dated 05/17/2016 FINDINGS: The heart size and mediastinal contours are within normal limits. Both lungs are clear. The visualized skeletal structures are unremarkable. IMPRESSION: No active cardiopulmonary disease. Electronically Signed   By: Elgie Collard M.D.   On: 07/07/2016 04:42  IMPRESSION / PLAN:  731. 20 year old female with headache, body aches and nausea. Afebrile, normal WBC. Flu panel pending.   2. Nausea, vomiting / hematemesis in setting of NSAID use. Hemodynamically stable. BUN normal. Hgb at baseline at 12.1. No melena stools. Some possibilities include mallory-weiss tear, PUD.   -npo -EGD today. The risks and benefits of EGD were discussed and the patient agrees to proceed.  -continue PPI gtt for now. -H+H stable, recheck another at 2pm   2. Diabetes?  Told she had " pre-diabetes" Was on Metformin. A1c pending  3. Morbid obesity.   Willette Clusteraula Guenther  07/07/2016, 9:44 AM  Pager number [phone]

## 2016-07-07 NOTE — Op Note (Signed)
[facility] Patient Name: Ashlee Thompson Procedure Date : 07/07/2016 MRN: [id number] Attending MD: Willaim RayasSteven P. Cassandra Harbold MD, MD Date of Birth: 01-30-1996 CSN: [id number] Age: [age] Admit Type: Inpatient Procedure:                Upper GI endoscopy Indications:              Suspected upper gastrointestinal bleeding -                            hematemesis in the setting of nausea / vomiting,                            possible viral symptoms Providers:                Willaim RayasSteven P. Dasiah Hooley MD, MD, Michel BickersLisa R. Straughn, RN,                            Kandice RobinsonsGuillaume Awaka, Technician Referring MD:              Medicines:                Fentanyl 100 micrograms IV, Midazolam 8 mg IV,                            Diphenhydramine 50 mg IV Complications:            No immediate complications. Estimated blood loss:                            None. Estimated Blood Loss:     Estimated blood loss: none. Procedure:                Pre-Anesthesia Assessment:                           - Prior to the procedure, a History and Physical                            was performed, and patient medications and                            allergies were reviewed. The patient's tolerance of                            previous anesthesia was also reviewed. The risks                            and benefits of the procedure and the sedation                            options and risks were discussed with the patient.                            All questions were answered, and informed consent  was obtained. Prior Anticoagulants: The patient has                            taken no previous anticoagulant or antiplatelet                            agents. ASA Grade Assessment: II - A patient with                            mild systemic disease. After reviewing the risks                            and benefits, the patient was deemed in                            satisfactory condition to  undergo the procedure.                           After obtaining informed consent, the endoscope was                            passed under direct vision. Throughout the                            procedure, the patient's blood pressure, pulse, and                            oxygen saturations were monitored continuously. The                            EG-[id number] ([id number]) scope was introduced through the                            mouth, and advanced to the second part of duodenum.                            The upper GI endoscopy was accomplished without                            difficulty. The patient tolerated the procedure                            poorly. Scope In: Scope Out: Findings:      Esophagogastric landmarks were identified: the Z-line was found at 38       cm, the gastroesophageal junction was found at 38 cm and the upper       extent of the gastric folds was found at 40 cm from the incisors.      A 2 cm hiatal hernia was present.      The exam of the esophagus was otherwise normal. No obvious abnormalities       appreciated.      The entire examined stomach was normal. No focal ulceration or erosions       appreciated.      The duodenal bulb and  second portion of the duodenum were normal. No       evidence of pathology. Impression:               - Esophagogastric landmarks identified.                           - 2 cm hiatal hernia.                           - Normal stomach.                           - Normal duodenal bulb and second portion of the                            duodenum.                           - No blood or stigmata of bleeding noted on this                            exam.                           Suspect patient could have had a small Mallory                            Weiss tear in the setting of vomiting which as                            since healed or not appreciated on this exam. No                            active bleeding. Moderate  Sedation:      Moderate (conscious) sedation was administered by the endoscopy nurse       and supervised by the endoscopist. The following parameters were       monitored: oxygen saturation, heart rate, blood pressure, and response       to care. Total physician intraservice time was 25 minutes. Recommendation:           - Return patient to hospital ward for ongoing care.                           - Advance diet as tolerated.                           - Continue present medications - PPI BID                           - NO NSAIDs                           - Zofran / antiemetics to minimize vomiting                           - GI will sign off for now, no  evidence of any                            signfiicant bleeding, Hgb normal. Can discharge                            home from GI perspective. Please call with                            questions / concerns Procedure Code(s):        --- Professional ---                           [phone], Esophagogastroduodenoscopy, flexible,                            transoral; diagnostic, including collection of                            specimen(s) by brushing or washing, when performed                            (separate procedure)                           [id number], Moderate sedation services provided by the                            same physician or other qualified health care                            professional performing the diagnostic or                            therapeutic service that the sedation supports,                            requiring the presence of an independent trained                            observer to assist in the monitoring of the                            patient's level of consciousness and physiological                            status; initial 15 minutes of intraservice time,                            patient age 21 years or older                           [phone], Moderate sedation services; each additional                             15 minutes intraservice  time Diagnosis Code(s):        --- Professional ---                           K44.9, Diaphragmatic hernia without obstruction or                            gangrene CPT copyright 2016 [employer]. All rights reserved. The codes documented in this report are preliminary and upon coder review may  be revised to meet current compliance requirements. Viviann Spare P. Shilynn Hoch MD, MD 07/07/2016 1:00:36 PM This report has been signed electronically. Number of Addenda: 0

## 2016-07-08 ENCOUNTER — Inpatient Hospital Stay (HOSPITAL_COMMUNITY): Payer: Self-pay

## 2016-07-08 DIAGNOSIS — R51 Headache: Secondary | ICD-10-CM

## 2016-07-08 LAB — COMPREHENSIVE METABOLIC PANEL
ALT: 20 U/L (ref 14–54)
ANION GAP: 6 (ref 5–15)
AST: 20 U/L (ref 15–41)
Albumin: 3 g/dL — ABNORMAL LOW (ref 3.5–5.0)
Alkaline Phosphatase: 83 U/L (ref 38–126)
BUN: 8 mg/dL (ref 6–20)
CHLORIDE: 107 mmol/L (ref 101–111)
CO2: 24 mmol/L (ref 22–32)
Calcium: 8.9 mg/dL (ref 8.9–10.3)
Creatinine, Ser: 0.66 mg/dL (ref 0.44–1.00)
GFR calc non Af Amer: >60 mL/min (ref >60)
Glucose, Bld: 79 mg/dL (ref 65–99)
Potassium: 3.7 mmol/L (ref 3.5–5.1)
SODIUM: 137 mmol/L (ref 135–145)
Total Bilirubin: 0.8 mg/dL (ref 0.3–1.2)
Total Protein: 5.9 g/dL — ABNORMAL LOW (ref 6.5–8.1)

## 2016-07-08 LAB — HEMOGLOBIN AND HEMATOCRIT, BLOOD
HCT: 36.3 % (ref 36.0–46.0)
HEMOGLOBIN: 11.3 g/dL — AB (ref 12.0–15.0)

## 2016-07-08 LAB — CBC
HCT: 38.3 % (ref 36.0–46.0)
HEMOGLOBIN: 12 g/dL (ref 12.0–15.0)
MCH: 23.1 pg — AB (ref 26.0–34.0)
MCHC: 31.3 g/dL (ref 30.0–36.0)
MCV: 73.7 fL — AB (ref 78.0–100.0)
Platelets: 316 10*3/uL (ref 150–400)
RBC: 5.2 MIL/uL — AB (ref 3.87–5.11)
RDW: 14.6 % (ref 11.5–15.5)
WBC: 10 10*3/uL (ref 4.0–10.5)

## 2016-07-08 LAB — HEMOGLOBIN A1C
HEMOGLOBIN A1C: 5.7 % — AB (ref 4.8–5.6)
Mean Plasma Glucose: 117 mg/dL

## 2016-07-08 LAB — GLUCOSE, CAPILLARY
GLUCOSE-CAPILLARY: 74 mg/dL (ref 65–99)
GLUCOSE-CAPILLARY: 74 mg/dL (ref 65–99)

## 2016-07-08 MED ORDER — ACETAMINOPHEN 325 MG PO TABS
650.0000 mg | ORAL_TABLET | Freq: Four times a day (QID) | ORAL | Status: DC | PRN
  Administered 2016-07-08: 650 mg via ORAL
  Filled 2016-07-08: qty 2

## 2016-07-08 MED ORDER — METOCLOPRAMIDE HCL 5 MG/ML IJ SOLN
10.0000 mg | Freq: Once | INTRAMUSCULAR | Status: AC
  Administered 2016-07-08: 10 mg via INTRAVENOUS
  Filled 2016-07-08: qty 2

## 2016-07-08 MED ORDER — PANTOPRAZOLE SODIUM 40 MG PO TBEC
40.0000 mg | DELAYED_RELEASE_TABLET | Freq: Every day | ORAL | Status: DC
  Administered 2016-07-08 – 2016-07-09 (×2): 40 mg via ORAL
  Filled 2016-07-08 (×2): qty 1

## 2016-07-08 MED ORDER — GADOBENATE DIMEGLUMINE 529 MG/ML IV SOLN
20.0000 mL | Freq: Once | INTRAVENOUS | Status: AC
  Administered 2016-07-08: 20 mL via INTRAVENOUS

## 2016-07-08 MED ORDER — DIPHENHYDRAMINE HCL 50 MG/ML IJ SOLN
25.0000 mg | Freq: Once | INTRAMUSCULAR | Status: AC
  Administered 2016-07-08: 25 mg via INTRAVENOUS
  Filled 2016-07-08: qty 1

## 2016-07-08 MED ORDER — KETOROLAC TROMETHAMINE 30 MG/ML IJ SOLN
30.0000 mg | Freq: Once | INTRAMUSCULAR | Status: AC
  Administered 2016-07-08: 30 mg via INTRAVENOUS
  Filled 2016-07-08: qty 1

## 2016-07-08 MED ORDER — ONDANSETRON 4 MG PO TBDP: 4.0000 mg | ORAL_TABLET | Freq: Three times a day (TID) | ORAL | 0 refills | Status: DC | PRN

## 2016-07-08 MED ORDER — PANTOPRAZOLE SODIUM 40 MG PO TBEC: 40.0000 mg | DELAYED_RELEASE_TABLET | Freq: Every day | ORAL | 0 refills | Status: DC

## 2016-07-08 NOTE — Consult Note (Addendum)
NEURO HOSPITALIST CONSULT NOTE   Requestig physician: Dr. Jerral Ralph   Reason for Consult: Worsening headache and blurred vision   History obtained from:  Patient    HPI:                                                                                                                                          Ashlee Thompson is an 20 y.o. female with history diabetes as well as myocardial infarct in the past. Patient presented the ED with headache she states she had for the past 5 hours however in further discussion she states her headache was likely secondary to the retching and vomiting. Patient states that she did not have a significant headache today until she was taken off her Protonix. In further discussion patient is not significantly clear about her waxing and waning headache but states may be over the past week she's been having a headache that has been on and off Maybe longer. Patient admits to gaining a significant amount of weight since last March possibly 60 pound. The current headache she is having is described as a pounding, " with her heartbeat", located over the anterior aspect of her forehead and around her left eye and in the frontal area with vision changes. She'd knowledge is that she does have blurred vision. She rates her headache as a 5/10. She denies ever having a headache of this type--or severity. Patient also admits to having some nausea and vomiting with the headache.  Past Medical History:  Diagnosis Date  . Anxiety   . Diabetes mellitus without complication (HCC)   . Myocardial infarction     History reviewed. No pertinent surgical history.  Family History  Problem Relation Age of Onset  . Hyperlipidemia Mother   . Mental illness Mother   . Hyperlipidemia Father   . Mental illness Brother   . Heart disease Paternal Grandfather   . Hyperlipidemia Paternal Grandfather   . Hypertension Paternal Grandfather   . CAD Other   . Cancer Other       Social History:  reports that she has been smoking Cigarettes and Cigars.  She uses smokeless tobacco. She reports that she drinks alcohol. She reports that she does not use drugs.  Allergies  Allergen Reactions  . Tramadol Itching  . Latex Itching, Swelling and Rash    Reaction to condoms    MEDICATIONS:  Prior to Admission:  Prescriptions Prior to Admission  Medication Sig Dispense Refill Last Dose  . etonogestrel (NEXPLANON) 68 MG IMPL implant 1 each by Subdermal route once. Implanted November 2016   07/07/2016 at Unknown time  . FLUoxetine (PROZAC) 10 MG capsule Take 10 mg by mouth daily at 12 noon.    07/06/2016 at Unknown time  . cyclobenzaprine (FLEXERIL) 10 MG tablet Take 1 tablet (10 mg total) by mouth 2 (two) times daily as needed for muscle spasms. (Patient not taking: Reported on 07/07/2016) 20 tablet 0 Not Taking at Unknown time  . dicyclomine (BENTYL) 20 MG tablet Take 1 tablet (20 mg total) by mouth every 6 (six) hours as needed for spasms. (Patient not taking: Reported on 07/07/2016) 12 tablet 0 Not Taking at Unknown time  . ondansetron (ZOFRAN ODT) 4 MG disintegrating tablet Take 1 tablet (4 mg total) by mouth every 8 (eight) hours as needed for nausea or vomiting. (Patient not taking: Reported on 07/07/2016) 20 tablet 0 Completed Course at Unknown time  . ondansetron (ZOFRAN ODT) 4 MG disintegrating tablet Take 1 tablet (4 mg total) by mouth every 8 (eight) hours as needed for nausea or vomiting. (Patient not taking: Reported on 07/07/2016) 20 tablet 0 Not Taking at Unknown time  . oxyCODONE-acetaminophen (PERCOCET/ROXICET) 5-325 MG tablet Take 1 tablet by mouth every 4 (four) hours as needed for severe pain. (Patient not taking: Reported on 07/07/2016) 10 tablet 0 Not Taking at Unknown time  . polyethylene glycol (MIRALAX / GLYCOLAX) packet Take 17 g by  mouth daily. (Patient not taking: Reported on 07/07/2016) 14 each 0 Not Taking at Unknown time  . polyethylene glycol (MIRALAX) packet Take 17 g by mouth daily. (Patient not taking: Reported on 07/07/2016) 14 each 3 Not Taking at Unknown time   Scheduled: . metoCLOPramide (REGLAN) injection  10 mg Intravenous Once   And  . diphenhydrAMINE  25 mg Intravenous Once  . pantoprazole  40 mg Oral Q1200  . sodium chloride flush  3 mL Intravenous Q12H     ROS:                                                                                                                                       History obtained from the patient  General ROS: Positive for - weight gain Psychological ROS: negative for - behavioral disorder, hallucinations, memory difficulties, mood swings or suicidal ideation Ophthalmic ROS: Positive for - blurry vision, ENT ROS: negative for - epistaxis, nasal discharge, oral lesions, sore throat, tinnitus or vertigo Allergy and Immunology ROS: negative for - hives or itchy/watery eyes Hematological and Lymphatic ROS: negative for - bleeding problems, bruising or swollen lymph nodes Endocrine ROS: negative for - galactorrhea, hair pattern changes, polydipsia/polyuria or temperature intolerance Respiratory ROS: negative for - cough, hemoptysis, shortness of breath or wheezing Cardiovascular ROS: negative for - chest pain, dyspnea on exertion, edema or  irregular heartbeat Gastrointestinal ROS: negative for - abdominal pain, diarrhea, hematemesis, nausea/vomiting or stool incontinence Genito-Urinary ROS: negative for - dysuria, hematuria, incontinence or urinary frequency/urgency Musculoskeletal ROS: negative for - joint swelling or muscular weakness Neurological ROS: as noted in HPI Dermatological ROS: negative for rash and skin lesion changes   Blood pressure (!) 97/58, pulse 78, temperature 98.3 F (36.8 C), temperature source Oral, resp. rate 17, height 5' 5.5" (1.664 m), weight  (!) 137.8 kg (303 lb 12.7 oz), SpO2 98 %.   Neurologic Examination:                                                                                                      HEENT-  Normocephalic, no lesions, without obvious abnormality.  Normal external eye and conjunctiva.  Normal TM's bilaterally.  Normal auditory canals and external ears. Normal external nose, mucus membranes and septum.  Normal pharynx. Cardiovascular- S1, S2 normal, pulses palpable throughout   Lungs- chest clear, no wheezing, rales, normal symmetric air entry, Heart exam - S1, S2 normal, no murmur, no gallop, rate regular Abdomen- normal findings: bowel sounds normal Extremities- no edema Lymph-no adenopathy palpable Musculoskeletal-no joint tenderness, deformity or swelling Skin-warm and dry, no hyperpigmentation, vitiligo, or suspicious lesions  Neurological Examination Mental Status: Alert, oriented, thought content appropriate.  Speech fluent without evidence of aphasia.  Able to follow 3 step commands without difficulty. Cranial Nerves: II: Discs--No ONH edema; Visual fields grossly normal, pupils equal, round, reactive to light and accommodation III,IV, VI: ptosis not present, extra-ocular motions intact bilaterally V,VII: smile symmetric, facial light touch sensation normal bilaterally VIII: hearing normal bilaterally IX,X: uvula rises symmetrically XI: bilateral shoulder shrug XII: midline tongue extension Motor: Right : Upper extremity   5/5    Left:     Upper extremity   5/5  Lower extremity   5/5     Lower extremity   5/5 --Negative meningeal signs when moving patient's neck, Tone and bulk:normal tone throughout; no atrophy noted Sensory: Pinprick and light touch intact throughout, bilaterally Deep Tendon Reflexes: 2+ and symmetric throughout Plantars: Right: downgoing   Left: downgoing Cerebellar: normal finger-to-nose,and normal heel-to-shin test Gait: Not tested      Lab Results: Basic  Metabolic Panel:  Recent Labs Lab 07/07/16 0227 07/08/16 0055  NA 135 137  K 4.1 3.7  CL 105 107  CO2 23 24  GLUCOSE 93 79  BUN 13 8  CREATININE 0.64 0.66  CALCIUM 9.1 8.9    Liver Function Tests:  Recent Labs Lab 07/07/16 0227 07/08/16 0055  AST 21 20  ALT 23 20  ALKPHOS 96 83  BILITOT 0.2* 0.8  PROT 6.7 5.9*  ALBUMIN 3.4* 3.0*    Recent Labs Lab 07/07/16 0227  LIPASE 36   No results for input(s): AMMONIA in the last 168 hours.  CBC:  Recent Labs Lab 07/07/16 0227 07/07/16 0520 07/07/16 0736 07/07/16 1404 07/07/16 1921 07/08/16 0055 07/08/16 0730  WBC 12.2* 8.9  --   --   --  10.0  --   HGB 13.4 11.6* 12.1 12.1  12.2 12.0 11.3*  HCT 41.5 36.9 38.4 38.4 38.1 38.3 36.3  MCV 73.8* 73.8*  --   --   --  73.7*  --   PLT 332 289  --   --   --  316  --     Cardiac Enzymes: No results for input(s): CKTOTAL, CKMB, CKMBINDEX, TROPONINI in the last 168 hours.  Lipid Panel: No results for input(s): CHOL, TRIG, HDL, CHOLHDL, VLDL, LDLCALC in the last 168 hours.  CBG:  Recent Labs Lab 07/07/16 2140 07/08/16 0014 07/08/16 0357  GLUCAP 80 74 74    Microbiology: Results for orders placed or performed during the hospital encounter of 05/04/16  Wet prep, genital     Status: Abnormal   Collection Time: 05/04/16  2:10 PM  Result Value Ref Range Status   Yeast Wet Prep HPF POC NONE SEEN NONE SEEN Final   Trich, Wet Prep NONE SEEN NONE SEEN Final   Clue Cells Wet Prep HPF POC NONE SEEN NONE SEEN Final   WBC, Wet Prep HPF POC MODERATE (A) NONE SEEN Final    Comment: MODERATE BACTERIA SEEN   Sperm NONE SEEN  Final    Coagulation Studies:  Recent Labs  07/07/16 0715  LABPROT 12.7  INR 0.95    Imaging: Dg Chest 2 View  Result Date: 07/07/2016 CLINICAL DATA:  20 year old female with cough. EXAM: CHEST  2 VIEW COMPARISON:  Chest radiograph dated 05/17/2016 FINDINGS: The heart size and mediastinal contours are within normal limits. Both lungs are  clear. The visualized skeletal structures are unremarkable. IMPRESSION: No active cardiopulmonary disease. Electronically Signed   By: Elgie CollardArash  Radparvar M.D.   On: 07/07/2016 04:42   Dg Lumbar Spine 2-3 Views  Result Date: 07/08/2016 CLINICAL DATA:  Fall, lower back pain EXAM: LUMBAR SPINE - 2-3 VIEW COMPARISON:  05/17/2016 FINDINGS: Three views of the lumbar spine submitted. No acute fracture or subluxation. Alignment, disc spaces and vertebral body heights are preserved. IMPRESSION: Negative. Electronically Signed   By: Natasha MeadLiviu  Pop M.D.   On: 07/08/2016 09:08     Assessment/Plan: This is a pleasant 20 year old female who has had a history of on and off headaches. Today she noted after the Protonix was stopped in the IV was taken out her headache came back significantly. Headache is located over the frontal region of her head and over the left eye. As a pounding quality. Patient does admit to have gaining significant amount of weight since March 60 pounds Patient does state she has some blurred vision with the headache. At this time the concern is for possible increased intracranial pressure versus venous thrombosis. Patient is very unclear about how her headaches have been ongoing, if her she said just the last 2 days then she stated possibly longer and off and on for a week or longer.  Recommend: Patient is complaining of blurry vision, severe pressure in the head, vision changes, hearing changes and heart beat in her ear, 60 pound weight gain and March. Fundoscopic exam is normal.   - MRI of the brain and orbits. Need to evaluate for signs of idiopathic increased hypertension such as empty sella and increased fluid around the optic nerves.If this is seen I recommend LP otherwise treat symptomatically.   - MRV to evaluate for cerebral venous thrombosis  Personally examined patient and images, and have participated in and made any corrections needed to history, physical, neuro exam,assessment  and plan as stated above.  I have personally obtained the history, evaluated  lab date, reviewed imaging studies and agree with radiology interpretations.    Naomie Dean, MD [facility] [phone]

## 2016-07-08 NOTE — Procedures (Signed)
Indication: Headache, opening pressure  Risks of the procedure were dicussed with the patient including post-LP headache, bleeding, infection, weakness/numbness of legs(radiculopathy), death.  The patient proxy agreed and written consent was obtained.   The patient was prepped and draped, and using sterile technique a 20 gauge quinke spinal needle was inserted in the L3-4 space. CSF was not able to be obtained and the LP will need to be performed by Flouroscopy  Artemio Alyoni Ahern, MD [facility]

## 2016-07-08 NOTE — Progress Notes (Signed)
Patient is experiencing headache with nausea, vomiting, and blurred vision. Vital signs are stable. MD notified and orders obtained for NPO diet and STAT CT of head.

## 2016-07-08 NOTE — Discharge Summary (Addendum)
PATIENT DETAILS Name: Ashlee Thompson Age: 20 y.o. Sex: female Date of Birth: 16-Sep-1996 MRN: [id number]. Admitting Physician: Pete Glatter, MD PCP:No PCP Per Patient  Admit Date: 07/07/2016 Discharge date: 07/09/2016  Recommendations for Outpatient Follow-up:  1. Follow up with PCP in 1-2 weeks 2. Please obtain BMP/CBC in one week   Admitted From:  Home  Disposition: Home   Home Health: No  Equipment/Devices: None  Discharge Condition: Stable  CODE STATUS: FULL CODE  Diet recommendation:   Regular   Brief Summary: See H&P, Labs, Consult and Test reports for all details in brief, patient was admitted for elevation of vomiting with hematemesis. She apparently was on nonsteroidal anti-inflammatory medications for his shoulder injury. She was subsequently admitted for further evaluation and treatment. Hospital course complicated by worsening headaches and blurry vision-underwent work up-see below for details.   Brief Hospital Course: Transient hematemesis/vomiting: Resolved, underwent endoscopy on 10/22 without any evidence of bleeding foci. Recommendations are to stop taking nonsteroidal anti-inflammatory medications, and continue supportive measures. Stable for discharge today. Underwent endoscopy without any major findings.  Migraine Headache:Developed headache and blurry vision on 10/22-Neurology was consulted-initially benign intracranial HTN was contemplated-but MRI/MRV brain was negative, LP was done-opening pressure was not elevated. It is now thought she has migraine Headaches-she was given one gram of Depacon along with reglan and benadryl earlier today-with COMPLETE resolution of headache. She has been asked to take tylenol prn, and follow up with PCP for further management if headache recurred in the future.  Morbid obesity: Counseled   Procedures/Studies: EGD 10/21>> LP 10/23   Discharge Diagnoses:  Principal Problem:   UGI bleed Active  Problems:   History of diabetes mellitus   Morbid obesity (HCC)   Hematemesis with nausea   GI bleed   Headache   Discharge Instructions:  Activity:  As tolerated with Full fall precautions use walker/cane & assistance as needed  Discharge Instructions    Call MD for:  persistant nausea and vomiting    Complete by:  As directed    Diet general    Complete by:  As directed    Discharge instructions    Complete by:  As directed    Follow with Primary MD  -call [facility] on Monday-10/23 for a follow-up appointment  If you have pain-use Tylenol-do not use nonsteroidal anti-inflammatory medications-like Advil, Motrin, ibuprofen  Please get a complete blood count and chemistry panel checked by your Primary MD at your next visit, and again as instructed by your Primary MD.  Get Medicines reviewed and adjusted: Please take all your medications with you for your next visit with your Primary MD  Laboratory/radiological data: Please request your Primary MD to go over all hospital tests and procedure/radiological results at the follow up, please ask your Primary MD to get all Hospital records sent to his/her office.  In some cases, they will be blood work, cultures and biopsy results pending at the time of your discharge. Please request that your primary care M.D. follows up on these results.  Also Note the following: If you experience worsening of your admission symptoms, develop shortness of breath, life threatening emergency, suicidal or homicidal thoughts you must seek medical attention immediately by calling 911 or calling your MD immediately  if symptoms less severe.  You must read complete instructions/literature along with all the possible adverse reactions/side effects for all the Medicines you take and that have been prescribed to you. Take any new Medicines  after you have completely understood and accpet all the possible adverse  reactions/side effects.   Do not drive when taking Pain medications or sleeping medications (Benzodaizepines)  Do not take more than prescribed Pain, Sleep and Anxiety Medications. It is not advisable to combine anxiety,sleep and pain medications without talking with your primary care practitioner  Special Instructions: If you have smoked or chewed Tobacco  in the last 2 yrs please stop smoking, stop any regular Alcohol  and or any Recreational drug use.  Wear Seat belts while driving.  Please note: You were cared for by a hospitalist during your hospital stay. Once you are discharged, your primary care physician will handle any further medical issues. Please note that NO REFILLS for any discharge medications will be authorized once you are discharged, as it is imperative that you return to your primary care physician (or establish a relationship with a primary care physician if you do not have one) for your post hospital discharge needs so that they can reassess your need for medications and monitor your lab values.   Discharge instructions    Complete by:  As directed    Increase activity slowly    Complete by:  As directed        Medication List    STOP taking these medications   cyclobenzaprine 10 MG tablet Commonly known as:  FLEXERIL   dicyclomine 20 MG tablet Commonly known as:  BENTYL   FLUoxetine 10 MG capsule Commonly known as:  PROZAC   NEXPLANON 68 MG Impl implant Generic drug:  etonogestrel   oxyCODONE-acetaminophen 5-325 MG tablet Commonly known as:  PERCOCET/ROXICET   polyethylene glycol packet Commonly known as:  MIRALAX     TAKE these medications   acetaminophen 325 MG tablet Commonly known as:  TYLENOL Take 2 tablets (650 mg total) by mouth every 6 (six) hours as needed for moderate pain (headache).   ondansetron 4 MG disintegrating tablet Commonly known as:  ZOFRAN ODT Take 1 tablet (4 mg total) by mouth every 8 (eight) hours as needed for nausea or  vomiting. What changed:  Another medication with the same name was removed. Continue taking this medication, and follow the directions you see here.   pantoprazole 40 MG tablet Commonly known as:  PROTONIX Take 1 tablet (40 mg total) by mouth daily at 12 noon.      Follow-up Information    [facility]. Schedule an appointment as soon as possible for a visit in 1 week(s).   Why:  Please call on Monday-10/23 for a follow-up appointment. Please let them know that you were discharged from the hospital Contact information: [address] [place] [address] [phone]         Allergies  Allergen Reactions  . Tramadol Itching  . Latex Itching, Swelling and Rash    Reaction to condoms    Consultations:   GI  Other Procedures/Studies: Dg Chest 2 View  Result Date: 07/07/2016 CLINICAL DATA:  20 year old female with cough. EXAM: CHEST  2 VIEW COMPARISON:  Chest radiograph dated 05/17/2016 FINDINGS: The heart size and mediastinal contours are within normal limits. Both lungs are clear. The visualized skeletal structures are unremarkable. IMPRESSION: No active cardiopulmonary disease. Electronically Signed   By: Elgie Collard M.D.   On: 07/07/2016 04:42   Dg Lumbar Spine 2-3 Views  Result Date: 07/08/2016 CLINICAL DATA:  Fall, lower back pain EXAM: LUMBAR SPINE - 2-3 VIEW COMPARISON:  05/17/2016 FINDINGS: Three  views of the lumbar spine submitted. No acute fracture or subluxation. Alignment, disc spaces and vertebral body heights are preserved. IMPRESSION: Negative. Electronically Signed   By: Natasha Mead M.D.   On: 07/08/2016 09:08   Mr Laqueta Jean ZO Contrast  Result Date: 07/08/2016 CLINICAL DATA:  Blurred vision and headache. EXAM: MRI HEAD AND ORBITS WITHOUT AND WITH CONTRAST MR VENOGRAM HEAD WITHOUT CONTRAST TECHNIQUE: Multiplanar, multiecho pulse sequences of the brain and surrounding structures were obtained without and with  intravenous contrast. Multiplanar, multiecho pulse sequences of the orbits and surrounding structures were obtained including fat saturation techniques, before and after intravenous contrast administration. Angiographic images of the intracranial venous structures were obtained using MRV technique without intravenous contrast. CONTRAST:  20mL MULTIHANCE GADOBENATE DIMEGLUMINE 529 MG/ML IV SOLN COMPARISON:  Brain MRI 07/08/2015 FINDINGS: MRI HEAD FINDINGS Brain: There is no evidence of acute infarct, intracranial hemorrhage, mass, midline shift, or extra-axial fluid collection. The ventricles and sulci are normal. The brain is normal in signal. No abnormal enhancement is identified. Vascular: Major intracranial vascular flow voids are preserved. Skull and upper cervical spine: No focal osseous lesion. Other: None. MRI ORBITS FINDINGS Orbits: The globes appear intact within limitations of mild motion and susceptibility artifact. The optic nerves are symmetric and normal in appearance without evidence of abnormal enhancement. The extraocular muscles and lacrimal glands are unremarkable. No orbital mass or inflammatory change is identified. Optic chiasm is unremarkable. Visualized sinuses: Clear. Soft tissues: Negative. Limited intracranial: Unremarkable. MR VENOGRAM HEAD FINDINGS Superior sagittal sinus, internal cerebral veins, vein of Galen, straight sinus, transverse sinuses, sigmoid sinuses, and jugular bulbs appear patent without evidence of thrombosis. The right transverse and sigmoid sinuses are mildly dominant, and poor visualization of portions of the left transverse and left sigmoid sinuses is felt to be artifactual given their normal appearance on the concurrent conventional brain MRI. IMPRESSION: 1. Unremarkable appearance of the brain and orbits. 2. No evidence of dural venous sinus thrombosis. Electronically Signed   By: Sebastian Ache M.D.   On: 07/08/2016 17:21   Mr Mrv Head Wo Cm  Result Date:  07/08/2016 CLINICAL DATA:  Blurred vision and headache. EXAM: MRI HEAD AND ORBITS WITHOUT AND WITH CONTRAST MR VENOGRAM HEAD WITHOUT CONTRAST TECHNIQUE: Multiplanar, multiecho pulse sequences of the brain and surrounding structures were obtained without and with intravenous contrast. Multiplanar, multiecho pulse sequences of the orbits and surrounding structures were obtained including fat saturation techniques, before and after intravenous contrast administration. Angiographic images of the intracranial venous structures were obtained using MRV technique without intravenous contrast. CONTRAST:  20mL MULTIHANCE GADOBENATE DIMEGLUMINE 529 MG/ML IV SOLN COMPARISON:  Brain MRI 07/08/2015 FINDINGS: MRI HEAD FINDINGS Brain: There is no evidence of acute infarct, intracranial hemorrhage, mass, midline shift, or extra-axial fluid collection. The ventricles and sulci are normal. The brain is normal in signal. No abnormal enhancement is identified. Vascular: Major intracranial vascular flow voids are preserved. Skull and upper cervical spine: No focal osseous lesion. Other: None. MRI ORBITS FINDINGS Orbits: The globes appear intact within limitations of mild motion and susceptibility artifact. The optic nerves are symmetric and normal in appearance without evidence of abnormal enhancement. The extraocular muscles and lacrimal glands are unremarkable. No orbital mass or inflammatory change is identified. Optic chiasm is unremarkable. Visualized sinuses: Clear. Soft tissues: Negative. Limited intracranial: Unremarkable. MR VENOGRAM HEAD FINDINGS Superior sagittal sinus, internal cerebral veins, vein of Galen, straight sinus, transverse sinuses, sigmoid sinuses, and jugular bulbs appear patent without evidence of thrombosis. The right  transverse and sigmoid sinuses are mildly dominant, and poor visualization of portions of the left transverse and left sigmoid sinuses is felt to be artifactual given their normal appearance on  the concurrent conventional brain MRI. IMPRESSION: 1. Unremarkable appearance of the brain and orbits. 2. No evidence of dural venous sinus thrombosis. Electronically Signed   By: Sebastian Ache M.D.   On: 07/08/2016 17:21   Mr Rockwell [place] ZO Contrast  Result Date: 07/08/2016 CLINICAL DATA:  Blurred vision and headache. EXAM: MRI HEAD AND ORBITS WITHOUT AND WITH CONTRAST MR VENOGRAM HEAD WITHOUT CONTRAST TECHNIQUE: Multiplanar, multiecho pulse sequences of the brain and surrounding structures were obtained without and with intravenous contrast. Multiplanar, multiecho pulse sequences of the orbits and surrounding structures were obtained including fat saturation techniques, before and after intravenous contrast administration. Angiographic images of the intracranial venous structures were obtained using MRV technique without intravenous contrast. CONTRAST:  20mL MULTIHANCE GADOBENATE DIMEGLUMINE 529 MG/ML IV SOLN COMPARISON:  Brain MRI 07/08/2015 FINDINGS: MRI HEAD FINDINGS Brain: There is no evidence of acute infarct, intracranial hemorrhage, mass, midline shift, or extra-axial fluid collection. The ventricles and sulci are normal. The brain is normal in signal. No abnormal enhancement is identified. Vascular: Major intracranial vascular flow voids are preserved. Skull and upper cervical spine: No focal osseous lesion. Other: None. MRI ORBITS FINDINGS Orbits: The globes appear intact within limitations of mild motion and susceptibility artifact. The optic nerves are symmetric and normal in appearance without evidence of abnormal enhancement. The extraocular muscles and lacrimal glands are unremarkable. No orbital mass or inflammatory change is identified. Optic chiasm is unremarkable. Visualized sinuses: Clear. Soft tissues: Negative. Limited intracranial: Unremarkable. MR VENOGRAM HEAD FINDINGS Superior sagittal sinus, internal cerebral veins, vein of Galen, straight sinus, transverse sinuses, sigmoid sinuses, and  jugular bulbs appear patent without evidence of thrombosis. The right transverse and sigmoid sinuses are mildly dominant, and poor visualization of portions of the left transverse and left sigmoid sinuses is felt to be artifactual given their normal appearance on the concurrent conventional brain MRI. IMPRESSION: 1. Unremarkable appearance of the brain and orbits. 2. No evidence of dural venous sinus thrombosis. Electronically Signed   By: Sebastian Ache M.D.   On: 07/08/2016 17:21   Dg Fluoro Guide Lumbar Puncture  Result Date: 07/09/2016 CLINICAL DATA: Headache. EXAM: DIAGNOSTIC LUMBAR PUNCTURE UNDER FLUOROSCOPIC GUIDANCE FLUOROSCOPY TIME:  11 seconds. PROCEDURE: Informed consent was obtained from the patient prior to the procedure, including potential complications of headache, allergy, and pain. With the patient prone, the lower back was prepped with Betadine. 1% Lidocaine was used for local anesthesia. Lumbar puncture was performed at the L3-L4 level using a 6 inch 20 gauge needle with return of clear CSF with an opening pressure of 12 cm water. 8.0 ml of CSF were obtained for laboratory studies. The patient tolerated the procedure well and there were no apparent complications. IMPRESSION: Technically successful diagnostic lumbar puncture under fluoroscopy. Clear colorless fluid was obtained. Electronically Signed   By: Elsie Stain M.D.   On: 07/09/2016 11:11     TODAY-DAY OF DISCHARGE:  Subjective:   Aliany Laiche today has no headache,no chest abdominal pain,no new weakness tingling or numbness, feels much better wants to go home today.  Objective:   Blood pressure (!) 101/52, pulse 78, temperature 98 F (36.7 C), temperature source Oral, resp. rate 18, height 5' 5.5" (1.664 m), weight (!) 137.1 kg (302 lb 4.4 oz), SpO2 100 %.  Intake/Output Summary (Last 24 hours) at 07/09/16  1644 Last data filed at 07/09/16 1423  Gross per 24 hour  Intake              840 ml  Output                0  ml  Net              840 ml   Filed Weights   07/07/16 0836 07/07/16 2129 07/08/16 2120  Weight: (!) 137.7 kg (303 lb 9.6 oz) (!) 137.8 kg (303 lb 12.7 oz) (!) 137.1 kg (302 lb 4.4 oz)    Exam: Awake Alert, Oriented *3, No new F.N deficits, Normal affect .AT,PERRAL Supple Neck,No JVD, No cervical lymphadenopathy appriciated.  Symmetrical Chest wall movement, Good air movement bilaterally, CTAB RRR,No Gallops,Rubs or new Murmurs, No Parasternal Heave +ve B.Sounds, Abd Soft, Non tender, No organomegaly appriciated, No rebound -guarding or rigidity. No Cyanosis, Clubbing or edema, No new Rash or bruise   PERTINENT RADIOLOGIC STUDIES: Dg Chest 2 View  Result Date: 07/07/2016 CLINICAL DATA:  20 year old female with cough. EXAM: CHEST  2 VIEW COMPARISON:  Chest radiograph dated 05/17/2016 FINDINGS: The heart size and mediastinal contours are within normal limits. Both lungs are clear. The visualized skeletal structures are unremarkable. IMPRESSION: No active cardiopulmonary disease. Electronically Signed   By: Elgie Collard M.D.   On: 07/07/2016 04:42   Dg Lumbar Spine 2-3 Views  Result Date: 07/08/2016 CLINICAL DATA:  Fall, lower back pain EXAM: LUMBAR SPINE - 2-3 VIEW COMPARISON:  05/17/2016 FINDINGS: Three views of the lumbar spine submitted. No acute fracture or subluxation. Alignment, disc spaces and vertebral body heights are preserved. IMPRESSION: Negative. Electronically Signed   By: Natasha Mead M.D.   On: 07/08/2016 09:08   Mr Laqueta Jean WU Contrast  Result Date: 07/08/2016 CLINICAL DATA:  Blurred vision and headache. EXAM: MRI HEAD AND ORBITS WITHOUT AND WITH CONTRAST MR VENOGRAM HEAD WITHOUT CONTRAST TECHNIQUE: Multiplanar, multiecho pulse sequences of the brain and surrounding structures were obtained without and with intravenous contrast. Multiplanar, multiecho pulse sequences of the orbits and surrounding structures were obtained including fat saturation techniques, before  and after intravenous contrast administration. Angiographic images of the intracranial venous structures were obtained using MRV technique without intravenous contrast. CONTRAST:  20mL MULTIHANCE GADOBENATE DIMEGLUMINE 529 MG/ML IV SOLN COMPARISON:  Brain MRI 07/08/2015 FINDINGS: MRI HEAD FINDINGS Brain: There is no evidence of acute infarct, intracranial hemorrhage, mass, midline shift, or extra-axial fluid collection. The ventricles and sulci are normal. The brain is normal in signal. No abnormal enhancement is identified. Vascular: Major intracranial vascular flow voids are preserved. Skull and upper cervical spine: No focal osseous lesion. Other: None. MRI ORBITS FINDINGS Orbits: The globes appear intact within limitations of mild motion and susceptibility artifact. The optic nerves are symmetric and normal in appearance without evidence of abnormal enhancement. The extraocular muscles and lacrimal glands are unremarkable. No orbital mass or inflammatory change is identified. Optic chiasm is unremarkable. Visualized sinuses: Clear. Soft tissues: Negative. Limited intracranial: Unremarkable. MR VENOGRAM HEAD FINDINGS Superior sagittal sinus, internal cerebral veins, vein of Galen, straight sinus, transverse sinuses, sigmoid sinuses, and jugular bulbs appear patent without evidence of thrombosis. The right transverse and sigmoid sinuses are mildly dominant, and poor visualization of portions of the left transverse and left sigmoid sinuses is felt to be artifactual given their normal appearance on the concurrent conventional brain MRI. IMPRESSION: 1. Unremarkable appearance of the brain and orbits. 2. No evidence of dural venous  sinus thrombosis. Electronically Signed   By: Sebastian AcheAllen  Grady M.D.   On: 07/08/2016 17:21   Mr Mrv Head Wo Cm  Result Date: 07/08/2016 CLINICAL DATA:  Blurred vision and headache. EXAM: MRI HEAD AND ORBITS WITHOUT AND WITH CONTRAST MR VENOGRAM HEAD WITHOUT CONTRAST TECHNIQUE: Multiplanar,  multiecho pulse sequences of the brain and surrounding structures were obtained without and with intravenous contrast. Multiplanar, multiecho pulse sequences of the orbits and surrounding structures were obtained including fat saturation techniques, before and after intravenous contrast administration. Angiographic images of the intracranial venous structures were obtained using MRV technique without intravenous contrast. CONTRAST:  20mL MULTIHANCE GADOBENATE DIMEGLUMINE 529 MG/ML IV SOLN COMPARISON:  Brain MRI 07/08/2015 FINDINGS: MRI HEAD FINDINGS Brain: There is no evidence of acute infarct, intracranial hemorrhage, mass, midline shift, or extra-axial fluid collection. The ventricles and sulci are normal. The brain is normal in signal. No abnormal enhancement is identified. Vascular: Major intracranial vascular flow voids are preserved. Skull and upper cervical spine: No focal osseous lesion. Other: None. MRI ORBITS FINDINGS Orbits: The globes appear intact within limitations of mild motion and susceptibility artifact. The optic nerves are symmetric and normal in appearance without evidence of abnormal enhancement. The extraocular muscles and lacrimal glands are unremarkable. No orbital mass or inflammatory change is identified. Optic chiasm is unremarkable. Visualized sinuses: Clear. Soft tissues: Negative. Limited intracranial: Unremarkable. MR VENOGRAM HEAD FINDINGS Superior sagittal sinus, internal cerebral veins, vein of Galen, straight sinus, transverse sinuses, sigmoid sinuses, and jugular bulbs appear patent without evidence of thrombosis. The right transverse and sigmoid sinuses are mildly dominant, and poor visualization of portions of the left transverse and left sigmoid sinuses is felt to be artifactual given their normal appearance on the concurrent conventional brain MRI. IMPRESSION: 1. Unremarkable appearance of the brain and orbits. 2. No evidence of dural venous sinus thrombosis. Electronically  Signed   By: Sebastian AcheAllen  Grady M.D.   On: 07/08/2016 17:21   Mr Rockwell GermanyOrbits W ZOWo Contrast  Result Date: 07/08/2016 CLINICAL DATA:  Blurred vision and headache. EXAM: MRI HEAD AND ORBITS WITHOUT AND WITH CONTRAST MR VENOGRAM HEAD WITHOUT CONTRAST TECHNIQUE: Multiplanar, multiecho pulse sequences of the brain and surrounding structures were obtained without and with intravenous contrast. Multiplanar, multiecho pulse sequences of the orbits and surrounding structures were obtained including fat saturation techniques, before and after intravenous contrast administration. Angiographic images of the intracranial venous structures were obtained using MRV technique without intravenous contrast. CONTRAST:  20mL MULTIHANCE GADOBENATE DIMEGLUMINE 529 MG/ML IV SOLN COMPARISON:  Brain MRI 07/08/2015 FINDINGS: MRI HEAD FINDINGS Brain: There is no evidence of acute infarct, intracranial hemorrhage, mass, midline shift, or extra-axial fluid collection. The ventricles and sulci are normal. The brain is normal in signal. No abnormal enhancement is identified. Vascular: Major intracranial vascular flow voids are preserved. Skull and upper cervical spine: No focal osseous lesion. Other: None. MRI ORBITS FINDINGS Orbits: The globes appear intact within limitations of mild motion and susceptibility artifact. The optic nerves are symmetric and normal in appearance without evidence of abnormal enhancement. The extraocular muscles and lacrimal glands are unremarkable. No orbital mass or inflammatory change is identified. Optic chiasm is unremarkable. Visualized sinuses: Clear. Soft tissues: Negative. Limited intracranial: Unremarkable. MR VENOGRAM HEAD FINDINGS Superior sagittal sinus, internal cerebral veins, vein of Galen, straight sinus, transverse sinuses, sigmoid sinuses, and jugular bulbs appear patent without evidence of thrombosis. The right transverse and sigmoid sinuses are mildly dominant, and poor visualization of portions of the  left transverse and left sigmoid  sinuses is felt to be artifactual given their normal appearance on the concurrent conventional brain MRI. IMPRESSION: 1. Unremarkable appearance of the brain and orbits. 2. No evidence of dural venous sinus thrombosis. Electronically Signed   By: Sebastian Ache M.D.   On: 07/08/2016 17:21   Dg Fluoro Guide Lumbar Puncture  Result Date: 07/09/2016 CLINICAL DATA: Headache. EXAM: DIAGNOSTIC LUMBAR PUNCTURE UNDER FLUOROSCOPIC GUIDANCE FLUOROSCOPY TIME:  11 seconds. PROCEDURE: Informed consent was obtained from the patient prior to the procedure, including potential complications of headache, allergy, and pain. With the patient prone, the lower back was prepped with Betadine. 1% Lidocaine was used for local anesthesia. Lumbar puncture was performed at the L3-L4 level using a 6 inch 20 gauge needle with return of clear CSF with an opening pressure of 12 cm water. 8.0 ml of CSF were obtained for laboratory studies. The patient tolerated the procedure well and there were no apparent complications. IMPRESSION: Technically successful diagnostic lumbar puncture under fluoroscopy. Clear colorless fluid was obtained. Electronically Signed   By: Elsie Stain M.D.   On: 07/09/2016 11:11     PERTINENT LAB RESULTS: CBC:  Recent Labs  07/08/16 0055 07/08/16 0730 07/09/16 0551  WBC 10.0  --  8.4  HGB 12.0 11.3* 12.0  HCT 38.3 36.3 37.6  PLT 316  --  288   CMET CMP     Component Value Date/Time   NA 137 07/09/2016 0551   K 3.7 07/09/2016 0551   CL 104 07/09/2016 0551   CO2 26 07/09/2016 0551   GLUCOSE 81 07/09/2016 0551   BUN 7 07/09/2016 0551   CREATININE 0.78 07/09/2016 0551   CREATININE 0.60 04/07/2015 1625   CALCIUM 8.9 07/09/2016 0551   PROT 5.9 (L) 07/08/2016 0055   ALBUMIN 3.0 (L) 07/08/2016 0055   AST 20 07/08/2016 0055   ALT 20 07/08/2016 0055   ALKPHOS 83 07/08/2016 0055   BILITOT 0.8 07/08/2016 0055   GFRNONAA >60 07/09/2016 0551   GFRAA >60 07/09/2016  0551    GFR Estimated Creatinine Clearance: 157.7 mL/min (by C-G formula based on SCr of 0.78 mg/dL).  Recent Labs  07/07/16 0227  LIPASE 36   No results for input(s): CKTOTAL, CKMB, CKMBINDEX, TROPONINI in the last 72 hours. Invalid input(s): POCBNP No results for input(s): DDIMER in the last 72 hours.  Recent Labs  07/07/16 0736  HGBA1C 5.7*   No results for input(s): CHOL, HDL, LDLCALC, TRIG, CHOLHDL, LDLDIRECT in the last 72 hours. No results for input(s): TSH, T4TOTAL, T3FREE, THYROIDAB in the last 72 hours.  Invalid input(s): FREET3 No results for input(s): VITAMINB12, FOLATE, FERRITIN, TIBC, IRON, RETICCTPCT in the last 72 hours. Coags:  Recent Labs  07/07/16 0715  INR 0.95   Microbiology: No results found for this or any previous visit (from the past 240 hour(s)).  FURTHER DISCHARGE INSTRUCTIONS:  Get Medicines reviewed and adjusted: Please take all your medications with you for your next visit with your Primary MD  Laboratory/radiological data: Please request your Primary MD to go over all hospital tests and procedure/radiological results at the follow up, please ask your Primary MD to get all Hospital records sent to his/her office.  In some cases, they will be blood work, cultures and biopsy results pending at the time of your discharge. Please request that your primary care M.D. goes through all the records of your hospital data and follows up on these results.  Also Note the following: If you experience worsening of your admission symptoms,  develop shortness of breath, life threatening emergency, suicidal or homicidal thoughts you must seek medical attention immediately by calling 911 or calling your MD immediately  if symptoms less severe.  You must read complete instructions/literature along with all the possible adverse reactions/side effects for all the Medicines you take and that have been prescribed to you. Take any new Medicines after you have  completely understood and accpet all the possible adverse reactions/side effects.   Do not drive when taking Pain medications or sleeping medications (Benzodaizepines)  Do not take more than prescribed Pain, Sleep and Anxiety Medications. It is not advisable to combine anxiety,sleep and pain medications without talking with your primary care practitioner  Special Instructions: If you have smoked or chewed Tobacco  in the last 2 yrs please stop smoking, stop any regular Alcohol  and or any Recreational drug use.  Wear Seat belts while driving.  Please note: You were cared for by a hospitalist during your hospital stay. Once you are discharged, your primary care physician will handle any further medical issues. Please note that NO REFILLS for any discharge medications will be authorized once you are discharged, as it is imperative that you return to your primary care physician (or establish a relationship with a primary care physician if you do not have one) for your post hospital discharge needs so that they can reassess your need for medications and monitor your lab values.  Total Time spent coordinating discharge including counseling, education and face to face time equals 25 minutes.  SignedJeoffrey Massed 07/09/2016 4:44 PM

## 2016-07-08 NOTE — Progress Notes (Signed)
Informed by RN-that after eating/drinking-patient started having gen headaches and blurry vision. Non focal exam-denies migraine headaches in the past. Claims that she only started having intermittent headaches after she was admitted-when I saw her this am-she was sleeping and woke up-she did not have any headaches. Hold d/c- CT head-antiemetics for now.

## 2016-07-09 ENCOUNTER — Encounter (HOSPITAL_COMMUNITY): Payer: Self-pay | Admitting: Gastroenterology

## 2016-07-09 ENCOUNTER — Inpatient Hospital Stay (HOSPITAL_COMMUNITY): Payer: Self-pay

## 2016-07-09 DIAGNOSIS — R51 Headache: Secondary | ICD-10-CM

## 2016-07-09 DIAGNOSIS — R519 Headache, unspecified: Secondary | ICD-10-CM

## 2016-07-09 DIAGNOSIS — G4489 Other headache syndrome: Secondary | ICD-10-CM

## 2016-07-09 LAB — BASIC METABOLIC PANEL
ANION GAP: 7 (ref 5–15)
BUN: 7 mg/dL (ref 6–20)
CHLORIDE: 104 mmol/L (ref 101–111)
CO2: 26 mmol/L (ref 22–32)
Calcium: 8.9 mg/dL (ref 8.9–10.3)
Creatinine, Ser: 0.78 mg/dL (ref 0.44–1.00)
GFR calc Af Amer: >60 mL/min (ref >60)
GLUCOSE: 81 mg/dL (ref 65–99)
POTASSIUM: 3.7 mmol/L (ref 3.5–5.1)
Sodium: 137 mmol/L (ref 135–145)

## 2016-07-09 LAB — CBC
HEMATOCRIT: 37.6 % (ref 36.0–46.0)
HEMOGLOBIN: 12 g/dL (ref 12.0–15.0)
MCH: 23.5 pg — AB (ref 26.0–34.0)
MCHC: 31.9 g/dL (ref 30.0–36.0)
MCV: 73.6 fL — AB (ref 78.0–100.0)
PLATELETS: 288 10*3/uL (ref 150–400)
RBC: 5.11 MIL/uL (ref 3.87–5.11)
RDW: 14.4 % (ref 11.5–15.5)
WBC: 8.4 10*3/uL (ref 4.0–10.5)

## 2016-07-09 LAB — PROTEIN, CSF: Total  Protein, CSF: 15 mg/dL (ref 15–45)

## 2016-07-09 LAB — CSF CELL COUNT WITH DIFFERENTIAL
RBC COUNT CSF: 0 /mm3
TUBE #: 3
WBC, CSF: 4 /mm3 (ref 0–5)

## 2016-07-09 LAB — GLUCOSE, CSF: Glucose, CSF: 58 mg/dL (ref 40–70)

## 2016-07-09 MED ORDER — PANTOPRAZOLE SODIUM 40 MG PO TBEC: 40.0000 mg | DELAYED_RELEASE_TABLET | Freq: Every day | ORAL | 0 refills | Status: DC

## 2016-07-09 MED ORDER — VALPROATE SODIUM 500 MG/5ML IV SOLN
1000.0000 mg | Freq: Once | INTRAVENOUS | Status: AC
  Administered 2016-07-09: 1000 mg via INTRAVENOUS
  Filled 2016-07-09: qty 10

## 2016-07-09 MED ORDER — ACETAMINOPHEN 325 MG PO TABS: 650.0000 mg | ORAL_TABLET | Freq: Four times a day (QID) | ORAL | Status: DC | PRN

## 2016-07-09 MED ORDER — METOCLOPRAMIDE HCL 5 MG/ML IJ SOLN
10.0000 mg | Freq: Once | INTRAMUSCULAR | Status: AC
  Administered 2016-07-09: 10 mg via INTRAVENOUS
  Filled 2016-07-09: qty 2

## 2016-07-09 MED ORDER — DIPHENHYDRAMINE HCL 50 MG/ML IJ SOLN
25.0000 mg | Freq: Once | INTRAMUSCULAR | Status: AC
  Administered 2016-07-09: 25 mg via INTRAVENOUS
  Filled 2016-07-09: qty 1

## 2016-07-09 MED ORDER — LIDOCAINE HCL 1 % IJ SOLN
INTRAMUSCULAR | Status: AC
  Filled 2016-07-09: qty 10

## 2016-07-09 NOTE — Progress Notes (Addendum)
PROGRESS NOTE        PATIENT DETAILS Name: Ashlee Thompson Age: 20 y.o. Sex: female Date of Birth: Apr 08, 1996 Admit Date: 07/07/2016 Admitting Physician Pete Glatter, MD PCP:No PCP Per Patient  Brief Narrative: Patient is a 20 y.o. female with morbid obesity-admitted with headache and vomiting. Had one episode of hematemesis. Underwent endoscopy without any major findings. Hospital course complicated by worsening headaches and blurry vision. Workup in progress.  Subjective: Has some frontal headache and mild photophobia this morning.  Assessment/Plan: Headache/blurry vision: Neurology following, MRI/MRV brain negative for acute abnormalities. Lumbar puncture with opening pressure not elevated-doubt benign intracranial hypertension at this time. Suspect migrainous headache- trial of IV Depakote along with Phenergan and Benadryl. Await further recommendations from neurology prior to discharge home.   Transient hematemesis/vomiting: Resolved. Endoscopy negative. Continue PPI  Morbid obesity: Counseled  DVT Prophylaxis: Prophylactic Lovenox   Code Status: Full code   Family Communication: None at bedside  Disposition Plan: Remain inpatient-home later today or in am   Antimicrobial agents: None  Procedures: LP 10/23  CONSULTS:  GI and neurology  Time spent: 25 minutes-Greater than 50% of this time was spent in counseling, explanation of diagnosis, planning of further management, and coordination of care.  MEDICATIONS: Anti-infectives    None      Scheduled Meds: . metoCLOPramide (REGLAN) injection  10 mg Intravenous Once   And  . diphenhydrAMINE  25 mg Intravenous Once  . lidocaine      . pantoprazole  40 mg Oral Q1200  . sodium chloride flush  3 mL Intravenous Q12H  . valproate sodium  1,000 mg Intravenous Once   Continuous Infusions:  PRN Meds:.acetaminophen, ondansetron (ZOFRAN) IV   PHYSICAL EXAM: Vital signs: Vitals:   07/08/16 0924 07/08/16 1810 07/08/16 2120 07/09/16 1028  BP: (!) 97/58 101/67 111/61 (!) 101/52  Pulse: 78 99 86 78  Resp: 17 18 18 18   Temp: 98.3 F (36.8 C) 98.8 F (37.1 C) 98.7 F (37.1 C) 98 F (36.7 C)  TempSrc: Oral Oral Oral Oral  SpO2: 98% 95% 98% 100%  Weight:   (!) 137.1 kg (302 lb 4.4 oz)   Height:       Filed Weights   07/07/16 0836 07/07/16 2129 07/08/16 2120  Weight: (!) 137.7 kg (303 lb 9.6 oz) (!) 137.8 kg (303 lb 12.7 oz) (!) 137.1 kg (302 lb 4.4 oz)   Body mass index is 49.54 kg/m.   General appearance :Awake, alert, not in any distress. Speech Clear. Not toxic Looking Eyes:, pupils equally reactive to light and accomodation,no scleral icterus.Pink conjunctiva HEENT: Atraumatic and Normocephalic Neck: supple, no JVD. No cervical lymphadenopathy. No thyromegaly Resp:Good air entry bilaterally, no added sounds  CVS: S1 S2 regular, no murmurs.  GI: Bowel sounds present, Non tender and not distended with no gaurding, rigidity or rebound.No organomegaly Extremities: B/L Lower Ext shows no edema, both legs are warm to touch Neurology:  speech clear,Non focal, sensation is grossly intact. Psychiatric: Normal judgment and insight. Alert and oriented x 3. Normal mood. Musculoskeletal:gait appears to be normal.No digital cyanosis Skin:No Rash, warm and dry Wounds:N/A  I have personally reviewed following labs and imaging studies  LABORATORY DATA: CBC:  Recent Labs Lab 07/07/16 0227 07/07/16 0520  07/07/16 1404 07/07/16 1921 07/08/16 0055 07/08/16 0730 07/09/16 0551  WBC 12.2* 8.9  --   --   --  10.0  --  8.4  HGB 13.4 11.6*  < > 12.1 12.2 12.0 11.3* 12.0  HCT 41.5 36.9  < > 38.4 38.1 38.3 36.3 37.6  MCV 73.8* 73.8*  --   --   --  73.7*  --  73.6*  PLT 332 289  --   --   --  316  --  288  < > = values in this interval not displayed.  Basic Metabolic Panel:  Recent Labs Lab 07/07/16 0227 07/08/16 0055 07/09/16 0551  NA 135 137 137  K 4.1 3.7 3.7    CL 105 107 104  CO2 23 24 26   GLUCOSE 93 79 81  BUN 13 8 7   CREATININE 0.64 0.66 0.78  CALCIUM 9.1 8.9 8.9    GFR: Estimated Creatinine Clearance: 157.7 mL/min (by C-G formula based on SCr of 0.78 mg/dL).  Liver Function Tests:  Recent Labs Lab 07/07/16 0227 07/08/16 0055  AST 21 20  ALT 23 20  ALKPHOS 96 83  BILITOT 0.2* 0.8  PROT 6.7 5.9*  ALBUMIN 3.4* 3.0*    Recent Labs Lab 07/07/16 0227  LIPASE 36   No results for input(s): AMMONIA in the last 168 hours.  Coagulation Profile:  Recent Labs Lab 07/07/16 0715  INR 0.95    Cardiac Enzymes: No results for input(s): CKTOTAL, CKMB, CKMBINDEX, TROPONINI in the last 168 hours.  BNP (last 3 results) No results for input(s): PROBNP in the last 8760 hours.  HbA1C:  Recent Labs  07/07/16 0736  HGBA1C 5.7*    CBG:  Recent Labs Lab 07/07/16 2140 07/08/16 0014 07/08/16 0357  GLUCAP 80 74 74    Lipid Profile: No results for input(s): CHOL, HDL, LDLCALC, TRIG, CHOLHDL, LDLDIRECT in the last 72 hours.  Thyroid Function Tests: No results for input(s): TSH, T4TOTAL, FREET4, T3FREE, THYROIDAB in the last 72 hours.  Anemia Panel: No results for input(s): VITAMINB12, FOLATE, FERRITIN, TIBC, IRON, RETICCTPCT in the last 72 hours.  Urine analysis:    Component Value Date/Time   COLORURINE YELLOW 07/07/2016 0345   APPEARANCEUR CLEAR 07/07/2016 0345   LABSPEC 1.021 07/07/2016 0345   PHURINE 7.5 07/07/2016 0345   GLUCOSEU NEGATIVE 07/07/2016 0345   HGBUR NEGATIVE 07/07/2016 0345   BILIRUBINUR NEGATIVE 07/07/2016 0345   BILIRUBINUR neg 04/07/2015 1640   KETONESUR NEGATIVE 07/07/2016 0345   PROTEINUR NEGATIVE 07/07/2016 0345   UROBILINOGEN 0.2 11/10/2015 1729   NITRITE NEGATIVE 07/07/2016 0345   LEUKOCYTESUR NEGATIVE 07/07/2016 0345    Sepsis Labs: Lactic Acid, Venous    Component Value Date/Time   LATICACIDVEN 1.43 07/07/2016 0310    MICROBIOLOGY: No results found for this or any previous  visit (from the past 240 hour(s)).  RADIOLOGY STUDIES/RESULTS: Dg Chest 2 View  Result Date: 07/07/2016 CLINICAL DATA:  20 year old female with cough. EXAM: CHEST  2 VIEW COMPARISON:  Chest radiograph dated 05/17/2016 FINDINGS: The heart size and mediastinal contours are within normal limits. Both lungs are clear. The visualized skeletal structures are unremarkable. IMPRESSION: No active cardiopulmonary disease. Electronically Signed   By: Elgie Collard M.D.   On: 07/07/2016 04:42   Dg Lumbar Spine 2-3 Views  Result Date: 07/08/2016 CLINICAL DATA:  Fall, lower back pain EXAM: LUMBAR SPINE - 2-3 VIEW COMPARISON:  05/17/2016 FINDINGS: Three views of the lumbar spine submitted. No acute fracture or subluxation. Alignment, disc spaces and vertebral body heights are preserved. IMPRESSION: Negative. Electronically Signed   By: Natasha Mead M.D.   On: 07/08/2016 09:08  Mr Laqueta JeanBrain W Wo Contrast  Result Date: 07/08/2016 CLINICAL DATA:  Blurred vision and headache. EXAM: MRI HEAD AND ORBITS WITHOUT AND WITH CONTRAST MR VENOGRAM HEAD WITHOUT CONTRAST TECHNIQUE: Multiplanar, multiecho pulse sequences of the brain and surrounding structures were obtained without and with intravenous contrast. Multiplanar, multiecho pulse sequences of the orbits and surrounding structures were obtained including fat saturation techniques, before and after intravenous contrast administration. Angiographic images of the intracranial venous structures were obtained using MRV technique without intravenous contrast. CONTRAST:  20mL MULTIHANCE GADOBENATE DIMEGLUMINE 529 MG/ML IV SOLN COMPARISON:  Brain MRI 07/08/2015 FINDINGS: MRI HEAD FINDINGS Brain: There is no evidence of acute infarct, intracranial hemorrhage, mass, midline shift, or extra-axial fluid collection. The ventricles and sulci are normal. The brain is normal in signal. No abnormal enhancement is identified. Vascular: Major intracranial vascular flow voids are preserved.  Skull and upper cervical spine: No focal osseous lesion. Other: None. MRI ORBITS FINDINGS Orbits: The globes appear intact within limitations of mild motion and susceptibility artifact. The optic nerves are symmetric and normal in appearance without evidence of abnormal enhancement. The extraocular muscles and lacrimal glands are unremarkable. No orbital mass or inflammatory change is identified. Optic chiasm is unremarkable. Visualized sinuses: Clear. Soft tissues: Negative. Limited intracranial: Unremarkable. MR VENOGRAM HEAD FINDINGS Superior sagittal sinus, internal cerebral veins, vein of Galen, straight sinus, transverse sinuses, sigmoid sinuses, and jugular bulbs appear patent without evidence of thrombosis. The right transverse and sigmoid sinuses are mildly dominant, and poor visualization of portions of the left transverse and left sigmoid sinuses is felt to be artifactual given their normal appearance on the concurrent conventional brain MRI. IMPRESSION: 1. Unremarkable appearance of the brain and orbits. 2. No evidence of dural venous sinus thrombosis. Electronically Signed   By: Sebastian AcheAllen  Grady M.D.   On: 07/08/2016 17:21   Mr Mrv Head Wo Cm  Result Date: 07/08/2016 CLINICAL DATA:  Blurred vision and headache. EXAM: MRI HEAD AND ORBITS WITHOUT AND WITH CONTRAST MR VENOGRAM HEAD WITHOUT CONTRAST TECHNIQUE: Multiplanar, multiecho pulse sequences of the brain and surrounding structures were obtained without and with intravenous contrast. Multiplanar, multiecho pulse sequences of the orbits and surrounding structures were obtained including fat saturation techniques, before and after intravenous contrast administration. Angiographic images of the intracranial venous structures were obtained using MRV technique without intravenous contrast. CONTRAST:  20mL MULTIHANCE GADOBENATE DIMEGLUMINE 529 MG/ML IV SOLN COMPARISON:  Brain MRI 07/08/2015 FINDINGS: MRI HEAD FINDINGS Brain: There is no evidence of acute  infarct, intracranial hemorrhage, mass, midline shift, or extra-axial fluid collection. The ventricles and sulci are normal. The brain is normal in signal. No abnormal enhancement is identified. Vascular: Major intracranial vascular flow voids are preserved. Skull and upper cervical spine: No focal osseous lesion. Other: None. MRI ORBITS FINDINGS Orbits: The globes appear intact within limitations of mild motion and susceptibility artifact. The optic nerves are symmetric and normal in appearance without evidence of abnormal enhancement. The extraocular muscles and lacrimal glands are unremarkable. No orbital mass or inflammatory change is identified. Optic chiasm is unremarkable. Visualized sinuses: Clear. Soft tissues: Negative. Limited intracranial: Unremarkable. MR VENOGRAM HEAD FINDINGS Superior sagittal sinus, internal cerebral veins, vein of Galen, straight sinus, transverse sinuses, sigmoid sinuses, and jugular bulbs appear patent without evidence of thrombosis. The right transverse and sigmoid sinuses are mildly dominant, and poor visualization of portions of the left transverse and left sigmoid sinuses is felt to be artifactual given their normal appearance on the concurrent conventional brain MRI. IMPRESSION: 1. Unremarkable  appearance of the brain and orbits. 2. No evidence of dural venous sinus thrombosis. Electronically Signed   By: Sebastian Ache M.D.   On: 07/08/2016 17:21   Mr Rockwell [place] ZO Contrast  Result Date: 07/08/2016 CLINICAL DATA:  Blurred vision and headache. EXAM: MRI HEAD AND ORBITS WITHOUT AND WITH CONTRAST MR VENOGRAM HEAD WITHOUT CONTRAST TECHNIQUE: Multiplanar, multiecho pulse sequences of the brain and surrounding structures were obtained without and with intravenous contrast. Multiplanar, multiecho pulse sequences of the orbits and surrounding structures were obtained including fat saturation techniques, before and after intravenous contrast administration. Angiographic images of  the intracranial venous structures were obtained using MRV technique without intravenous contrast. CONTRAST:  20mL MULTIHANCE GADOBENATE DIMEGLUMINE 529 MG/ML IV SOLN COMPARISON:  Brain MRI 07/08/2015 FINDINGS: MRI HEAD FINDINGS Brain: There is no evidence of acute infarct, intracranial hemorrhage, mass, midline shift, or extra-axial fluid collection. The ventricles and sulci are normal. The brain is normal in signal. No abnormal enhancement is identified. Vascular: Major intracranial vascular flow voids are preserved. Skull and upper cervical spine: No focal osseous lesion. Other: None. MRI ORBITS FINDINGS Orbits: The globes appear intact within limitations of mild motion and susceptibility artifact. The optic nerves are symmetric and normal in appearance without evidence of abnormal enhancement. The extraocular muscles and lacrimal glands are unremarkable. No orbital mass or inflammatory change is identified. Optic chiasm is unremarkable. Visualized sinuses: Clear. Soft tissues: Negative. Limited intracranial: Unremarkable. MR VENOGRAM HEAD FINDINGS Superior sagittal sinus, internal cerebral veins, vein of Galen, straight sinus, transverse sinuses, sigmoid sinuses, and jugular bulbs appear patent without evidence of thrombosis. The right transverse and sigmoid sinuses are mildly dominant, and poor visualization of portions of the left transverse and left sigmoid sinuses is felt to be artifactual given their normal appearance on the concurrent conventional brain MRI. IMPRESSION: 1. Unremarkable appearance of the brain and orbits. 2. No evidence of dural venous sinus thrombosis. Electronically Signed   By: Sebastian Ache M.D.   On: 07/08/2016 17:21   Dg Fluoro Guide Lumbar Puncture  Result Date: 07/09/2016 CLINICAL DATA: Headache. EXAM: DIAGNOSTIC LUMBAR PUNCTURE UNDER FLUOROSCOPIC GUIDANCE FLUOROSCOPY TIME:  11 seconds. PROCEDURE: Informed consent was obtained from the patient prior to the procedure, including  potential complications of headache, allergy, and pain. With the patient prone, the lower back was prepped with Betadine. 1% Lidocaine was used for local anesthesia. Lumbar puncture was performed at the L3-L4 level using a 6 inch 20 gauge needle with return of clear CSF with an opening pressure of 12 cm water. 8.0 ml of CSF were obtained for laboratory studies. The patient tolerated the procedure well and there were no apparent complications. IMPRESSION: Technically successful diagnostic lumbar puncture under fluoroscopy. Clear colorless fluid was obtained. Electronically Signed   By: Elsie Stain M.D.   On: 07/09/2016 11:11     LOS: 2 days   Jeoffrey Massed, MD  [facility] Pager:[phone]  If 7PM-7AM, please contact night-coverage [email or web address] Password [email or web address] 07/09/2016, 1:58 PM

## 2016-07-09 NOTE — Progress Notes (Signed)
Carlos Landeck to be D/C'd Home per MD order.  Discussed prescriptions and follow up appointments with the patient. Prescriptions given to patient, medication list explained in detail. Pt verbalized understanding.    Medication List    STOP taking these medications   cyclobenzaprine 10 MG tablet Commonly known as:  FLEXERIL   dicyclomine 20 MG tablet Commonly known as:  BENTYL   FLUoxetine 10 MG capsule Commonly known as:  PROZAC   NEXPLANON 68 MG Impl implant Generic drug:  etonogestrel   oxyCODONE-acetaminophen 5-325 MG tablet Commonly known as:  PERCOCET/ROXICET   polyethylene glycol packet Commonly known as:  MIRALAX     TAKE these medications   acetaminophen 325 MG tablet Commonly known as:  TYLENOL Take 2 tablets (650 mg total) by mouth every 6 (six) hours as needed for moderate pain (headache).   ondansetron 4 MG disintegrating tablet Commonly known as:  ZOFRAN ODT Take 1 tablet (4 mg total) by mouth every 8 (eight) hours as needed for nausea or vomiting. What changed:  Another medication with the same name was removed. Continue taking this medication, and follow the directions you see here.   pantoprazole 40 MG tablet Commonly known as:  PROTONIX Take 1 tablet (40 mg total) by mouth daily at 12 noon.       Vitals:   07/09/16 1028 07/09/16 1645  BP: (!) 101/52 (!) 90/58  Pulse: 78 99  Resp: 18 16  Temp: 98 F (36.7 C) 98.4 F (36.9 C)    Skin clean, dry and intact without evidence of skin break down, no evidence of skin tears noted. IV catheter discontinued intact. Site without signs and symptoms of complications. Dressing and pressure applied. Pt denies pain at this time. No complaints noted.  An After Visit Summary was printed and given to the patient. Patient escorted via WC, and D/C home via private auto.  Mariann BarterKellie Dara Beidleman BSN, RN [facility] Phone [phone]

## 2016-07-09 NOTE — Progress Notes (Addendum)
Subjective: Patient continues to have a headache. Headache remains in the frontal region mostly located over her left eye. She states that her vision is intermittently blurry but seems to be improving. She rates her headache 6/10. Patient currently is being wheeled down to fluoroscopy for LP  Exam: Vitals:   07/08/16 1810 07/08/16 2120  BP: 101/67 111/61  Pulse: 99 86  Resp: 18 18  Temp: 98.8 F (37.1 C) 98.7 F (37.1 C)       Gen: In bed, NAD MS: Alert and oriented, speech clear, follows all commands CN: 2 through 12 intact Motor: Moving all extremities well Sensory: Grossly intact   Pertinent Labs/Diagnostics: LP under fluoroscopy pending. Opening pressure pending    Impression:  This is a pleasant 20 year old female who has had a history of on and off headaches. Today she noted after the Protonix was stopped in the IV was taken out her headache came back significantly. Headache is located over the frontal region of her head and over the left eye. As a pounding quality. Patient does admit to have gaining significant amount of weight since March 60 pounds Patient does state she has some blurred vision with the headache. At this time the concern is for possible increased intracranial pressure versus venous thrombosis. Patient is very unclear about how her headaches have been ongoing, if her she said just the last 2 days then she stated possibly longer and off and on for a week or longer. LP was attempted at bedside but unsuccessful. Patient currently going down to fluoroscopy for LP. Patient's MRI and MRV were normal. MRI of orbits was also normal.    Recommendations: 1) if opening pressure is significantly elevated may consider acetazolamide with outpatient neurological follow-up 2) if opening pressures normal would consider Depakote 1 g over 15 minutes along with Reglan and Benadryl for headache prophylaxis.    Will continue to  follow LP results  Felicie MornDavid Smith PA-C [facility] [phone]  07/09/2016, 9:35 AM   Neurology Attending Addendum: Case reviewed, discussed with PA. I agree with his note as documented. LP completed under fluoroscopy. Normal opening pressures documented. CSF is unremarkable. There is nothing to support increased intracranial pressure as the etiology for her headaches. I would agree that her headaches are likely migrainous in nature and would treat as such. Avoid triptan's given history of myocardial infarction. NSAIDs should be limited due to recent GI bleed. Best treatment would likely be antiemetics with Benadryl and APAP. Narcotics should be minimized as these have a tendency to increase central pain sensitization and make headaches more refractory to usual therapies. No further neurologic evaluation indicated at this time. She is okay for discharge from the neurology perspective at the discretion of the admitting physician.

## 2016-07-09 NOTE — Progress Notes (Signed)
LP under fluoro.  Clear colorless fluid.  Normal OP.  No complications.

## 2016-07-17 ENCOUNTER — Inpatient Hospital Stay (HOSPITAL_COMMUNITY)
Admission: AD | Admit: 2016-07-17 | Discharge: 2016-07-17 | Disposition: A | Discharge status: Home / Self Care | Payer: Self-pay | Source: Ambulatory Visit | Attending: Obstetrics & Gynecology | Admitting: Obstetrics & Gynecology

## 2016-07-17 ENCOUNTER — Encounter (HOSPITAL_COMMUNITY): Payer: Self-pay | Admitting: *Deleted

## 2016-07-17 ENCOUNTER — Inpatient Hospital Stay (HOSPITAL_COMMUNITY): Payer: Self-pay

## 2016-07-17 DIAGNOSIS — R109 Unspecified abdominal pain: Secondary | ICD-10-CM

## 2016-07-17 DIAGNOSIS — N8302 Follicular cyst of left ovary: Secondary | ICD-10-CM | POA: Insufficient documentation

## 2016-07-17 DIAGNOSIS — F17290 Nicotine dependence, other tobacco product, uncomplicated: Secondary | ICD-10-CM | POA: Insufficient documentation

## 2016-07-17 DIAGNOSIS — R1031 Right lower quadrant pain: Secondary | ICD-10-CM

## 2016-07-17 DIAGNOSIS — F17210 Nicotine dependence, cigarettes, uncomplicated: Secondary | ICD-10-CM | POA: Insufficient documentation

## 2016-07-17 DIAGNOSIS — G4459 Other complicated headache syndrome: Secondary | ICD-10-CM

## 2016-07-17 DIAGNOSIS — R51 Headache: Secondary | ICD-10-CM | POA: Insufficient documentation

## 2016-07-17 DIAGNOSIS — N72 Inflammatory disease of cervix uteri: Secondary | ICD-10-CM

## 2016-07-17 DIAGNOSIS — E119 Type 2 diabetes mellitus without complications: Secondary | ICD-10-CM | POA: Insufficient documentation

## 2016-07-17 HISTORY — DX: Gastrointestinal hemorrhage, unspecified: K92.2

## 2016-07-17 HISTORY — DX: Adverse effect of other nonsteroidal anti-inflammatory drugs (NSAID), initial encounter: T39.395A

## 2016-07-17 LAB — CBC
HCT: 38.6 % (ref 36.0–46.0)
HEMOGLOBIN: 12.4 g/dL (ref 12.0–15.0)
MCH: 23.6 pg — AB (ref 26.0–34.0)
MCHC: 32.1 g/dL (ref 30.0–36.0)
MCV: 73.5 fL — AB (ref 78.0–100.0)
PLATELETS: 305 10*3/uL (ref 150–400)
RBC: 5.25 MIL/uL — AB (ref 3.87–5.11)
RDW: 14.9 % (ref 11.5–15.5)
WBC: 8.4 10*3/uL (ref 4.0–10.5)

## 2016-07-17 LAB — URINALYSIS, ROUTINE W REFLEX MICROSCOPIC
Bilirubin Urine: NEGATIVE
GLUCOSE, UA: NEGATIVE mg/dL
HGB URINE DIPSTICK: NEGATIVE
Ketones, ur: NEGATIVE mg/dL
LEUKOCYTES UA: NEGATIVE
Nitrite: NEGATIVE
PROTEIN: NEGATIVE mg/dL
SPECIFIC GRAVITY, URINE: 1.025 (ref 1.005–1.030)
pH: 6.5 (ref 5.0–8.0)

## 2016-07-17 LAB — WET PREP, GENITAL
Clue Cells Wet Prep HPF POC: NONE SEEN
Sperm: NONE SEEN
TRICH WET PREP: NONE SEEN
YEAST WET PREP: NONE SEEN

## 2016-07-17 LAB — POCT PREGNANCY, URINE: Preg Test, Ur: NEGATIVE

## 2016-07-17 MED ORDER — HYDROMORPHONE HCL 2 MG PO TABS
2.0000 mg | ORAL_TABLET | Freq: Once | ORAL | Status: AC
  Administered 2016-07-17: 2 mg via ORAL
  Filled 2016-07-17: qty 1

## 2016-07-17 MED ORDER — AZITHROMYCIN 250 MG PO TABS
1000.0000 mg | ORAL_TABLET | Freq: Once | ORAL | Status: AC
  Administered 2016-07-17: 1000 mg via ORAL
  Filled 2016-07-17: qty 4

## 2016-07-17 MED ORDER — DEXAMETHASONE 0.5 MG PO TABS
1.0000 mg | ORAL_TABLET | Freq: Once | ORAL | Status: AC
  Administered 2016-07-17: 1 mg via ORAL
  Filled 2016-07-17: qty 2

## 2016-07-17 MED ORDER — METOCLOPRAMIDE HCL 10 MG PO TABS
10.0000 mg | ORAL_TABLET | Freq: Once | ORAL | Status: AC
  Administered 2016-07-17: 10 mg via ORAL
  Filled 2016-07-17: qty 1

## 2016-07-17 MED ORDER — CEFTRIAXONE SODIUM 250 MG IJ SOLR
250.0000 mg | Freq: Once | INTRAMUSCULAR | Status: AC
  Administered 2016-07-17: 250 mg via INTRAMUSCULAR
  Filled 2016-07-17: qty 250

## 2016-07-17 MED ORDER — ONDANSETRON 8 MG PO TBDP
8.0000 mg | ORAL_TABLET | Freq: Once | ORAL | Status: AC
  Administered 2016-07-17: 8 mg via ORAL
  Filled 2016-07-17: qty 1

## 2016-07-17 NOTE — MAU Note (Signed)
Pt states she was dx'd with an ovarian cyst when she was 20 years old.   When questioned about her C/O diarrhea, pt denies watery stools, says they are soft & small.  Pt is diabetic but states she hasn't taken her medication for a couple of months, hasn't gotten her prescription filled.

## 2016-07-17 NOTE — MAU Provider Note (Signed)
History     CSN: [id number]  Arrival date and time: 07/17/16 1127   First Provider Initiated Contact with Patient 07/17/16 1305      Chief Complaint  Patient presents with  . Abdominal Pain   HPI  Pt is 20 y.o.not pregnant [id number], pt of Dr. Lawana Chamberszan's, who presents with sudden onset of RLQ pain with nausea and vomiting since yesterday. (Pt states she called the office and Dr. Charlotta Newtonzan was out of the office for several weeks- an appointment was offered with another provider but pt refused). Pt states she was previously diagnosed with ovarian cyst when she was living in [place] at age 20.  Pt states she has always had terrible periods and has had to be hospitalized due to the pain.  Pt states she is on Nexplanon since August 2016 and has not had any bleeding until yesterday when she had bleeding and pain. Pt is not bleeding today, but pain continues. Pt has been with the same partner for 7 mos. Pt has not had sex for 2 to 4 weeks.  Pt has had recent work up for headaches and GI bleed. Pt is to follow up with neurologist that takes Medicaid.  Pt is on Protonix which helps. Pt is been diagnosed with "pre-diabetes" controlled with diet. Pt has had "partial" bowel movements , but not watery or loose. Pt took tylenol yesterday for headache and pain without any relief. On review of chart, pt has had chronic pelvic pain- RLQ.  Pt was seen in August with extensive work up including Pelvic ultrasound and CT of abdomen and pelvis- all clear.   RN note: Pt states she was dx'd with an ovarian cyst when she was 20 years old.   When questioned about her C/O diarrhea, pt denies watery stools, says they are soft & small.  Pt is diabetic but states she hasn't taken her medication for a couple of months, hasn't gotten her prescription filled. Pt saw Dr. Charlotta Newtonzan in August; pt got Nexplanon in August.   Pt has sudden onset of pain; with onset of bleeding accompanied by nausea.  Pt has had partial bowel movements. Pt  took Tylenol last night around 11:30pm- pt threw up.     Past Medical History:  Diagnosis Date  . Anxiety   . Diabetes mellitus without complication (HCC)   . GI bleed due to NSAIDs   . Myocardial infarction     Past Surgical History:  Procedure Laterality Date  . ESOPHAGOGASTRODUODENOSCOPY N/A 07/07/2016   Procedure: ESOPHAGOGASTRODUODENOSCOPY (EGD);  Surgeon: Ruffin FrederickSteven Paul Armbruster, MD;  Location: [facility] ENDOSCOPY;  Service: Gastroenterology;  Laterality: N/A;    Family History  Problem Relation Age of Onset  . Hyperlipidemia Mother   . Mental illness Mother   . Hyperlipidemia Father   . Mental illness Brother   . Heart disease Paternal Grandfather   . Hyperlipidemia Paternal Grandfather   . Hypertension Paternal Grandfather   . CAD Other   . Cancer Other     Social History  Substance Use Topics  . Smoking status: Current Some Day Smoker    Types: Cigarettes, Cigars  . Smokeless tobacco: Current User  . Alcohol use Yes     Comment: social    Allergies:  Allergies  Allergen Reactions  . Tramadol Itching  . Latex Itching, Swelling and Rash    Reaction to condoms    Prescriptions Prior to Admission  Medication Sig Dispense Refill Last Dose  . acetaminophen (TYLENOL) 325 MG tablet Take 2  tablets (650 mg total) by mouth every 6 (six) hours as needed for moderate pain (headache).     . ondansetron (ZOFRAN ODT) 4 MG disintegrating tablet Take 1 tablet (4 mg total) by mouth every 8 (eight) hours as needed for nausea or vomiting. 20 tablet 0   . pantoprazole (PROTONIX) 40 MG tablet Take 1 tablet (40 mg total) by mouth daily at 12 noon. 30 tablet 0     Review of Systems  Constitutional: Negative for chills and fever.  Eyes: Positive for photophobia. Negative for blurred vision.  Respiratory: Negative for cough.   Cardiovascular: Negative for chest pain.  Gastrointestinal: Positive for abdominal pain, nausea and vomiting. Negative for constipation and diarrhea.   Genitourinary: Negative for dysuria.  Neurological: Positive for dizziness and headaches.   Physical Exam   Blood pressure 128/67, pulse 98, temperature 97.3 F (36.3 C), temperature source Oral, resp. rate 18, SpO2 99 %.  Physical Exam  Nursing note and vitals reviewed. Constitutional: She is oriented to person, place, and time. She appears well-developed and well-nourished. No distress.  HENT:  Head: Normocephalic.  Eyes: Pupils are equal, round, and reactive to light.  Neck: Normal range of motion. Neck supple.  Cardiovascular: Normal rate.   Respiratory: Effort normal.  GI: Soft. She exhibits no distension. There is tenderness. There is guarding. There is no rebound.  Genitourinary:  Genitourinary Comments: No bleeding noted; small amount of white watery discharge in vault; cervix closed, clean, but +CMT; no palpable enlargement- right adnexa tender without palpable enlargemen- left side not tender or palpably enlarged  Musculoskeletal: Normal range of motion.  Neurological: She is alert and oriented to person, place, and time.  Skin: Skin is warm and dry.  Psychiatric: She has a normal mood and affect.    MAU Course  Procedures Zofran 8mg  ODT Dilaudid 2 mg tablet- pt not heaving but pt states she still has pain Reglan and Decadron PO given Results for orders placed or performed during the hospital encounter of 07/17/16 (from the past 24 hour(s))  Urinalysis, Routine w reflex microscopic (not at [facility])     Status: None   Collection Time: 07/17/16 11:45 AM  Result Value Ref Range   Color, Urine YELLOW YELLOW   APPearance CLEAR CLEAR   Specific Gravity, Urine 1.025 1.005 - 1.030   pH 6.5 5.0 - 8.0   Glucose, UA NEGATIVE NEGATIVE mg/dL   Hgb urine dipstick NEGATIVE NEGATIVE   Bilirubin Urine NEGATIVE NEGATIVE   Ketones, ur NEGATIVE NEGATIVE mg/dL   Protein, ur NEGATIVE NEGATIVE mg/dL   Nitrite NEGATIVE NEGATIVE   Leukocytes, UA NEGATIVE NEGATIVE  Pregnancy, urine POC      Status: None   Collection Time: 07/17/16 11:55 AM  Result Value Ref Range   Preg Test, Ur NEGATIVE NEGATIVE  CBC     Status: Abnormal   Collection Time: 07/17/16  1:25 PM  Result Value Ref Range   WBC 8.4 4.0 - 10.5 K/uL   RBC 5.25 (H) 3.87 - 5.11 MIL/uL   Hemoglobin 12.4 12.0 - 15.0 g/dL   HCT 16.1 09.6 - 04.5 %   MCV 73.5 (L) 78.0 - 100.0 fL   MCH 23.6 (L) 26.0 - 34.0 pg   MCHC 32.1 30.0 - 36.0 g/dL   RDW 40.9 81.1 - 91.4 %   Platelets 305 150 - 400 K/uL  Wet prep, genital     Status: Abnormal   Collection Time: 07/17/16  2:14 PM  Result Value Ref Range  Yeast Wet Prep HPF POC NONE SEEN NONE SEEN   Trich, Wet Prep NONE SEEN NONE SEEN   Clue Cells Wet Prep HPF POC NONE SEEN NONE SEEN   WBC, Wet Prep HPF POC FEW (A) NONE SEEN   Sperm NONE SEEN   GC/chlamydia pending [place] Transvaginal Non-ob  Result Date: 07/17/2016 CLINICAL DATA:  Acute right lower quadrant abdominal pelvic pain. EXAM: ULTRASOUND PELVIS TRANSVAGINAL TECHNIQUE: Transvaginal ultrasound examination of the pelvis was performed including evaluation of the uterus, ovaries, adnexal regions, and pelvic cul-de-sac. COMPARISON:  05/04/2016 FINDINGS: Uterus Measurements: 6.3 x 3.2 x 4.3 cm. No fibroids or other mass visualized. Endometrium Thickness: 5.9 mm. Mildly irregular with trace amount of endometrial cavity fluid noted, nonspecific in appearance. Right ovary Measurements: 3.6 x 2.1 x 3.1 cm. Left ovary contains a simple appearing 2.7 x 1.7 cm anechoic cyst. Left ovary Measurements: 1.9 x 1.3 x 2.1 cm. Normal appearance/no adnexal mass. Other findings:  Trace pelvic free fluid likely physiologic IMPRESSION: No acute finding by pelvic ultrasound 2.7 cm left ovarian simple cyst Trace pelvic free fluid, suspect physiologic Electronically Signed   By: Judie PetitM.  Shick M.D.   On: 07/17/2016 16:07  discussed with pt about treating for cervicitis- will go ahead and give Rocephin and Zithromax Headache resolved and abd pain improved with  Dilauidid, reglan and decadron Nausea resolved with zofran Discussed results of ultrasound with pt and review of past imagine results- advised pt to continue Nexplanon and follow up with Dr. Charlotta Newtonzan Assessment and Plan  Acute onset of RLQ pain(recurrent)- left follicular cyst Cervicitis- Rocephin and Zithromax in MAU Headache- pursue neurological appointment F/u with Dr. Nelly Laurencezan  Zanita Millman 07/17/2016, 1:05 PM

## 2016-07-17 NOTE — MAU Note (Signed)
Had an ovarian cyst rupture last night. Pain in RLQ.  Has a migraine, been throwing up and is dizzy. Started having diarrhea yesterday.

## 2016-07-18 LAB — GC/CHLAMYDIA PROBE AMP (~~LOC~~) NOT AT ARMC
CHLAMYDIA, DNA PROBE: NEGATIVE
Neisseria Gonorrhea: NEGATIVE

## 2016-07-26 ENCOUNTER — Ambulatory Visit: Payer: Medicaid Other | Attending: Obstetrics & Gynecology | Admitting: Physical Therapy

## 2016-07-26 ENCOUNTER — Encounter: Payer: Self-pay | Admitting: Physical Therapy

## 2016-07-26 DIAGNOSIS — R293 Abnormal posture: Secondary | ICD-10-CM

## 2016-07-26 DIAGNOSIS — M62838 Other muscle spasm: Secondary | ICD-10-CM

## 2016-07-26 DIAGNOSIS — M6281 Muscle weakness (generalized): Secondary | ICD-10-CM

## 2016-07-26 NOTE — Patient Instructions (Signed)
About Abdominal Massage  Abdominal massage, also called external colon massage, is a self-treatment circular massage technique that can reduce and eliminate gas and ease constipation. The colon naturally contracts in waves in a clockwise direction starting from inside the right hip, moving up toward the ribs, across the belly, and down inside the left hip.  When you perform circular abdominal massage, you help stimulate your colon's normal wave pattern of movement called peristalsis.  It is most beneficial when done after eating.  Positioning You can practice abdominal massage with oil while lying down, or in the shower with soap.  Some people find that it is just as effective to do the massage through clothing while sitting or standing.  How to Massage Start by placing your finger tips or knuckles on your right side, just inside your hip bone.  . Make small circular movements while you move upward toward your rib cage.   . Once you reach the bottom right side of your rib cage, take your circular movements across to the left side of the bottom of your rib cage.  . Next, move downward until you reach the inside of your left hip bone.  This is the path your feces travel in your colon. . Continue to perform your abdominal massage in this pattern for 10 minutes each day.     You can apply as much pressure as is comfortable in your massage.  Start gently and build pressure as you continue to practice.  Notice any areas of pain as you massage; areas of slight pain may be relieved as you massage, but if you have areas of significant or intense pain, consult with your healthcare provider.  Other Considerations . General physical activity including bending and stretching can have a beneficial massage-like effect on the colon.  Deep breathing can also stimulate the colon because breathing deeply activates the same nervous system that supplies the colon.   . Abdominal massage should always be used in  combination with a bowel-conscious diet that is high in the proper type of fiber for you, fluids (primarily water), and a regular exercise program. Butterfly, Supine    Lie on back, feet together. Lower knees toward floor. Place pillows underneath your knees.  Hold _30__ seconds. Repeat _3__ times per session. Do ___ sessions per day.  Copyright  VHI. All rights reserved.

## 2016-07-26 NOTE — Therapy (Addendum)
[facility] [address] [place], [place], [address] Phone: [phone]   Fax:  [phone]  Physical Therapy Evaluation  Patient Details  Name: Ashlee Thompson MRN: [id number] Date of Birth: 02/24/1996 Referring Provider: Janyth Pupa  Encounter Date: 07/26/2016      PT End of Session - 07/26/16 1610    Visit Number 1   Date for PT Re-Evaluation 09/20/16   PT Start Time 1455   PT Stop Time 1600   PT Time Calculation (min) 65 min   Activity Tolerance Patient limited by pain   Behavior During Therapy [facility] for tasks assessed/performed      Past Medical History:  Diagnosis Date  . Anxiety   . Diabetes mellitus without complication ([place])   . GI bleed due to NSAIDs   . Myocardial infarction     Past Surgical History:  Procedure Laterality Date  . ESOPHAGOGASTRODUODENOSCOPY N/A 07/07/2016   Procedure: ESOPHAGOGASTRODUODENOSCOPY (EGD);  Surgeon: Manus Gunning, MD;  Location: [facility];  Service: Gastroenterology;  Laterality: N/A;    There were no vitals filed for this visit.       Subjective Assessment - 07/26/16 1456    Subjective Pt referred to PT for pelvic pain. Pain has been getting worse since pt was 20 y/o.  States she had ulcers in bladder and ovaries at a young age.  Currently pain is causing nausea and vomiting when it gets bad.  Pt states it is mainly in lower abdomen moving around to different locations.   Limitations Standing;Walking   How long can you walk comfortably? a few minutes   Patient Stated Goals to understand what is wrong and know that she is not "broken"   Currently in Pain? Yes   Pain Score 6    Pain Location Abdomen   Pain Orientation Mid  can go side to side    Pain Descriptors / Indicators Pressure;Tightness  "hot iron pressed against tummy"   Pain Type Acute pain  flare up of chronic pain   Pain Onset 1 to 4 weeks ago   Pain Frequency Intermittent  constant  for 1-2 weeks   Aggravating Factors  being active, walking, stress, increased,    Pain Relieving Factors pressure on the abdomen   Effect of Pain on Daily Activities unable to keep jobs due to getting sick due to pain   Multiple Pain Sites Yes   Pain Score 6   Pain Location Vagina   Pain Descriptors / Indicators Discomfort;Heaviness;Pressure   Pain Frequency Intermittent            [facility] PT Assessment - 07/26/16 0001      Assessment   Medical Diagnosis R10.2 pelvic pain   Referring Provider Janyth Pupa   Onset Date/Surgical Date --  around menstruation 11 y/0   Hand Dominance --  both   Next MD Visit --  will schedule   Prior Therapy no     Precautions   Precautions None     Restrictions   Weight Bearing Restrictions No     Balance Screen   Has the patient fallen in the past 6 months No   Has the patient had a decrease in activity level because of a fear of falling?  No   Is the patient reluctant to leave their home because of a fear of falling?  No     Home Ecologist residence     Prior Function   Level  of Independence Independent     Cognition   Overall Cognitive Status Within Functional Limits for tasks assessed     Observation/Other Assessments   Observations Left SLR difficult with SI compression and no compression   Focus on Therapeutic Outcomes (FOTO)  39% limitation  28% limitation     Posture/Postural Control   Posture/Postural Control Postural limitations   Postural Limitations Rounded Shoulders  slouched posture in sitting     ROM / Strength   AROM / PROM / Strength AROM;Strength;PROM     AROM   Lumbar Flexion 75% limitation   Lumbar - Right Side Bend 50% limitation   Lumbar - Left Side Bend 50% limitation     PROM   Right/Left Hip --   Left Hip Flexion 90  pain   Left Hip Internal Rotation  --  50% limited from right, painful     Strength   Overall Strength Unable to assess;Due to pain     Right  Hip   Right Hip Flexion 90  painful     Special Tests    Special Tests Lumbar   Lumbar Tests Straight Leg Raise     Straight Leg Raise   Findings Positive   Side  Left   Comment no change with compression     Ambulation/Gait   Ambulation/Gait No                 Pelvic Floor Special Questions - 07/26/16 0001    Prior Pregnancies No   Currently Sexually Active Yes  not for past month   Is this Painful Yes  would get uncomfortable   Urinary Leakage Yes   How often 1-2 x /wk  can gush or trickle   Pad use no   Activities that cause leaking --  unknown   Urinary frequency yes  2x/hour around 20x day   Fecal incontinence No   Fluid intake 8-15 (16oz bottle)   Caffeine beverages none   External Perineal Exam Patient confirms identificaiton and approves PT to assess pelvic floor muscle interity   Skin Integrity Intact   External Palpation tender at B transverse peroneus attachment sites, B hip adductor attachment sites, B iliopsoas   Pelvic Floor Internal Exam tender everywhere, tissue felt inflamed    Exam Type Vaginal   Pelvic Pain Goals Patient will be independent with completion of home exercise program for stretching and stabilization exercises.;Patient will not report having urinary urgency throughout the day.;Patient will be able to have intercourse with minimal to no pain;Patient will be able to achieve a normal standing and sitting posture to help alleviate pain at joints and muscles.          [facility] Adult PT Treatment/Exercise - 07/26/16 0001      Therapeutic Activites    Therapeutic Activities Other Therapeutic Activities   Other Therapeutic Activities edu and performed diaphragmatic breathing for relaxation of pelvic muscles with and without incorporating butterfly stretch; abdominal massage edu and performed for self massage techniques      Manual Therapy   Manual Therapy Soft tissue mobilization;Myofascial release   Soft tissue mobilization iliopsoas    Myofascial Release abdominals, diaphragm                PT Education - 07/26/16 1609    Education provided Yes   Education Details edu on abdominal massage, use of heat, butterfly stretch, diaphragmatic breathing   Person(s) Educated Patient   Methods Explanation;Demonstration;Tactile cues;Verbal cues;Handout   Comprehension Verbalized understanding;Returned demonstration  PT Short Term Goals - 07/26/16 1655      PT SHORT TERM GOAL #1   Title independent with initial HEP   Time 4   Period Weeks   Status New     PT SHORT TERM GOAL #2   Title able to walk at lease 10 minutes without increased pain   Time 4   Period Weeks   Status New     PT SHORT TERM GOAL #3   Title decreased irritation of pelvic floor muscle tissue to tolerate manual to pelvic floor muscle and work towards being able to return to sexual function   Time 4   Period Weeks   Status New     PT SHORT TERM GOAL #4   Title reduce urinary frequency to 1x every two hours for healthy bladder function   Time 4   Period Weeks   Status New           PT Long Term Goals - 07/26/16 1700      PT LONG TERM GOAL #1   Title pain reduced by 50% and not reaching a point where she is getting sick due to pain   Baseline 6/10 and causing nausea and vomiting when at its worst   Time 8   Period Weeks   Status New     PT LONG TERM GOAL #2   Title FOTO limited 28% at most   Baseline 39%   Time 8   Period Weeks   Status New     PT LONG TERM GOAL #3   Title able to tolerate penile penetration during sex   Baseline not sexually active due to pain   Time 8   Period Weeks   Status New     PT LONG TERM GOAL #4   Title able to walk and stand >60 minutes without increased pain   Baseline 3 minutes   Time 8   Period Weeks   Status New               Plan - 07/26/16 1641    Clinical Impression Statement Patient received moderate complexity eval due to worsening condition that has been  chronic since age 68 with multiple contributing factors and comorbidities including ulcers, obesity, diabetes as well as more than needing to examin more than one area of the body including pelvic floor, hips abdomen.  Patient demonstrates pelvic floor weakness with difficulty getting good movement while contracting and relaxing, chronic pain that comes and goes and can be at a level that causes patient to feel ill and is currently 6/10.  Pain located in abdomen and vaginal regions with activities such as walking, standing, household tasks.  Pt is also impacted by busy schedule in which she attends a very full case load at school as well as tries to work in order to help pay for her education.  Currently, she is unable to work due to pain causing sickness.  She is also having difficulty with sexual function and pain prevents her from being sexually active with her boyfriend.  Skilled PT is needed to help pt return to function by using techniques to help patient regain muscle function and strengthen, reduce muscle spasms, and reduce of pain.   Rehab Potential Good   Clinical Impairments Affecting Rehab Potential none   PT Frequency 2x / week   PT Duration 8 weeks   PT Treatment/Interventions Biofeedback;Cryotherapy;ADLs/Self Care Home Management;Electrical Stimulation;Moist Heat;Functional mobility training;Therapeutic activities;Therapeutic exercise;Neuromuscular re-education;Patient/family education;Manual techniques;Passive range  of motion   PT Next Visit Plan STM and muscle testing to BLE and pelvic floor if tolerated   PT Home Exercise Plan progress as needed - butterfly stretch, diaphragmatic breathing, abdominal massage, F/U on initial HEP   Recommended Other Services none   Consulted and Agree with Plan of Care Patient      Patient will benefit from skilled therapeutic intervention in order to improve the following deficits and impairments:  Decreased activity tolerance, Decreased mobility,  Decreased endurance, Decreased strength, Difficulty walking, Pain, Increased muscle spasms, Postural dysfunction  Visit Diagnosis: Other muscle spasm - Plan: PT plan of care cert/re-cert  Muscle weakness (generalized) - Plan: PT plan of care cert/re-cert  Abnormal posture - Plan: PT plan of care cert/re-cert     Problem List Patient Active Problem List   Diagnosis Date Noted  . Headache   . UGI bleed 07/07/2016  . History of diabetes mellitus 07/07/2016  . Morbid obesity ([place]) 07/07/2016  . Hematemesis with nausea 07/07/2016  . GI bleed 07/07/2016    Zannie Cove, PT 07/26/2016, 5:14 PM  [facility] [address] [place], [place], [address] Phone: [phone]   Fax:  [phone]  Name: Ashlee Thompson MRN: [id number] Date of Birth: 08/23/1996  PHYSICAL THERAPY DISCHARGE SUMMARY  Visits from Start of Care: 1  Current functional level related to goals / functional outcomes: Only came for eval, see above   Remaining deficits: Only came for eval, see above   Education / Equipment: See above Plan:                                                    Patient goals were not met. Patient is being discharged due to not returning since the last visit.  ?????

## 2016-07-27 ENCOUNTER — Inpatient Hospital Stay (HOSPITAL_COMMUNITY)
Admission: AD | Admit: 2016-07-27 | Discharge: 2016-07-27 | Disposition: A | Discharge status: Home / Self Care | Payer: Medicaid Other | Source: Ambulatory Visit | Attending: Obstetrics & Gynecology | Admitting: Obstetrics & Gynecology

## 2016-07-27 ENCOUNTER — Encounter (HOSPITAL_COMMUNITY): Payer: Self-pay | Admitting: *Deleted

## 2016-07-27 DIAGNOSIS — F17210 Nicotine dependence, cigarettes, uncomplicated: Secondary | ICD-10-CM | POA: Insufficient documentation

## 2016-07-27 DIAGNOSIS — E119 Type 2 diabetes mellitus without complications: Secondary | ICD-10-CM | POA: Insufficient documentation

## 2016-07-27 DIAGNOSIS — F419 Anxiety disorder, unspecified: Secondary | ICD-10-CM | POA: Insufficient documentation

## 2016-07-27 DIAGNOSIS — R102 Pelvic and perineal pain: Secondary | ICD-10-CM | POA: Insufficient documentation

## 2016-07-27 DIAGNOSIS — G8929 Other chronic pain: Secondary | ICD-10-CM | POA: Insufficient documentation

## 2016-07-27 DIAGNOSIS — N3010 Interstitial cystitis (chronic) without hematuria: Secondary | ICD-10-CM

## 2016-07-27 DIAGNOSIS — I252 Old myocardial infarction: Secondary | ICD-10-CM | POA: Insufficient documentation

## 2016-07-27 DIAGNOSIS — Z8249 Family history of ischemic heart disease and other diseases of the circulatory system: Secondary | ICD-10-CM | POA: Insufficient documentation

## 2016-07-27 DIAGNOSIS — R112 Nausea with vomiting, unspecified: Secondary | ICD-10-CM | POA: Insufficient documentation

## 2016-07-27 DIAGNOSIS — N83202 Unspecified ovarian cyst, left side: Secondary | ICD-10-CM | POA: Insufficient documentation

## 2016-07-27 LAB — POCT PREGNANCY, URINE: PREG TEST UR: NEGATIVE

## 2016-07-27 LAB — URINALYSIS, ROUTINE W REFLEX MICROSCOPIC
BILIRUBIN URINE: NEGATIVE
Glucose, UA: NEGATIVE mg/dL
Ketones, ur: NEGATIVE mg/dL
Leukocytes, UA: NEGATIVE
NITRITE: NEGATIVE
PROTEIN: 30 mg/dL — AB
SPECIFIC GRAVITY, URINE: 1.025 (ref 1.005–1.030)
pH: 6.5 (ref 5.0–8.0)

## 2016-07-27 LAB — URINE MICROSCOPIC-ADD ON
BACTERIA UA: NONE SEEN
WBC, UA: NONE SEEN WBC/hpf (ref 0–5)

## 2016-07-27 MED ORDER — PHENAZOPYRIDINE HCL 100 MG PO TABS
200.0000 mg | ORAL_TABLET | Freq: Once | ORAL | Status: AC
  Administered 2016-07-27: 200 mg via ORAL
  Filled 2016-07-27: qty 2

## 2016-07-27 MED ORDER — KETOROLAC TROMETHAMINE 60 MG/2ML IM SOLN
60.0000 mg | Freq: Once | INTRAMUSCULAR | Status: AC
  Administered 2016-07-27: 60 mg via INTRAMUSCULAR
  Filled 2016-07-27: qty 2

## 2016-07-27 MED ORDER — PHENAZOPYRIDINE HCL 200 MG PO TABS: 200.0000 mg | ORAL_TABLET | Freq: Three times a day (TID) | ORAL | 1 refills | Status: DC | PRN

## 2016-07-27 MED ORDER — DICYCLOMINE HCL 20 MG PO TABS
20.0000 mg | ORAL_TABLET | Freq: Once | ORAL | Status: AC
  Administered 2016-07-27: 20 mg via ORAL
  Filled 2016-07-27: qty 1

## 2016-07-27 NOTE — MAU Provider Note (Signed)
Chief Complaint:  Nausea; Emesis; Abdominal Pain; and Vaginal Bleeding   First Provider Initiated Contact with Patient 07/27/16 0831     HPI: Ashlee Thompson is a 20 y.o. G0P0000 who presents to maternity admissions reporting recurrent severe pelvic pain which worsened this morning.  States it is in the center of lower pelvis.  Also has nausea and vomiting. Denies diarrhea or constipation. States had multiple "partial" stools yesterday.  Has hx of constipation.    She reports vaginal bleeding, which started this morning, which is rare due to Nexplanon which was place a year ago.   Denies vaginal itching/burning, urinary symptoms, h/a, dizziness, n/v, or fever/chills.    Prior HPI from 07/17/16 visit: Pt is 20 y.o.not pregnant G0P0000, pt of Dr. Lawana Chambers, who presents with sudden onset of RLQ pain with nausea and vomiting since yesterday. (Pt states she called the office and Dr. Charlotta Newton was out of the office for several weeks- an appointment was offered with another provider but pt refused). Pt states she was previously diagnosed with ovarian cyst when she was living in [place] at age 41.  Pt states she has always had terrible periods and has had to be hospitalized due to the pain.  Pt states she is on Nexplanon since August 2016 and has not had any bleeding until yesterday when she had bleeding and pain. Pt is not bleeding today, but pain continues. Pt has been with the same partner for 7 mos. Pt has not had sex for 2 to 4 weeks.  Pt has had recent work up for headaches and GI bleed. Pt is to follow up with neurologist that takes Medicaid.  Pt is on Protonix which helps. Pt is been diagnosed with "pre-diabetes" controlled with diet. Pt has had "partial" bowel movements , but not watery or loose. Pt took tylenol yesterday for headache and pain without any relief. On review of chart, pt has had chronic pelvic pain- RLQ.  Pt was seen in August with extensive work up including Pelvic ultrasound and CT of abdomen  and pelvis- all clear. Abdominal Pain  This is a recurrent problem. The current episode started today. The onset quality is gradual. The problem occurs intermittently. The problem has been gradually worsening. The pain is located in the suprapubic region, RLQ and LLQ. The pain is severe. The quality of the pain is burning, cramping and sharp. The abdominal pain radiates to the back. Associated symptoms include anorexia and nausea. Pertinent negatives include no constipation, diarrhea, fever, myalgias or vomiting. The pain is aggravated by palpation, certain positions and movement. The pain is relieved by nothing. She has tried acetaminophen for the symptoms.    Past Medical History: Past Medical History:  Diagnosis Date  . Anxiety   . Diabetes mellitus without complication (HCC)   . GI bleed due to NSAIDs   . Myocardial infarction     Past obstetric history: OB History  Gravida Para Term Preterm AB Living  0 0 0 0 0 0  SAB TAB Ectopic Multiple Live Births  0 0 0 0 0        Past Surgical History: Past Surgical History:  Procedure Laterality Date  . ESOPHAGOGASTRODUODENOSCOPY N/A 07/07/2016   Procedure: ESOPHAGOGASTRODUODENOSCOPY (EGD);  Surgeon: Ruffin Frederick, MD;  Location: [facility] ENDOSCOPY;  Service: Gastroenterology;  Laterality: N/A;    Family History: Family History  Problem Relation Age of Onset  . Hyperlipidemia Mother   . Mental illness Mother   . Hyperlipidemia Father   . Mental  illness Brother   . Heart disease Paternal Grandfather   . Hyperlipidemia Paternal Grandfather   . Hypertension Paternal Grandfather   . CAD Other   . Cancer Other     Social History: Social History  Substance Use Topics  . Smoking status: Current Some Day Smoker    Packs/day: 0.25    Types: Cigars  . Smokeless tobacco: Current User  . Alcohol use Yes     Comment: social    Allergies:  Allergies  Allergen Reactions  . Tramadol Hives and Itching  . Latex Itching,  Swelling and Rash    Reaction to condoms    Meds:  Prescriptions Prior to Admission  Medication Sig Dispense Refill Last Dose  . acetaminophen (TYLENOL) 325 MG tablet Take 2 tablets (650 mg total) by mouth every 6 (six) hours as needed for moderate pain (headache). (Patient not taking: Reported on 07/17/2016)   Not Taking at Unknown time  . ondansetron (ZOFRAN ODT) 4 MG disintegrating tablet Take 1 tablet (4 mg total) by mouth every 8 (eight) hours as needed for nausea or vomiting. (Patient not taking: Reported on 07/17/2016) 20 tablet 0 Not Taking at Unknown time  . pantoprazole (PROTONIX) 40 MG tablet Take 1 tablet (40 mg total) by mouth daily at 12 noon. (Patient not taking: Reported on 07/17/2016) 30 tablet 0 Not Taking at Unknown time    I have reviewed patient's Past Medical Hx, Surgical Hx, Family Hx, Social Hx, medications and allergies.  ROS:  Review of Systems  Constitutional: Negative for fever.  Gastrointestinal: Positive for abdominal pain, anorexia and nausea. Negative for constipation, diarrhea and vomiting.  Musculoskeletal: Negative for myalgias.   Other systems negative     Physical Exam  Patient Vitals for the past 24 hrs:  BP Temp Temp src Pulse Resp  07/27/16 0802 131/87 97.9 F (36.6 C) Oral 102 20   Constitutional: Well-developed, well-nourished female in no acute distress, but rocking and moaning in pain.  Cardiovascular: normal rate and rhythm, no ectopy audible, S1 & S2 heard, no murmur Respiratory: normal effort, no distress. Lungs CTAB with no wheezes or crackles GI: Abd soft, tender throughout lower abdomen.  Nondistended.  No rebound, No guarding.  Bowel Sounds audible  MS: Extremities nontender, no edema, normal ROM Neurologic: Alert and oriented x 4.   Grossly nonfocal. GU: Neg CVAT.   Skin:  Warm and Dry Psych:  Affect appropriate.  PELVIC EXAM: Cervix pink, visually closed, without lesion, scant white creamy discharge, vaginal walls and  external genitalia normal Bimanual exam: Cervix firm, anterior, neg CMT, uterus tender, difficult to feel due to habitus, adnexa with bilateral tenderness, enlargement, or mass    Labs: Results for orders placed or performed during the hospital encounter of 07/27/16 (from the past 24 hour(s))  Urinalysis, Routine w reflex microscopic ([facility])     Status: Abnormal   Collection Time: 07/27/16  7:55 AM  Result Value Ref Range   Color, Urine YELLOW YELLOW   APPearance CLEAR CLEAR   Specific Gravity, Urine 1.025 1.005 - 1.030   pH 6.5 5.0 - 8.0   Glucose, UA NEGATIVE NEGATIVE mg/dL   Hgb urine dipstick LARGE (A) NEGATIVE   Bilirubin Urine NEGATIVE NEGATIVE   Ketones, ur NEGATIVE NEGATIVE mg/dL   Protein, ur 30 (A) NEGATIVE mg/dL   Nitrite NEGATIVE NEGATIVE   Leukocytes, UA NEGATIVE NEGATIVE  Urine microscopic-add on     Status: Abnormal   Collection Time: 07/27/16  7:55 AM  Result  Value Ref Range   Squamous Epithelial / LPF 0-5 (A) NONE SEEN   WBC, UA NONE SEEN 0 - 5 WBC/hpf   RBC / HPF TOO NUMEROUS TO COUNT 0 - 5 RBC/hpf   Bacteria, UA NONE SEEN NONE SEEN  Pregnancy, urine POC     Status: None   Collection Time: 07/27/16  8:10 AM  Result Value Ref Range   Preg Test, Ur NEGATIVE NEGATIVE   --/--/B POS, B POS (10/21 0710)  Imaging:  Dg Chest 2 View  Result Date: 07/07/2016 CLINICAL DATA:  20 year old female with cough. EXAM: CHEST  2 VIEW COMPARISON:  Chest radiograph dated 05/17/2016 FINDINGS: The heart size and mediastinal contours are within normal limits. Both lungs are clear. The visualized skeletal structures are unremarkable. IMPRESSION: No active cardiopulmonary disease. Electronically Signed   By: Elgie Collard M.D.   On: 07/07/2016 04:42   Dg Lumbar Spine 2-3 Views  Result Date: 07/08/2016 CLINICAL DATA:  Fall, lower back pain EXAM: LUMBAR SPINE - 2-3 VIEW COMPARISON:  05/17/2016 FINDINGS: Three views of the lumbar spine submitted. No acute fracture or  subluxation. Alignment, disc spaces and vertebral body heights are preserved. IMPRESSION: Negative. Electronically Signed   By: Natasha Mead M.D.   On: 07/08/2016 09:08   Mr Laqueta Jean ZO Contrast  Result Date: 07/08/2016 CLINICAL DATA:  Blurred vision and headache. EXAM: MRI HEAD AND ORBITS WITHOUT AND WITH CONTRAST MR VENOGRAM HEAD WITHOUT CONTRAST TECHNIQUE: Multiplanar, multiecho pulse sequences of the brain and surrounding structures were obtained without and with intravenous contrast. Multiplanar, multiecho pulse sequences of the orbits and surrounding structures were obtained including fat saturation techniques, before and after intravenous contrast administration. Angiographic images of the intracranial venous structures were obtained using MRV technique without intravenous contrast. CONTRAST:  20mL MULTIHANCE GADOBENATE DIMEGLUMINE 529 MG/ML IV SOLN COMPARISON:  Brain MRI 07/08/2015 FINDINGS: MRI HEAD FINDINGS Brain: There is no evidence of acute infarct, intracranial hemorrhage, mass, midline shift, or extra-axial fluid collection. The ventricles and sulci are normal. The brain is normal in signal. No abnormal enhancement is identified. Vascular: Major intracranial vascular flow voids are preserved. Skull and upper cervical spine: No focal osseous lesion. Other: None. MRI ORBITS FINDINGS Orbits: The globes appear intact within limitations of mild motion and susceptibility artifact. The optic nerves are symmetric and normal in appearance without evidence of abnormal enhancement. The extraocular muscles and lacrimal glands are unremarkable. No orbital mass or inflammatory change is identified. Optic chiasm is unremarkable. Visualized sinuses: Clear. Soft tissues: Negative. Limited intracranial: Unremarkable. MR VENOGRAM HEAD FINDINGS Superior sagittal sinus, internal cerebral veins, vein of Galen, straight sinus, transverse sinuses, sigmoid sinuses, and jugular bulbs appear patent without evidence of  thrombosis. The right transverse and sigmoid sinuses are mildly dominant, and poor visualization of portions of the left transverse and left sigmoid sinuses is felt to be artifactual given their normal appearance on the concurrent conventional brain MRI. IMPRESSION: 1. Unremarkable appearance of the brain and orbits. 2. No evidence of dural venous sinus thrombosis. Electronically Signed   By: Sebastian Ache M.D.   On: 07/08/2016 17:21   US Transvaginal Non-ob  Result Date: 07/17/2016 CLINICAL DATA:  Acute right lower quadrant abdominal pelvic pain. EXAM: ULTRASOUND PELVIS TRANSVAGINAL TECHNIQUE: Transvaginal ultrasound examination of the pelvis was performed including evaluation of the uterus, ovaries, adnexal regions, and pelvic cul-de-sac. COMPARISON:  05/04/2016 FINDINGS: Uterus Measurements: 6.3 x 3.2 x 4.3 cm. No fibroids or other mass visualized. Endometrium Thickness: 5.9 mm. Mildly irregular with  trace amount of endometrial cavity fluid noted, nonspecific in appearance. Right ovary Measurements: 3.6 x 2.1 x 3.1 cm. Left ovary contains a simple appearing 2.7 x 1.7 cm anechoic cyst. Left ovary Measurements: 1.9 x 1.3 x 2.1 cm. Normal appearance/no adnexal mass. Other findings:  Trace pelvic free fluid likely physiologic IMPRESSION: No acute finding by pelvic ultrasound 2.7 cm left ovarian simple cyst Trace pelvic free fluid, suspect physiologic Electronically Signed   By: Judie Petit.  Shick M.D.   On: 07/17/2016 16:07   Mr Mrv Head Wo Cm  Result Date: 07/08/2016 CLINICAL DATA:  Blurred vision and headache. EXAM: MRI HEAD AND ORBITS WITHOUT AND WITH CONTRAST MR VENOGRAM HEAD WITHOUT CONTRAST TECHNIQUE: Multiplanar, multiecho pulse sequences of the brain and surrounding structures were obtained without and with intravenous contrast. Multiplanar, multiecho pulse sequences of the orbits and surrounding structures were obtained including fat saturation techniques, before and after intravenous contrast  administration. Angiographic images of the intracranial venous structures were obtained using MRV technique without intravenous contrast. CONTRAST:  20mL MULTIHANCE GADOBENATE DIMEGLUMINE 529 MG/ML IV SOLN COMPARISON:  Brain MRI 07/08/2015 FINDINGS: MRI HEAD FINDINGS Brain: There is no evidence of acute infarct, intracranial hemorrhage, mass, midline shift, or extra-axial fluid collection. The ventricles and sulci are normal. The brain is normal in signal. No abnormal enhancement is identified. Vascular: Major intracranial vascular flow voids are preserved. Skull and upper cervical spine: No focal osseous lesion. Other: None. MRI ORBITS FINDINGS Orbits: The globes appear intact within limitations of mild motion and susceptibility artifact. The optic nerves are symmetric and normal in appearance without evidence of abnormal enhancement. The extraocular muscles and lacrimal glands are unremarkable. No orbital mass or inflammatory change is identified. Optic chiasm is unremarkable. Visualized sinuses: Clear. Soft tissues: Negative. Limited intracranial: Unremarkable. MR VENOGRAM HEAD FINDINGS Superior sagittal sinus, internal cerebral veins, vein of Galen, straight sinus, transverse sinuses, sigmoid sinuses, and jugular bulbs appear patent without evidence of thrombosis. The right transverse and sigmoid sinuses are mildly dominant, and poor visualization of portions of the left transverse and left sigmoid sinuses is felt to be artifactual given their normal appearance on the concurrent conventional brain MRI. IMPRESSION: 1. Unremarkable appearance of the brain and orbits. 2. No evidence of dural venous sinus thrombosis. Electronically Signed   By: Sebastian Ache M.D.   On: 07/08/2016 17:21   Mr Rockwell [place] ZO Contrast  Result Date: 07/08/2016 CLINICAL DATA:  Blurred vision and headache. EXAM: MRI HEAD AND ORBITS WITHOUT AND WITH CONTRAST MR VENOGRAM HEAD WITHOUT CONTRAST TECHNIQUE: Multiplanar, multiecho pulse  sequences of the brain and surrounding structures were obtained without and with intravenous contrast. Multiplanar, multiecho pulse sequences of the orbits and surrounding structures were obtained including fat saturation techniques, before and after intravenous contrast administration. Angiographic images of the intracranial venous structures were obtained using MRV technique without intravenous contrast. CONTRAST:  20mL MULTIHANCE GADOBENATE DIMEGLUMINE 529 MG/ML IV SOLN COMPARISON:  Brain MRI 07/08/2015 FINDINGS: MRI HEAD FINDINGS Brain: There is no evidence of acute infarct, intracranial hemorrhage, mass, midline shift, or extra-axial fluid collection. The ventricles and sulci are normal. The brain is normal in signal. No abnormal enhancement is identified. Vascular: Major intracranial vascular flow voids are preserved. Skull and upper cervical spine: No focal osseous lesion. Other: None. MRI ORBITS FINDINGS Orbits: The globes appear intact within limitations of mild motion and susceptibility artifact. The optic nerves are symmetric and normal in appearance without evidence of abnormal enhancement. The extraocular muscles and lacrimal glands are unremarkable. No  orbital mass or inflammatory change is identified. Optic chiasm is unremarkable. Visualized sinuses: Clear. Soft tissues: Negative. Limited intracranial: Unremarkable. MR VENOGRAM HEAD FINDINGS Superior sagittal sinus, internal cerebral veins, vein of Galen, straight sinus, transverse sinuses, sigmoid sinuses, and jugular bulbs appear patent without evidence of thrombosis. The right transverse and sigmoid sinuses are mildly dominant, and poor visualization of portions of the left transverse and left sigmoid sinuses is felt to be artifactual given their normal appearance on the concurrent conventional brain MRI. IMPRESSION: 1. Unremarkable appearance of the brain and orbits. 2. No evidence of dural venous sinus thrombosis. Electronically Signed   By:  Sebastian AcheAllen  Grady M.D.   On: 07/08/2016 17:21   Dg Fluoro Guide Lumbar Puncture  Result Date: 07/09/2016 CLINICAL DATA: Headache. EXAM: DIAGNOSTIC LUMBAR PUNCTURE UNDER FLUOROSCOPIC GUIDANCE FLUOROSCOPY TIME:  11 seconds. PROCEDURE: Informed consent was obtained from the patient prior to the procedure, including potential complications of headache, allergy, and pain. With the patient prone, the lower back was prepped with Betadine. 1% Lidocaine was used for local anesthesia. Lumbar puncture was performed at the L3-L4 level using a 6 inch 20 gauge needle with return of clear CSF with an opening pressure of 12 cm water. 8.0 ml of CSF were obtained for laboratory studies. The patient tolerated the procedure well and there were no apparent complications. IMPRESSION: Technically successful diagnostic lumbar puncture under fluoroscopy. Clear colorless fluid was obtained. Electronically Signed   By: Elsie StainJohn T Curnes M.D.   On: 07/09/2016 11:11    MAU Course/MDM: I have ordered labs as follows: UA which is clear Imaging ordered: none. CT results reviewed Results reviewed.   Consult Dr Charlotta Newtonzan with presentation, history and exam findings. .   Treatments in MAU included Bentyl and Toradol, which did not help.  Did give her Pyridium which brought her pain from a "9" to "8" (but patient subjectively looks very much improved).    Upon further discussion, primary concern is for her to be diagnosed. Is worried she has cancer or something seriously wrong.    Cannot give Tramadol due to past allergy to it.   Pt stable at time of discharge.  Assessment: Chronic pelvic pain History interstitial cystitis  Plan: Discharge home Recommend See Dr Charlotta Newtonzan today at 1:15 for discussion of possible laparoscopy New referral to [facility] Rx sent for Pyridium for IC   Encouraged to return here or to other Urgent Care/ED if she develops worsening of symptoms, increase in pain, fever, or other concerning symptoms.   Wynelle BourgeoisMarie  Chazz Philson CNM, MSN Certified Nurse-Midwife 07/27/2016 8:44 AM

## 2016-07-27 NOTE — MAU Note (Signed)
Has not had a period for about 8 months due to implant BC; [facility] irregular  & painful [facility] implant in place for about a year;

## 2016-07-27 NOTE — MAU Note (Signed)
C/o N&V since 0200 this AM; c/o intermittent abdominal cramping since yesterday; c/o vaginal bleeding since 0500 this AM; Explanon in place;

## 2016-07-27 NOTE — Discharge Instructions (Signed)
Interstitial Cystitis Interstitial cystitis is a condition that causes inflammation of the bladder. The bladder is a hollow organ in the lower part of your abdomen. It stores urine after the urine is made by your kidneys. With interstitial cystitis, you may have pain in the bladder area. You may also have a frequent and urgent need to urinate. The severity of interstitial cystitis can vary from person to person. You may have flare-ups of the condition, and then it may go away for a while. For many people who have this condition, it becomes a long-term problem. CAUSES The cause of this condition is not known. RISK FACTORS This condition is more likely to develop in women. SYMPTOMS Symptoms of interstitial cystitis vary, and they can change over time. Symptoms may include:  Discomfort or pain in the bladder area. This can range from mild to severe. The pain may change in intensity as the bladder fills with urine or as it empties.  Pelvic pain.  An urgent need to urinate.  Frequent urination.  Pain during sexual intercourse.  Pinpoint bleeding on the bladder wall. For women, the symptoms often get worse during menstruation. DIAGNOSIS This condition is diagnosed by evaluating your symptoms and ruling out other causes. A physical exam will be done. Various tests may be done to rule out other conditions. Common tests include:  Urine tests.  Cystoscopy. In this test, a tool that is like a very thin telescope is used to look into your bladder.  Biopsy. This involves taking a sample of tissue from the bladder wall to be examined under a microscope. TREATMENT There is no cure for interstitial cystitis, but treatment methods are available to control your symptoms. Work closely with your health care provider to find the treatments that will be most effective for you. Treatment options may include:  Medicines to relieve pain and to help reduce the number of times that you feel the need to  urinate.  Bladder training. This involves learning ways to control when you urinate, such as:  Urinating at scheduled times.  Training yourself to delay urination.  Doing exercises (Kegel exercises) to strengthen the muscles that control urine flow.  Lifestyle changes, such as changing your diet or taking steps to control stress.  Use of a device that provides electrical stimulation in order to reduce pain.  A procedure that stretches your bladder by filling it with air or fluid.  Surgery. This is rare. It is only done for extreme cases if other treatments do not help. HOME CARE INSTRUCTIONS  Take medicines only as directed by your health care provider.  Use bladder training techniques as directed.  Keep a bladder diary to find out which foods, liquids, or activities make your symptoms worse.  Use your bladder diary to schedule bathroom trips. If you are away from home, plan to be near a bathroom at each of your scheduled times.  Make sure you urinate just before you leave the house and just before you go to bed.  Do Kegel exercises as directed by your health care provider.  Do not drink alcohol.  Do not use any tobacco products, including cigarettes, chewing tobacco, or electronic cigarettes. If you need help quitting, ask your health care provider.  Make dietary changes as directed by your health care provider. You may need to avoid spicy foods and foods that contain a high amount of potassium.  Limit your drinking of beverages that stimulate urination. These include soda, coffee, and tea.  Keep all follow-up   visits as directed by your health care provider. This is important. SEEK MEDICAL CARE IF:  Your symptoms do not get better after treatment.  Your pain and discomfort are getting worse.  You have more frequent urges to urinate.  You have a fever. SEEK IMMEDIATE MEDICAL CARE IF:  You are not able to control your bladder at all.   This information is not  intended to replace advice given to you by your health care provider. Make sure you discuss any questions you have with your health care provider.   Document Released: 05/04/2004 Document Revised: 09/24/2014 Document Reviewed: 05/11/2014 Elsevier Interactive Patient Education 2016 [employer] Endometriosis Endometriosis is a condition in which the tissue that lines the uterus (endometrium) grows outside of its normal location. The tissue may grow in many locations close to the uterus, but it commonly grows on the ovaries, fallopian tubes, vagina, or bowel. Because the uterus expels, or sheds, its lining every menstrual cycle, there is bleeding wherever the endometrial tissue is located. This can cause pain because blood is irritating to tissues not normally exposed to it.  CAUSES  The cause of endometriosis is not known.  SIGNS AND SYMPTOMS  Often, there are no symptoms. When symptoms are present, they can vary with the location of the displaced tissue. Various symptoms can occur at different times. Although symptoms occur mainly during a woman's menstrual period, they can also occur midcycle and usually stop with menopause. Some people may go months with no symptoms at all. Symptoms may include:   Back or abdominal pain.   Heavier bleeding during periods.   Pain during intercourse.   Painful bowel movements.   Infertility. DIAGNOSIS  Your health care provider will do a physical exam and ask about your symptoms. Various tests may be done, such as:   Blood tests and urine tests. These are done to help rule out other problems.   Ultrasound. This test is done to look for abnormal tissue.   An X-ray of the lower bowel (barium enema).  Laparoscopy. In this procedure, a thin, lighted tube with a tiny camera on the end (laparoscope) is inserted into your abdomen. This helps your health care provider look for abnormal tissue to confirm the diagnosis. The health care provider may also  remove a small piece of tissue (biopsy) from any abnormal tissue found. This tissue sample can then be sent to a lab so it can be looked at under a microscope. TREATMENT  Treatment will vary and may include:   Medicines to relieve pain. Nonsteroidal anti-inflammatory drugs (NSAIDs) are a type of pain medicine that can help to relieve the pain caused by endometriosis.  Hormonal therapy. When using hormonal therapy, periods are eliminated. This eliminates the monthly exposure to blood by the displaced endometrial tissue.   Surgery. Surgery may sometimes be done to remove the abnormal endometrial tissue. In severe cases, surgery may be done to remove the fallopian tubes, uterus, and ovaries (hysterectomy). HOME CARE INSTRUCTIONS   Take all medicines as directed by your health care provider. Do not take aspirin because it may increase bleeding when you are not on hormonal therapy.   Avoid activities that produce pain, including sexual activity. SEEK MEDICAL CARE IF:  You have pelvic pain before, after, or during your periods.  You have pelvic pain between periods that gets worse during your period.  You have pelvic pain during or after sex.  You have pelvic pain with bowel movements or urination, especially during your period.    You have problems getting pregnant.  You have a fever. SEEK IMMEDIATE MEDICAL CARE IF:   Your pain is severe and is not responding to pain medicine.   You have severe nausea and vomiting, or you cannot keep foods down.   You have pain that is limited to the right lower part of your abdomen.   You have swelling or increasing pain in your abdomen.   You see blood in your stool.  MAKE SURE YOU:   Understand these instructions.  Will watch your condition.  Will get help right away if you are not doing well or get worse.   This information is not intended to replace advice given to you by your health care provider. Make sure you discuss any  questions you have with your health care provider.   Document Released: 08/31/2000 Document Revised: 09/24/2014 Document Reviewed: 05/01/2013 Elsevier Interactive Patient Education 2016 [employer]vier Inc[employer]  

## 2016-07-28 ENCOUNTER — Emergency Department (HOSPITAL_COMMUNITY)
Admission: EM | Admit: 2016-07-28 | Discharge: 2016-07-28 | Disposition: A | Discharge status: Home / Self Care | Payer: Medicaid Other | Attending: Emergency Medicine | Admitting: Emergency Medicine

## 2016-07-28 ENCOUNTER — Encounter (HOSPITAL_COMMUNITY): Payer: Self-pay | Admitting: Emergency Medicine

## 2016-07-28 DIAGNOSIS — Z91040 Latex allergy status: Secondary | ICD-10-CM | POA: Insufficient documentation

## 2016-07-28 DIAGNOSIS — I252 Old myocardial infarction: Secondary | ICD-10-CM | POA: Insufficient documentation

## 2016-07-28 DIAGNOSIS — G8929 Other chronic pain: Secondary | ICD-10-CM | POA: Insufficient documentation

## 2016-07-28 DIAGNOSIS — E119 Type 2 diabetes mellitus without complications: Secondary | ICD-10-CM | POA: Insufficient documentation

## 2016-07-28 DIAGNOSIS — Z79899 Other long term (current) drug therapy: Secondary | ICD-10-CM | POA: Insufficient documentation

## 2016-07-28 DIAGNOSIS — F17290 Nicotine dependence, other tobacco product, uncomplicated: Secondary | ICD-10-CM | POA: Insufficient documentation

## 2016-07-28 DIAGNOSIS — R102 Pelvic and perineal pain: Secondary | ICD-10-CM | POA: Insufficient documentation

## 2016-07-28 LAB — URINALYSIS, ROUTINE W REFLEX MICROSCOPIC
BILIRUBIN URINE: NEGATIVE
Glucose, UA: NEGATIVE mg/dL
Ketones, ur: NEGATIVE mg/dL
LEUKOCYTES UA: NEGATIVE
NITRITE: NEGATIVE
PH: 6.5 (ref 5.0–8.0)
Protein, ur: NEGATIVE mg/dL
SPECIFIC GRAVITY, URINE: 1.025 (ref 1.005–1.030)

## 2016-07-28 LAB — CBC WITH DIFFERENTIAL/PLATELET
BASOS PCT: 0 %
Basophils Absolute: 0 10*3/uL (ref 0.0–0.1)
EOS PCT: 1 %
Eosinophils Absolute: 0.1 10*3/uL (ref 0.0–0.7)
HEMATOCRIT: 36 % (ref 36.0–46.0)
Hemoglobin: 11.6 g/dL — ABNORMAL LOW (ref 12.0–15.0)
LYMPHS ABS: 2.8 10*3/uL (ref 0.7–4.0)
Lymphocytes Relative: 27 %
MCH: 23.8 pg — AB (ref 26.0–34.0)
MCHC: 32.2 g/dL (ref 30.0–36.0)
MCV: 73.8 fL — AB (ref 78.0–100.0)
MONO ABS: 0.8 10*3/uL (ref 0.1–1.0)
MONOS PCT: 8 %
NEUTROS PCT: 64 %
Neutro Abs: 6.5 10*3/uL (ref 1.7–7.7)
PLATELETS: 295 10*3/uL (ref 150–400)
RBC: 4.88 MIL/uL (ref 3.87–5.11)
RDW: 14.9 % (ref 11.5–15.5)
WBC: 10.2 10*3/uL (ref 4.0–10.5)

## 2016-07-28 LAB — I-STAT BETA HCG BLOOD, ED (MC, WL, AP ONLY): I-stat hCG, quantitative: <5 m[IU]/mL (ref <5)

## 2016-07-28 LAB — URINE MICROSCOPIC-ADD ON

## 2016-07-28 LAB — BASIC METABOLIC PANEL
Anion gap: 6 (ref 5–15)
BUN: 11 mg/dL (ref 6–20)
CHLORIDE: 106 mmol/L (ref 101–111)
CO2: 28 mmol/L (ref 22–32)
CREATININE: 0.67 mg/dL (ref 0.44–1.00)
Calcium: 9 mg/dL (ref 8.9–10.3)
GFR calc non Af Amer: >60 mL/min (ref >60)
Glucose, Bld: 107 mg/dL — ABNORMAL HIGH (ref 65–99)
POTASSIUM: 3.9 mmol/L (ref 3.5–5.1)
SODIUM: 140 mmol/L (ref 135–145)

## 2016-07-28 MED ORDER — PROMETHAZINE HCL 25 MG PO TABS: 25.0000 mg | ORAL_TABLET | Freq: Four times a day (QID) | ORAL | 0 refills | Status: DC | PRN

## 2016-07-28 MED ORDER — METOCLOPRAMIDE HCL 5 MG/ML IJ SOLN
10.0000 mg | INTRAMUSCULAR | Status: AC
  Administered 2016-07-28: 10 mg via INTRAVENOUS
  Filled 2016-07-28: qty 2

## 2016-07-28 MED ORDER — KETOROLAC TROMETHAMINE 30 MG/ML IJ SOLN
30.0000 mg | Freq: Once | INTRAMUSCULAR | Status: AC
  Administered 2016-07-28: 30 mg via INTRAVENOUS
  Filled 2016-07-28: qty 1

## 2016-07-28 MED ORDER — SODIUM CHLORIDE 0.9 % IV BOLUS (SEPSIS)
1000.0000 mL | Freq: Once | INTRAVENOUS | Status: AC
  Administered 2016-07-28: 1000 mL via INTRAVENOUS

## 2016-07-28 MED ORDER — DICYCLOMINE HCL 10 MG/ML IM SOLN
20.0000 mg | Freq: Once | INTRAMUSCULAR | Status: AC
  Administered 2016-07-28: 20 mg via INTRAMUSCULAR
  Filled 2016-07-28: qty 2

## 2016-07-28 MED ORDER — OXYCODONE-ACETAMINOPHEN 5-325 MG PO TABS
2.0000 | ORAL_TABLET | Freq: Once | ORAL | Status: AC
  Administered 2016-07-28: 2 via ORAL
  Filled 2016-07-28: qty 2

## 2016-07-28 MED ORDER — DICYCLOMINE HCL 20 MG PO TABS: 20.0000 mg | ORAL_TABLET | Freq: Two times a day (BID) | ORAL | 0 refills | Status: DC | PRN

## 2016-07-28 MED ORDER — NAPROXEN 500 MG PO TABS: 500.0000 mg | ORAL_TABLET | Freq: Two times a day (BID) | ORAL | 0 refills | Status: DC

## 2016-07-28 NOTE — ED Triage Notes (Signed)
Pt comes from home with complaints of severe lower abdominal pain.  States she was seen at her OBGYN today and was referred to a pain clinic since she has been dealing with this since she was [id number].  Has appointment on November 30th to talk about endometriosis surgery.  Has had implanon implant for about a year.  Without periods for 8 months.  Endorses nausea and vomiting.  Tearful during triage.

## 2016-07-28 NOTE — ED Provider Notes (Signed)
[facility]-EMERGENCY DEPT Provider Note   CSN: [id number] Arrival date & time: 07/28/16  0206    History   Chief Complaint Chief Complaint  Patient presents with  . Abdominal Pain    HPI Ashlee Thompson is a 20 y.o. female.   20 year old female with a history of anxiety, diabetes mellitus presents to the emergency department for evaluation of suprapubic abdominal pain. Patient states that her pain began yesterday. It has been intermittent and waxing and waning in severity. She states that it is largely nonradiating, though she has felt some discomfort in her back at times as well. She reports seeing her OB/GYN today for further evaluation of her symptoms. She has found that, lately, her pain has been present with vaginal bleeding. She was given some medication to take by her doctor, but does not believe that it is helping her. She was also referred to a pain clinic as she has been having similar pain since the age of 88. Patient complaining of nausea and sporadic vomiting. She denies any hematemesis. She has had normal bowel movements in the last 24 hours. No dysuria or hematuria. No recent fevers. No history of abdominal surgeries.     Past Medical History:  Diagnosis Date  . Anxiety   . Diabetes mellitus without complication (HCC)   . GI bleed due to NSAIDs   . Myocardial infarction     Patient Active Problem List   Diagnosis Date Noted  . Headache   . UGI bleed 07/07/2016  . History of diabetes mellitus 07/07/2016  . Morbid obesity (HCC) 07/07/2016  . Hematemesis with nausea 07/07/2016  . GI bleed 07/07/2016    Past Surgical History:  Procedure Laterality Date  . ESOPHAGOGASTRODUODENOSCOPY N/A 07/07/2016   Procedure: ESOPHAGOGASTRODUODENOSCOPY (EGD);  Surgeon: Ruffin Frederick, MD;  Location: [facility] ENDOSCOPY;  Service: Gastroenterology;  Laterality: N/A;    OB History    Gravida Para Term Preterm AB Living   0 0 0 0 0 0   SAB TAB Ectopic Multiple Live Births   0 0 0  0 0       Home Medications    Prior to Admission medications   Medication Sig Start Date End Date Taking? Authorizing Provider  acetaminophen (TYLENOL) 325 MG tablet Take 2 tablets (650 mg total) by mouth every 6 (six) hours as needed for moderate pain (headache). Patient not taking: Reported on 07/17/2016 07/09/16   Maretta Bees, MD  dicyclomine (BENTYL) 20 MG tablet Take 1 tablet (20 mg total) by mouth 2 (two) times daily as needed for spasms. For abdominal pain/cramping 07/28/16   Antony Madura, PA-C  naproxen (NAPROSYN) 500 MG tablet Take 1 tablet (500 mg total) by mouth 2 (two) times daily. 07/28/16   Antony Madura, PA-C  ondansetron (ZOFRAN ODT) 4 MG disintegrating tablet Take 1 tablet (4 mg total) by mouth every 8 (eight) hours as needed for nausea or vomiting. Patient not taking: Reported on 07/17/2016 07/08/16   Maretta Bees, MD  pantoprazole (PROTONIX) 40 MG tablet Take 1 tablet (40 mg total) by mouth daily at 12 noon. Patient not taking: Reported on 07/17/2016 07/09/16   Maretta Bees, MD  phenazopyridine (PYRIDIUM) 200 MG tablet Take 1 tablet (200 mg total) by mouth 3 (three) times daily as needed for pain (urethral spasm). 07/27/16   Aviva Signs, CNM  promethazine (PHENERGAN) 25 MG tablet Take 1 tablet (25 mg total) by mouth every 6 (six) hours as needed for nausea or vomiting. 07/28/16  Antony MaduraKelly Snyder Colavito, PA-C    Family History Family History  Problem Relation Age of Onset  . Hyperlipidemia Mother   . Mental illness Mother   . Hyperlipidemia Father   . Mental illness Brother   . Heart disease Paternal Grandfather   . Hyperlipidemia Paternal Grandfather   . Hypertension Paternal Grandfather   . CAD Other   . Cancer Other     Social History Social History  Substance Use Topics  . Smoking status: Current Some Day Smoker    Packs/day: 0.25    Types: Cigars  . Smokeless tobacco: Current User  . Alcohol use Yes     Comment: social     Allergies     Tramadol and Latex   Review of Systems Review of Systems Ten systems reviewed and are negative for acute change, except as noted in the HPI.    Physical Exam Updated Vital Signs BP 108/68   Pulse 72   Temp 98.6 F (37 C) (Oral)   Resp 25   Ht 5' 5.5" (1.664 m)   Wt 136.1 kg   SpO2 99%   BMI 49.16 kg/m   Physical Exam  Constitutional: She is oriented to person, place, and time. She appears well-developed and well-nourished. No distress.  Nontoxic/nonseptic appearing  HENT:  Head: Normocephalic and atraumatic.  Eyes: Conjunctivae and EOM are normal. No scleral icterus.  Neck: Normal range of motion.  Cardiovascular: Normal rate, regular rhythm and intact distal pulses.   Pulmonary/Chest: Effort normal. No respiratory distress.  Respirations even and unlabored  Abdominal: Soft. She exhibits no distension. There is tenderness. There is no guarding.  Soft, morbidly obese abdomen with suprapubic TTP. No masses. No involuntary guarding or peritoneal signs.  Musculoskeletal: Normal range of motion.  Neurological: She is alert and oriented to person, place, and time. She exhibits normal muscle tone. Coordination normal.  GCS 15. Speech is goal oriented. Patient moving all extremities.  Skin: Skin is warm and dry. No rash noted. She is not diaphoretic. No erythema. No pallor.  Psychiatric: She has a normal mood and affect. Her behavior is normal.  Nursing note and vitals reviewed.    ED Treatments / Results  Labs (all labs ordered are listed, but only abnormal results are displayed) Labs Reviewed  CBC WITH DIFFERENTIAL/PLATELET - Abnormal; Notable for the following:       Result Value   Hemoglobin 11.6 (*)    MCV 73.8 (*)    MCH 23.8 (*)    All other components within normal limits  BASIC METABOLIC PANEL - Abnormal; Notable for the following:    Glucose, Bld 107 (*)    All other components within normal limits  URINALYSIS, ROUTINE W REFLEX MICROSCOPIC (NOT [facility]) -  Abnormal; Notable for the following:    APPearance CLOUDY (*)    Hgb urine dipstick LARGE (*)    All other components within normal limits  URINE MICROSCOPIC-ADD ON - Abnormal; Notable for the following:    Squamous Epithelial / LPF 0-5 (*)    Bacteria, UA RARE (*)    All other components within normal limits  I-STAT BETA HCG BLOOD, ED ([facility], [facility], [facility] ONLY)    EKG  EKG Interpretation None       Radiology No results found.  Procedures Procedures (including critical care time)  Medications Ordered in ED Medications  ketorolac (TORADOL) 30 MG/ML injection 30 mg (30 mg Intravenous Given 07/28/16 0339)  dicyclomine (BENTYL) injection 20 mg (20 mg Intramuscular Given 07/28/16  0330)  sodium chloride 0.9 % bolus 1,000 mL (0 mLs Intravenous Stopped 07/28/16 0440)  metoCLOPramide (REGLAN) injection 10 mg (10 mg Intravenous Given 07/28/16 0338)  oxyCODONE-acetaminophen (PERCOCET/ROXICET) 5-325 MG per tablet 2 tablet (2 tablets Oral Given 07/28/16 0457)     Initial Impression / Assessment and Plan / ED Course  I have reviewed the triage vital signs and the nursing notes.  Pertinent labs & imaging results that were available during my care of the patient were reviewed by me and considered in my medical decision making (see chart for details).  Clinical Course     4:51 AM Patient reassessed. Pain is reported at 5/10 from 8-9/10. She is resting comfortably. No c/o nausea. Labs c/w baseline. Symptoms likely secondary to acute exacerbation of known chronic pelvic pain. Patient requesting additional medication. Will add 2 tablets of percocet. Anticipate discharge if further improvement in pain and patient able to tolerate POs.  5:40 AM Patient with further improvement in pain following PO percocet. Plan to d/c with Bentyl as NaproxenAs patient had improvement with a similar regimen while in the emergency department. She has been recommended to see her OB/GYN for follow-up. Return precautions  discussed and provided. Patient discharged in stable condition.   Vitals:   07/28/16 0216 07/28/16 0217 07/28/16 0453 07/28/16 0530  BP: 143/96   108/68  Pulse: 86   72  Resp: 25     Temp: 97.9 F (36.6 C)  98.6 F (37 C)   TempSrc: Oral  Oral   SpO2: 98%   99%  Weight:  136.1 kg    Height:  5' 5.5" (1.664 m)      Final Clinical Impressions(s) / ED Diagnoses   Final diagnoses:  Chronic pelvic pain in female    New Prescriptions New Prescriptions   DICYCLOMINE (BENTYL) 20 MG TABLET    Take 1 tablet (20 mg total) by mouth 2 (two) times daily as needed for spasms. For abdominal pain/cramping   NAPROXEN (NAPROSYN) 500 MG TABLET    Take 1 tablet (500 mg total) by mouth 2 (two) times daily.   PROMETHAZINE (PHENERGAN) 25 MG TABLET    Take 1 tablet (25 mg total) by mouth every 6 (six) hours as needed for nausea or vomiting.     Antony MaduraKelly Hansen Carino, PA-C 07/28/16 0540    Dione Boozeavid Glick, MD 07/28/16 [phone]

## 2016-08-02 ENCOUNTER — Ambulatory Visit: Payer: MEDICAID | Admitting: Physical Therapy

## 2016-10-30 ENCOUNTER — Emergency Department (HOSPITAL_COMMUNITY)
Admission: EM | Admit: 2016-10-30 | Discharge: 2016-10-30 | Disposition: A | Discharge status: Home / Self Care | Payer: BLUE CROSS/BLUE SHIELD | Attending: Emergency Medicine | Admitting: Emergency Medicine

## 2016-10-30 ENCOUNTER — Encounter (HOSPITAL_COMMUNITY): Payer: Self-pay

## 2016-10-30 ENCOUNTER — Emergency Department (HOSPITAL_COMMUNITY): Payer: BLUE CROSS/BLUE SHIELD

## 2016-10-30 DIAGNOSIS — E119 Type 2 diabetes mellitus without complications: Secondary | ICD-10-CM | POA: Diagnosis not present

## 2016-10-30 DIAGNOSIS — R112 Nausea with vomiting, unspecified: Secondary | ICD-10-CM | POA: Diagnosis not present

## 2016-10-30 DIAGNOSIS — R1030 Lower abdominal pain, unspecified: Secondary | ICD-10-CM | POA: Insufficient documentation

## 2016-10-30 DIAGNOSIS — I252 Old myocardial infarction: Secondary | ICD-10-CM | POA: Insufficient documentation

## 2016-10-30 DIAGNOSIS — F17290 Nicotine dependence, other tobacco product, uncomplicated: Secondary | ICD-10-CM | POA: Diagnosis not present

## 2016-10-30 DIAGNOSIS — Z91040 Latex allergy status: Secondary | ICD-10-CM | POA: Diagnosis not present

## 2016-10-30 HISTORY — DX: Unspecified ovarian cyst, left side: N83.202

## 2016-10-30 HISTORY — DX: Unspecified ovarian cyst, right side: N83.201

## 2016-10-30 LAB — URINALYSIS, ROUTINE W REFLEX MICROSCOPIC
BACTERIA UA: NONE SEEN
Bilirubin Urine: NEGATIVE
GLUCOSE, UA: NEGATIVE mg/dL
KETONES UR: 80 mg/dL — AB
Leukocytes, UA: NEGATIVE
Nitrite: NEGATIVE
PROTEIN: NEGATIVE mg/dL
Specific Gravity, Urine: 1.025 (ref 1.005–1.030)
pH: 6 (ref 5.0–8.0)

## 2016-10-30 LAB — COMPREHENSIVE METABOLIC PANEL
ALBUMIN: 4 g/dL (ref 3.5–5.0)
ALK PHOS: 102 U/L (ref 38–126)
ALT: 19 U/L (ref 14–54)
ANION GAP: 6 (ref 5–15)
AST: 21 U/L (ref 15–41)
BUN: 9 mg/dL (ref 6–20)
CHLORIDE: 106 mmol/L (ref 101–111)
CO2: 26 mmol/L (ref 22–32)
Calcium: 9.2 mg/dL (ref 8.9–10.3)
Creatinine, Ser: 0.61 mg/dL (ref 0.44–1.00)
GFR calc non Af Amer: >60 mL/min (ref >60)
GLUCOSE: 91 mg/dL (ref 65–99)
POTASSIUM: 3.6 mmol/L (ref 3.5–5.1)
SODIUM: 138 mmol/L (ref 135–145)
Total Bilirubin: 0.7 mg/dL (ref 0.3–1.2)
Total Protein: 7.5 g/dL (ref 6.5–8.1)

## 2016-10-30 LAB — PREGNANCY, URINE: PREG TEST UR: NEGATIVE

## 2016-10-30 LAB — CBC
HEMATOCRIT: 39.7 % (ref 36.0–46.0)
HEMOGLOBIN: 12.8 g/dL (ref 12.0–15.0)
MCH: 23.8 pg — AB (ref 26.0–34.0)
MCHC: 32.2 g/dL (ref 30.0–36.0)
MCV: 73.8 fL — AB (ref 78.0–100.0)
Platelets: 303 10*3/uL (ref 150–400)
RBC: 5.38 MIL/uL — AB (ref 3.87–5.11)
RDW: 14.9 % (ref 11.5–15.5)
WBC: 9.5 10*3/uL (ref 4.0–10.5)

## 2016-10-30 LAB — WET PREP, GENITAL
Clue Cells Wet Prep HPF POC: NONE SEEN
Sperm: NONE SEEN
TRICH WET PREP: NONE SEEN
Yeast Wet Prep HPF POC: NONE SEEN

## 2016-10-30 LAB — LIPASE, BLOOD: LIPASE: 24 U/L (ref 11–51)

## 2016-10-30 MED ORDER — DICYCLOMINE HCL 10 MG PO CAPS
10.0000 mg | ORAL_CAPSULE | Freq: Once | ORAL | Status: AC
Start: 2016-10-30 — End: 2016-10-30
  Administered 2016-10-30: 10 mg via ORAL
  Filled 2016-10-30: qty 1

## 2016-10-30 MED ORDER — ONDANSETRON 4 MG PO TBDP
4.0000 mg | ORAL_TABLET | Freq: Once | ORAL | Status: AC | PRN
  Administered 2016-10-30: 4 mg via ORAL
  Filled 2016-10-30: qty 1

## 2016-10-30 MED ORDER — SODIUM CHLORIDE 0.9 % IV BOLUS (SEPSIS)
1000.0000 mL | Freq: Once | INTRAVENOUS | Status: AC
  Administered 2016-10-30: 1000 mL via INTRAVENOUS

## 2016-10-30 MED ORDER — HYDROCODONE-ACETAMINOPHEN 5-325 MG PO TABS
1.0000 | ORAL_TABLET | Freq: Once | ORAL | Status: AC
  Administered 2016-10-30: 1 via ORAL
  Filled 2016-10-30: qty 1

## 2016-10-30 MED ORDER — FENTANYL CITRATE (PF) 100 MCG/2ML IJ SOLN
100.0000 ug | Freq: Once | INTRAMUSCULAR | Status: AC
  Administered 2016-10-30: 100 ug via INTRAVENOUS
  Filled 2016-10-30: qty 2

## 2016-10-30 MED ORDER — DICYCLOMINE HCL 20 MG PO TABS: 20.0000 mg | ORAL_TABLET | Freq: Two times a day (BID) | ORAL | 0 refills | Status: DC

## 2016-10-30 MED ORDER — ACETAMINOPHEN 325 MG PO TABS: 650.0000 mg | ORAL_TABLET | Freq: Four times a day (QID) | ORAL | 0 refills | Status: DC | PRN

## 2016-10-30 MED ORDER — ONDANSETRON 4 MG PO TBDP: 4.0000 mg | ORAL_TABLET | Freq: Three times a day (TID) | ORAL | 0 refills | Status: DC | PRN

## 2016-10-30 MED ORDER — ONDANSETRON HCL 4 MG/2ML IJ SOLN
4.0000 mg | Freq: Once | INTRAMUSCULAR | Status: AC
  Administered 2016-10-30: 4 mg via INTRAVENOUS
  Filled 2016-10-30: qty 2

## 2016-10-30 NOTE — ED Provider Notes (Signed)
[facility]-EMERGENCY DEPT Provider Note   CSN: [id number] Arrival date & time: 10/30/16  1251     History   Chief Complaint Chief Complaint  Patient presents with  . Abdominal Pain    HPI Ashlee Thompson is a 21 y.o. Thompson.  Ashlee Thompson is a 21 y.o. Thompson who presents to the ED complaining of lower abdominal pain starting three days ago. She complains of pain to her suprapubic area that has been ongoing for the past three days. She reports her pain is right in the middle of her lower abdomen, it is not worse on her right or left side. She reports this feels similar to when she had a ovarian cyst. She reports a sudden burst of pain yesterday, that felt like an ovarian cyst rupture. She reports three episodes of vomiting today. No diarrhea. No previous abdominal surgeries. She has taken nothing for treatment of her symptoms today. She has Nexplanon for birth control.  Patient denies fevers, hematemesis, vaginal discharge, urinary symptoms, or rashes.    The history is provided by the patient and medical records. No language interpreter was used.  Abdominal Pain   Associated symptoms include nausea and vomiting. Pertinent negatives include fever, diarrhea, dysuria, frequency and headaches.    Past Medical History:  Diagnosis Date  . Anxiety   . Bilateral ovarian cysts   . Diabetes mellitus without complication (HCC)   . GI bleed due to NSAIDs   . Myocardial infarction     Patient Active Problem List   Diagnosis Date Noted  . Headache   . UGI bleed 07/07/2016  . History of diabetes mellitus 07/07/2016  . Morbid obesity (HCC) 07/07/2016  . Hematemesis with nausea 07/07/2016  . GI bleed 07/07/2016    Past Surgical History:  Procedure Laterality Date  . ESOPHAGOGASTRODUODENOSCOPY N/A 07/07/2016   Procedure: ESOPHAGOGASTRODUODENOSCOPY (EGD);  Surgeon: Ruffin Frederick, MD;  Location: [facility] ENDOSCOPY;  Service: Gastroenterology;  Laterality: N/A;    OB History    Gravida  Para Term Preterm AB Living   0 0 0 0 0 0   SAB TAB Ectopic Multiple Live Births   0 0 0 0 0       Home Medications    Prior to Admission medications   Medication Sig Start Date End Date Taking? Authorizing Provider  FLUoxetine (PROZAC) 10 MG capsule Take 10 mg by mouth daily.   Yes Historical Provider, MD  acetaminophen (TYLENOL) 325 MG tablet Take 2 tablets (650 mg total) by mouth every 6 (six) hours as needed for mild pain or moderate pain. 10/30/16   Everlene Farrier, PA-C  dicyclomine (BENTYL) 20 MG tablet Take 1 tablet (20 mg total) by mouth 2 (two) times daily. 10/30/16   Everlene Farrier, PA-C  ondansetron (ZOFRAN ODT) 4 MG disintegrating tablet Take 1 tablet (4 mg total) by mouth every 8 (eight) hours as needed for nausea or vomiting. 10/30/16   Everlene Farrier, PA-C  pantoprazole (PROTONIX) 40 MG tablet Take 1 tablet (40 mg total) by mouth daily at 12 noon. Patient not taking: Reported on 07/17/2016 10/Ashlee/17   Maretta Bees, MD  phenazopyridine (PYRIDIUM) 200 MG tablet Take 1 tablet (200 mg total) by mouth 3 (three) times daily as needed for pain (urethral spasm). Patient not taking: Reported on 10/30/2016 07/27/16   Aviva Signs, CNM  promethazine (PHENERGAN) 25 MG tablet Take 1 tablet (25 mg total) by mouth every 6 (six) hours as needed for nausea or vomiting. Patient not taking: Reported on  10/30/2016 07/28/16   Antony MaduraKelly Humes, PA-C    Family History Family History  Problem Relation Age of Onset  . Hyperlipidemia Mother   . Mental illness Mother   . Hyperlipidemia Father   . Mental illness Brother   . Heart disease Paternal Grandfather   . Hyperlipidemia Paternal Grandfather   . Hypertension Paternal Grandfather   . CAD Other   . Cancer Other     Social History Social History  Substance Use Topics  . Smoking status: Current Some Day Smoker    Packs/day: 0.25    Types: Cigars  . Smokeless tobacco: Never Used  . Alcohol use Yes     Comment: social      Allergies   Tramadol and Latex   Review of Systems Review of Systems  Constitutional: Negative for chills and fever.  HENT: Negative for congestion and sore throat.   Eyes: Negative for visual disturbance.  Respiratory: Negative for cough and shortness of breath.   Cardiovascular: Negative for chest pain.  Gastrointestinal: Positive for abdominal pain, nausea and vomiting. Negative for blood in stool and diarrhea.  Genitourinary: Positive for vaginal bleeding. Negative for difficulty urinating, dysuria, frequency, urgency and vaginal discharge.  Musculoskeletal: Negative for back pain and neck pain.  Skin: Negative for rash.  Neurological: Negative for headaches.     Physical Exam Updated Vital Signs BP 131/87 (BP Location: Right Arm)   Pulse 91   Resp 17   Ht 5' 5.5" (1.664 m)   Wt 135.6 kg   SpO2 100%   BMI 49.00 kg/m   Physical Exam  Constitutional: She appears well-developed and well-nourished. No distress.  Nontoxic appearing.  HENT:  Head: Normocephalic and atraumatic.  Mouth/Throat: Oropharynx is clear and moist.  Eyes: Conjunctivae are normal. Pupils are equal, round, and reactive to light. Right eye exhibits no discharge. Left eye exhibits no discharge.  Neck: Neck supple.  Cardiovascular: Normal rate, regular rhythm, normal heart sounds and intact distal pulses.  Exam reveals no gallop and no friction rub.   No murmur heard. Pulmonary/Chest: Effort normal and breath sounds normal. No respiratory distress. She has no wheezes. She has no rales.  Abdominal: Soft. Bowel sounds are normal. She exhibits no distension and no mass. There is tenderness. There is no rebound and no guarding.  Abdomen is soft. Bowel sounds are present. Patient has suprapubic abdominal tenderness to palpation. No rebound tenderness. No CVA or flank tenderness. No peritoneal signs.  Genitourinary:  Genitourinary Comments: Pelvic exam performed by me with Thompson nurse tech chaperone.  Small amount of blood in her vaginal vault. Cervix is closed. No adnexal tenderness or fullness. Mild suprapubic tenderness to palpation. No cervical motion tenderness.  Musculoskeletal: She exhibits no edema.  Lymphadenopathy:    She has no cervical adenopathy.  Neurological: She is alert. Coordination normal.  Skin: Skin is warm and dry. Capillary refill takes less than 2 seconds. No rash noted. She is not diaphoretic. No erythema. No pallor.  Psychiatric: She has a normal mood and affect. Her behavior is normal.  Nursing note and vitals reviewed.    ED Treatments / Results  Labs (all labs ordered are listed, but only abnormal results are displayed) Labs Reviewed  WET PREP, GENITAL - Abnormal; Notable for the following:       Result Value   WBC, Wet Prep HPF POC FEW (*)    All other components within normal limits  CBC - Abnormal; Notable for the following:    RBC 5.38 (*)  MCV 73.8 (*)    MCH Ashlee.8 (*)    All other components within normal limits  URINALYSIS, ROUTINE W REFLEX MICROSCOPIC - Abnormal; Notable for the following:    Hgb urine dipstick LARGE (*)    Ketones, ur 80 (*)    Squamous Epithelial / LPF 0-5 (*)    All other components within normal limits  LIPASE, BLOOD  COMPREHENSIVE METABOLIC PANEL  PREGNANCY, URINE  GC/CHLAMYDIA PROBE AMP ([place]) NOT AT [facility]    EKG  EKG Interpretation None       Radiology US Transvaginal Non-ob  Result Date: 10/30/2016 CLINICAL DATA:  Pelvic pain for 3 days, nausea, vomiting. Rule out torsion. EXAM: TRANSABDOMINAL AND TRANSVAGINAL ULTRASOUND OF PELVIS DOPPLER ULTRASOUND OF OVARIES TECHNIQUE: Both transabdominal and transvaginal ultrasound examinations of the pelvis were performed. Transabdominal technique was performed for global imaging of the pelvis including uterus, ovaries, adnexal regions, and pelvic cul-de-sac. It was necessary to proceed with endovaginal exam following the transabdominal exam to visualize the  uterus, endometrium, ovaries and adnexa . Color and duplex Doppler ultrasound was utilized to evaluate blood flow to the ovaries. COMPARISON:  07/17/2016 FINDINGS: Uterus Measurements: 6.5 x 3.4 x 4.8 cm. No fibroids or other mass visualized. Endometrium Thickness: 6 mm in thickness.  No focal abnormality visualized. Right ovary Measurements: 3.2 x 1.1 x 1.4 cm. Normal appearance/no adnexal mass. Left ovary Measurements: 3.6 x 2.4 x 2.4 cm. 2.5 cm dominant follicle. No adnexal mass. Pulsed Doppler evaluation of both ovaries demonstrates normal low-resistance arterial and venous waveforms. Other findings No abnormal free fluid. IMPRESSION: No evidence of torsion.  No acute findings. Electronically Signed   By: Charlett Nose M.D.   On: 10/30/2016 16:34   US Pelvis Complete  Result Date: 10/30/2016 CLINICAL DATA:  Pelvic pain for 3 days, nausea, vomiting. Rule out torsion. EXAM: TRANSABDOMINAL AND TRANSVAGINAL ULTRASOUND OF PELVIS DOPPLER ULTRASOUND OF OVARIES TECHNIQUE: Both transabdominal and transvaginal ultrasound examinations of the pelvis were performed. Transabdominal technique was performed for global imaging of the pelvis including uterus, ovaries, adnexal regions, and pelvic cul-de-sac. It was necessary to proceed with endovaginal exam following the transabdominal exam to visualize the uterus, endometrium, ovaries and adnexa . Color and duplex Doppler ultrasound was utilized to evaluate blood flow to the ovaries. COMPARISON:  07/17/2016 FINDINGS: Uterus Measurements: 6.5 x 3.4 x 4.8 cm. No fibroids or other mass visualized. Endometrium Thickness: 6 mm in thickness.  No focal abnormality visualized. Right ovary Measurements: 3.2 x 1.1 x 1.4 cm. Normal appearance/no adnexal mass. Left ovary Measurements: 3.6 x 2.4 x 2.4 cm. 2.5 cm dominant follicle. No adnexal mass. Pulsed Doppler evaluation of both ovaries demonstrates normal low-resistance arterial and venous waveforms. Other findings No abnormal free  fluid. IMPRESSION: No evidence of torsion.  No acute findings. Electronically Signed   By: Charlett Nose M.D.   On: 10/30/2016 16:34   [place] Art/ven Flow Abd Pelv Doppler  Result Date: 10/30/2016 CLINICAL DATA:  Pelvic pain for 3 days, nausea, vomiting. Rule out torsion. EXAM: TRANSABDOMINAL AND TRANSVAGINAL ULTRASOUND OF PELVIS DOPPLER ULTRASOUND OF OVARIES TECHNIQUE: Both transabdominal and transvaginal ultrasound examinations of the pelvis were performed. Transabdominal technique was performed for global imaging of the pelvis including uterus, ovaries, adnexal regions, and pelvic cul-de-sac. It was necessary to proceed with endovaginal exam following the transabdominal exam to visualize the uterus, endometrium, ovaries and adnexa . Color and duplex Doppler ultrasound was utilized to evaluate blood flow to the ovaries. COMPARISON:  07/17/2016 FINDINGS: Uterus Measurements:  6.5 x 3.4 x 4.8 cm. No fibroids or other mass visualized. Endometrium Thickness: 6 mm in thickness.  No focal abnormality visualized. Right ovary Measurements: 3.2 x 1.1 x 1.4 cm. Normal appearance/no adnexal mass. Left ovary Measurements: 3.6 x 2.4 x 2.4 cm. 2.5 cm dominant follicle. No adnexal mass. Pulsed Doppler evaluation of both ovaries demonstrates normal low-resistance arterial and venous waveforms. Other findings No abnormal free fluid. IMPRESSION: No evidence of torsion.  No acute findings. Electronically Signed   By: Charlett Nose M.D.   On: 10/30/2016 16:34    Procedures Procedures (including critical care time)  Medications Ordered in ED Medications  ondansetron (ZOFRAN-ODT) disintegrating tablet 4 mg (4 mg Oral Given 10/30/16 1304)  sodium chloride 0.9 % bolus 1,000 mL (0 mLs Intravenous Stopped 10/30/16 1819)  ondansetron (ZOFRAN) injection 4 mg (4 mg Intravenous Given 10/30/16 1602)  fentaNYL (SUBLIMAZE) injection 100 mcg (100 mcg Intravenous Given 10/30/16 1602)  HYDROcodone-acetaminophen (NORCO/VICODIN) 5-325 MG per  tablet 1 tablet (1 tablet Oral Given 10/30/16 1818)  dicyclomine (BENTYL) capsule 10 mg (10 mg Oral Given 10/30/16 1818)     Initial Impression / Assessment and Plan / ED Course  I have reviewed the triage vital signs and the nursing notes.  Pertinent labs & imaging results that were available during my care of the patient were reviewed by me and considered in my medical decision making (see chart for details).    This  is a Ashlee Thompson who presents to the ED complaining of lower abdominal pain starting three days ago. She complains of pain to her suprapubic area that has been ongoing for the past three days. She reports her pain is right in the middle of her lower abdomen, it is not worse on her right or left side. She reports this feels similar to when she had a ovarian cyst. She reports a sudden burst of pain yesterday, that felt like an ovarian cyst rupture. She reports three episodes of vomiting today. No diarrhea. No previous abdominal surgeries.  On exam the patient is afebrile nontoxic appearing. She has suprapubic abdominal tenderness to palpation. No adnexal tenderness. No peritoneal signs. No psoas or obturator sign. On pelvic exam she has some blood in her vaginal vault. Cervix is closed. No adnexal tenderness or fullness. Only suprapubic tenderness to palpation. Urinalysis is without sign of infection. Wet prep is unremarkable. Pregnancy test is negative. Lipase is within normal limits. CMP is within normal limits. CBC is unremarkable. Normal white count. Pelvic ultrasound was obtained to rule out ovarian torsion. Exam was unremarkable. No evidence of torsion. No ovarian cysts or mass. No pelvic masses identified. Patient feeling better after pain medicine. She is able to tolerate by mouth. She has an appointment with a new OB/GYN in 2 weeks. I encouraged her to keep this appointment. No specific cause identified for her pain. Possibly abdominal cramping. I have a low suspicion for  appendicitis. Patient's repeat abdominal exam is benign. I did discuss return precautions. Tylenol and Bentyl for pain control. I advised the patient to follow-up with their primary care provider this week. I advised the patient to return to the emergency department with new or worsening symptoms or new concerns. The patient verbalized understanding and agreement with plan.    Final Clinical Impressions(s) / ED Diagnoses   Final diagnoses:  Lower abdominal pain  Non-intractable vomiting with nausea, unspecified vomiting type    New Prescriptions New Prescriptions   ACETAMINOPHEN (TYLENOL) 325 MG TABLET  Take 2 tablets (650 mg total) by mouth every 6 (six) hours as needed for mild pain or moderate pain.   DICYCLOMINE (BENTYL) 20 MG TABLET    Take 1 tablet (20 mg total) by mouth 2 (two) times daily.   ONDANSETRON (ZOFRAN ODT) 4 MG DISINTEGRATING TABLET    Take 1 tablet (4 mg total) by mouth every 8 (eight) hours as needed for nausea or vomiting.     Everlene Farrier, PA-C 10/30/16 1929    Lyndal Pulley, MD 10/31/16 [phone]

## 2016-10-30 NOTE — ED Notes (Signed)
Pt is laying on her stomach, states she feels much better.  Rates pain at 3/10.

## 2016-10-30 NOTE — ED Notes (Signed)
Pt reports mid low abd/pelvic pain x 3 days.  Denies any n/v.  She also reports vaginal bleeding.  LMP - Sepember d/t implanon.  She reports she is not having sex therefore, there is no possibility that she  Could be pregnant.

## 2016-10-30 NOTE — ED Triage Notes (Signed)
Patient reports that she has a history of ovarian cysts and states she had one rupture yesterday, but is continuing to c/o bilateral lower abdominal pain. Patient c/o vomiting x 4 today.

## 2016-10-30 NOTE — ED Notes (Signed)
Family at bedside. 

## 2016-10-30 NOTE — ED Notes (Signed)
US at bedside

## 2016-10-31 LAB — GC/CHLAMYDIA PROBE AMP (~~LOC~~) NOT AT ARMC
CHLAMYDIA, DNA PROBE: NEGATIVE
Neisseria Gonorrhea: NEGATIVE

## 2017-02-17 ENCOUNTER — Emergency Department (HOSPITAL_COMMUNITY)
Admission: EM | Admit: 2017-02-17 | Discharge: 2017-02-17 | Disposition: A | Discharge status: Home / Self Care | Payer: BLUE CROSS/BLUE SHIELD | Attending: Emergency Medicine | Admitting: Emergency Medicine

## 2017-02-17 ENCOUNTER — Encounter (HOSPITAL_COMMUNITY): Payer: Self-pay

## 2017-02-17 DIAGNOSIS — R109 Unspecified abdominal pain: Secondary | ICD-10-CM | POA: Diagnosis present

## 2017-02-17 DIAGNOSIS — E119 Type 2 diabetes mellitus without complications: Secondary | ICD-10-CM | POA: Diagnosis not present

## 2017-02-17 DIAGNOSIS — F17290 Nicotine dependence, other tobacco product, uncomplicated: Secondary | ICD-10-CM | POA: Diagnosis not present

## 2017-02-17 DIAGNOSIS — Z79899 Other long term (current) drug therapy: Secondary | ICD-10-CM | POA: Insufficient documentation

## 2017-02-17 DIAGNOSIS — Z91040 Latex allergy status: Secondary | ICD-10-CM | POA: Insufficient documentation

## 2017-02-17 DIAGNOSIS — R1030 Lower abdominal pain, unspecified: Secondary | ICD-10-CM | POA: Diagnosis not present

## 2017-02-17 DIAGNOSIS — I252 Old myocardial infarction: Secondary | ICD-10-CM | POA: Diagnosis not present

## 2017-02-17 LAB — COMPREHENSIVE METABOLIC PANEL
ALBUMIN: 3.2 g/dL — AB (ref 3.5–5.0)
ALT: 15 U/L (ref 14–54)
ANION GAP: 5 (ref 5–15)
AST: 17 U/L (ref 15–41)
Alkaline Phosphatase: 91 U/L (ref 38–126)
BILIRUBIN TOTAL: 0.6 mg/dL (ref 0.3–1.2)
BUN: 11 mg/dL (ref 6–20)
CHLORIDE: 107 mmol/L (ref 101–111)
CO2: 25 mmol/L (ref 22–32)
Calcium: 9.2 mg/dL (ref 8.9–10.3)
Creatinine, Ser: 0.72 mg/dL (ref 0.44–1.00)
GFR calc Af Amer: >60 mL/min (ref >60)
GLUCOSE: 90 mg/dL (ref 65–99)
POTASSIUM: 4.1 mmol/L (ref 3.5–5.1)
Sodium: 137 mmol/L (ref 135–145)
TOTAL PROTEIN: 6.6 g/dL (ref 6.5–8.1)

## 2017-02-17 LAB — CBC
HEMATOCRIT: 38.6 % (ref 36.0–46.0)
HEMOGLOBIN: 12.2 g/dL (ref 12.0–15.0)
MCH: 23.3 pg — ABNORMAL LOW (ref 26.0–34.0)
MCHC: 31.6 g/dL (ref 30.0–36.0)
MCV: 73.8 fL — ABNORMAL LOW (ref 78.0–100.0)
Platelets: 282 10*3/uL (ref 150–400)
RBC: 5.23 MIL/uL — ABNORMAL HIGH (ref 3.87–5.11)
RDW: 15.2 % (ref 11.5–15.5)
WBC: 8.2 10*3/uL (ref 4.0–10.5)

## 2017-02-17 LAB — I-STAT BETA HCG BLOOD, ED (MC, WL, AP ONLY): I-stat hCG, quantitative: <5 m[IU]/mL (ref <5)

## 2017-02-17 MED ORDER — KETOROLAC TROMETHAMINE 30 MG/ML IJ SOLN
30.0000 mg | Freq: Once | INTRAMUSCULAR | Status: AC
  Administered 2017-02-17: 30 mg via INTRAVENOUS
  Filled 2017-02-17: qty 1

## 2017-02-17 MED ORDER — IBUPROFEN 800 MG PO TABS: 800.0000 mg | ORAL_TABLET | Freq: Three times a day (TID) | ORAL | 0 refills | Status: DC | PRN

## 2017-02-17 MED ORDER — SODIUM CHLORIDE 0.9 % IV BOLUS (SEPSIS)
1000.0000 mL | Freq: Once | INTRAVENOUS | Status: AC
  Administered 2017-02-17: 1000 mL via INTRAVENOUS

## 2017-02-17 MED ORDER — ONDANSETRON HCL 4 MG/2ML IJ SOLN
4.0000 mg | Freq: Once | INTRAMUSCULAR | Status: AC
  Administered 2017-02-17: 4 mg via INTRAVENOUS
  Filled 2017-02-17: qty 2

## 2017-02-17 MED ORDER — HYDROCODONE-ACETAMINOPHEN 5-325 MG PO TABS: 1.0000 | ORAL_TABLET | Freq: Four times a day (QID) | ORAL | 0 refills | Status: DC | PRN

## 2017-02-17 MED ORDER — HYDROMORPHONE HCL 1 MG/ML IJ SOLN
1.0000 mg | Freq: Once | INTRAMUSCULAR | Status: AC
  Administered 2017-02-17: 1 mg via INTRAVENOUS
  Filled 2017-02-17: qty 1

## 2017-02-17 NOTE — ED Triage Notes (Signed)
Per Pt, pt is coming from home with complaints of lower abdominal pain and vaginal bleeding. Pt reports hx of endometriosis. Denies any irregular discharge or urinary symptoms.

## 2017-02-17 NOTE — Discharge Instructions (Signed)
Follow-up with your OB/GYN doctor next week if not improving

## 2017-02-17 NOTE — ED Provider Notes (Signed)
[facility]-EMERGENCY DEPT Provider Note   CSN: [id number] Arrival date & time: 02/17/17  1018     History   Chief Complaint Chief Complaint  Patient presents with  . Abdominal Pain    HPI Ashlee Thompson is a 21 y.o. female.  Patient complains of heavy periods and lower abdominal pain. She has a history of endometriosis and has history of the severe periods.   The history is provided by the patient. No language interpreter was used.  Abdominal Pain   This is a recurrent problem. The current episode started yesterday. The problem occurs constantly. The problem has not changed since onset.The pain is associated with an unknown factor. The pain is located in the suprapubic region. The quality of the pain is sharp. The pain is at a severity of 8/10. The pain is moderate. Pertinent negatives include diarrhea, flatus, frequency, hematuria and headaches. Nothing aggravates the symptoms.    Past Medical History:  Diagnosis Date  . Anxiety   . Bilateral ovarian cysts   . Diabetes mellitus without complication (HCC)   . GI bleed due to NSAIDs   . Myocardial infarction [facility])     Patient Active Problem List   Diagnosis Date Noted  . Headache   . UGI bleed 07/07/2016  . History of diabetes mellitus 07/07/2016  . Morbid obesity (HCC) 07/07/2016  . Hematemesis with nausea 07/07/2016  . GI bleed 07/07/2016    Past Surgical History:  Procedure Laterality Date  . ESOPHAGOGASTRODUODENOSCOPY N/A 07/07/2016   Procedure: ESOPHAGOGASTRODUODENOSCOPY (EGD);  Surgeon: Ruffin Frederick, MD;  Location: [facility] ENDOSCOPY;  Service: Gastroenterology;  Laterality: N/A;    OB History    Gravida Para Term Preterm AB Living   0 0 0 0 0 0   SAB TAB Ectopic Multiple Live Births   0 0 0 0 0       Home Medications    Prior to Admission medications   Medication Sig Start Date End Date Taking? Authorizing Provider  acetaminophen (TYLENOL) 325 MG tablet Take 2 tablets (650 mg total) by mouth every 6  (six) hours as needed for mild pain or moderate pain. 10/30/16  Yes Everlene Farrier, PA-C  hydrOXYzine (ATARAX/VISTARIL) 10 MG tablet Take 20 mg by mouth 3 (three) times daily as needed for anxiety.   Yes [provider]  linaclotide (LINZESS) 145 MCG CAPS capsule Take 145 mcg by mouth daily before breakfast.   Yes [provider]  phentermine 37.5 MG capsule Take 37.5 mg by mouth daily with breakfast.   Yes [provider]  dicyclomine (BENTYL) 20 MG tablet Take 1 tablet (20 mg total) by mouth 2 (two) times daily. Patient not taking: Reported on 02/17/2017 10/30/16   Everlene Farrier, PA-C  FLUoxetine (PROZAC) 10 MG capsule Take 10 mg by mouth daily.    [provider]  HYDROcodone-acetaminophen (NORCO/VICODIN) 5-325 MG tablet Take 1 tablet by mouth every 6 (six) hours as needed for moderate pain. 02/17/17   Bethann Berkshire, MD  ibuprofen (ADVIL,MOTRIN) 800 MG tablet Take 1 tablet (800 mg total) by mouth every 8 (eight) hours as needed for moderate pain. 02/17/17   Bethann Berkshire, MD  ondansetron (ZOFRAN ODT) 4 MG disintegrating tablet Take 1 tablet (4 mg total) by mouth every 8 (eight) hours as needed for nausea or vomiting. Patient not taking: Reported on 02/17/2017 10/30/16   Everlene Farrier, PA-C  pantoprazole (PROTONIX) 40 MG tablet Take 1 tablet (40 mg total) by mouth daily at 12 noon. Patient not taking:  Reported on 07/17/2016 07/09/16   Maretta Bees, MD  phenazopyridine (PYRIDIUM) 200 MG tablet Take 1 tablet (200 mg total) by mouth 3 (three) times daily as needed for pain (urethral spasm). Patient not taking: Reported on 10/30/2016 07/27/16   Aviva Signs, CNM  promethazine (PHENERGAN) 25 MG tablet Take 1 tablet (25 mg total) by mouth every 6 (six) hours as needed for nausea or vomiting. Patient not taking: Reported on 10/30/2016 07/28/16   Antony Madura, PA-C    Family History Family History  Problem Relation Age of Onset  . Hyperlipidemia Mother   .  Mental illness Mother   . Hyperlipidemia Father   . Mental illness Brother   . Heart disease Paternal Grandfather   . Hyperlipidemia Paternal Grandfather   . Hypertension Paternal Grandfather   . CAD Other   . Cancer Other     Social History Social History  Substance Use Topics  . Smoking status: Current Some Day Smoker    Packs/day: 0.25    Types: Cigars  . Smokeless tobacco: Never Used  . Alcohol use Yes     Comment: social     Allergies   Bentyl [dicyclomine hcl]; Tramadol; and Latex   Review of Systems Review of Systems  Constitutional: Negative for appetite change and fatigue.  HENT: Negative for congestion, ear discharge and sinus pressure.   Eyes: Negative for discharge.  Respiratory: Negative for cough.   Cardiovascular: Negative for chest pain.  Gastrointestinal: Positive for abdominal pain. Negative for diarrhea and flatus.  Genitourinary: Negative for frequency and hematuria.  Musculoskeletal: Negative for back pain.  Skin: Negative for rash.  Neurological: Negative for seizures and headaches.  Psychiatric/Behavioral: Negative for hallucinations.     Physical Exam Updated Vital Signs BP 112/69   Pulse 60   Temp 98.2 F (36.8 C) (Oral)   Resp 16   Ht 5' 5.5" (1.664 m)   Wt 123.8 kg (273 lb)   SpO2 100%   BMI 44.74 kg/m   Physical Exam  Constitutional: She is oriented to person, place, and time. She appears well-developed.  HENT:  Head: Normocephalic.  Eyes: Conjunctivae and EOM are normal. No scleral icterus.  Neck: Neck supple. No thyromegaly present.  Cardiovascular: Normal rate and regular rhythm.  Exam reveals no gallop and no friction rub.   No murmur heard. Pulmonary/Chest: No stridor. She has no wheezes. She has no rales. She exhibits no tenderness.  Abdominal: She exhibits no distension. There is tenderness. There is no rebound.  Moderate suprapubic tenderness  Musculoskeletal: Normal range of motion. She exhibits no edema.    Lymphadenopathy:    She has no cervical adenopathy.  Neurological: She is oriented to person, place, and time. She exhibits normal muscle tone. Coordination normal.  Skin: No rash noted. No erythema.  Psychiatric: She has a normal mood and affect. Her behavior is normal.     ED Treatments / Results  Labs (all labs ordered are listed, but only abnormal results are displayed) Labs Reviewed  COMPREHENSIVE METABOLIC PANEL - Abnormal; Notable for the following:       Result Value   Albumin 3.2 (*)    All other components within normal limits  CBC - Abnormal; Notable for the following:    RBC 5.23 (*)    MCV 73.8 (*)    MCH 23.3 (*)    All other components within normal limits  I-STAT BETA HCG BLOOD, ED ([facility], [facility], [facility] ONLY)    EKG  EKG Interpretation  None       Radiology No results found.  Procedures Procedures (including critical care time)  Medications Ordered in ED Medications  ketorolac (TORADOL) 30 MG/ML injection 30 mg (30 mg Intravenous Given 02/17/17 1150)  HYDROmorphone (DILAUDID) injection 1 mg (1 mg Intravenous Given 02/17/17 1150)  ondansetron (ZOFRAN) injection 4 mg (4 mg Intravenous Given 02/17/17 1150)  sodium chloride 0.9 % bolus 1,000 mL (0 mLs Intravenous Stopped 02/17/17 1232)     Initial Impression / Assessment and Plan / ED Course  I have reviewed the triage vital signs and the nursing notes.  Pertinent labs & imaging results that were available during my care of the patient were reviewed by me and considered in my medical decision making (see chart for details).     Labs unremarkable. Pain is related to her heavy menses and possibly her endometriosis. She will be put on Motrin and hydrocodone and will follow-up with her GYN doctor  Final Clinical Impressions(s) / ED Diagnoses   Final diagnoses:  Lower abdominal pain    New Prescriptions New Prescriptions   HYDROCODONE-ACETAMINOPHEN (NORCO/VICODIN) 5-325 MG TABLET    Take 1 tablet by mouth every 6  (six) hours as needed for moderate pain.   IBUPROFEN (ADVIL,MOTRIN) 800 MG TABLET    Take 1 tablet (800 mg total) by mouth every 8 (eight) hours as needed for moderate pain.     Bethann BerkshireZammit, Cindi Ghazarian, MD 02/17/17 [phone]

## 2017-02-18 ENCOUNTER — Emergency Department (HOSPITAL_COMMUNITY)
Admission: EM | Admit: 2017-02-18 | Discharge: 2017-02-18 | Disposition: A | Discharge status: Home / Self Care | Payer: BLUE CROSS/BLUE SHIELD | Attending: Emergency Medicine | Admitting: Emergency Medicine

## 2017-02-18 ENCOUNTER — Encounter (HOSPITAL_COMMUNITY): Payer: Self-pay | Admitting: Nurse Practitioner

## 2017-02-18 DIAGNOSIS — I252 Old myocardial infarction: Secondary | ICD-10-CM | POA: Diagnosis not present

## 2017-02-18 DIAGNOSIS — Z8742 Personal history of other diseases of the female genital tract: Secondary | ICD-10-CM | POA: Diagnosis not present

## 2017-02-18 DIAGNOSIS — Z91040 Latex allergy status: Secondary | ICD-10-CM | POA: Diagnosis not present

## 2017-02-18 DIAGNOSIS — Z79899 Other long term (current) drug therapy: Secondary | ICD-10-CM | POA: Diagnosis not present

## 2017-02-18 DIAGNOSIS — F17290 Nicotine dependence, other tobacco product, uncomplicated: Secondary | ICD-10-CM | POA: Insufficient documentation

## 2017-02-18 DIAGNOSIS — R1031 Right lower quadrant pain: Secondary | ICD-10-CM

## 2017-02-18 DIAGNOSIS — R1032 Left lower quadrant pain: Secondary | ICD-10-CM | POA: Diagnosis not present

## 2017-02-18 LAB — CBC
HCT: 36.3 % (ref 36.0–46.0)
Hemoglobin: 11.5 g/dL — ABNORMAL LOW (ref 12.0–15.0)
MCH: 23.4 pg — AB (ref 26.0–34.0)
MCHC: 31.7 g/dL (ref 30.0–36.0)
MCV: 73.9 fL — ABNORMAL LOW (ref 78.0–100.0)
Platelets: 281 10*3/uL (ref 150–400)
RBC: 4.91 MIL/uL (ref 3.87–5.11)
RDW: 15.4 % (ref 11.5–15.5)
WBC: 6.2 10*3/uL (ref 4.0–10.5)

## 2017-02-18 LAB — URINALYSIS, ROUTINE W REFLEX MICROSCOPIC
BACTERIA UA: NONE SEEN
BILIRUBIN URINE: NEGATIVE
GLUCOSE, UA: NEGATIVE mg/dL
KETONES UR: 5 mg/dL — AB
LEUKOCYTES UA: NEGATIVE
NITRITE: NEGATIVE
PROTEIN: 30 mg/dL — AB
Specific Gravity, Urine: 1.018 (ref 1.005–1.030)
pH: 6 (ref 5.0–8.0)

## 2017-02-18 LAB — COMPREHENSIVE METABOLIC PANEL
ALT: 13 U/L — AB (ref 14–54)
AST: 23 U/L (ref 15–41)
Albumin: 3.4 g/dL — ABNORMAL LOW (ref 3.5–5.0)
Alkaline Phosphatase: 84 U/L (ref 38–126)
Anion gap: 4 — ABNORMAL LOW (ref 5–15)
BUN: 11 mg/dL (ref 6–20)
CHLORIDE: 109 mmol/L (ref 101–111)
CO2: 27 mmol/L (ref 22–32)
CREATININE: 0.65 mg/dL (ref 0.44–1.00)
Calcium: 8.7 mg/dL — ABNORMAL LOW (ref 8.9–10.3)
GFR calc non Af Amer: >60 mL/min (ref >60)
Glucose, Bld: 100 mg/dL — ABNORMAL HIGH (ref 65–99)
POTASSIUM: 4.6 mmol/L (ref 3.5–5.1)
SODIUM: 140 mmol/L (ref 135–145)
Total Bilirubin: 0.8 mg/dL (ref 0.3–1.2)
Total Protein: 6.6 g/dL (ref 6.5–8.1)

## 2017-02-18 LAB — PREGNANCY, URINE: PREG TEST UR: NEGATIVE

## 2017-02-18 MED ORDER — ACETAMINOPHEN 500 MG PO TABS
1000.0000 mg | ORAL_TABLET | Freq: Once | ORAL | Status: DC
  Filled 2017-02-18: qty 2

## 2017-02-18 MED ORDER — IBUPROFEN 200 MG PO TABS
400.0000 mg | ORAL_TABLET | Freq: Once | ORAL | Status: DC
  Filled 2017-02-18: qty 2

## 2017-02-18 MED ORDER — HYDROCODONE-ACETAMINOPHEN 5-325 MG PO TABS
1.0000 | ORAL_TABLET | Freq: Four times a day (QID) | ORAL | 0 refills | Status: DC | PRN
Start: 2017-02-18 — End: 2017-04-13

## 2017-02-18 MED ORDER — HYDROMORPHONE HCL 1 MG/ML IJ SOLN
1.0000 mg | Freq: Once | INTRAMUSCULAR | Status: AC
  Administered 2017-02-18: 1 mg via INTRAVENOUS
  Filled 2017-02-18: qty 1

## 2017-02-18 NOTE — ED Provider Notes (Signed)
[facility]-EMERGENCY DEPT Provider Note   CSN: [id number] Arrival date & time: 02/18/17  0908     History   Chief Complaint Chief Complaint  Patient presents with  . Abdominal Pain    HPI Ashlee Thompson is a 21 y.o. female.  Patient c/o lower abdominal pain, hx same. Pt notes hx endometriosis.  States pain present for past few days, constant, dull, moderate, cramping, non radiating. States on period. No unusual vaginal discharge or bleeding. No dysuria. No back or flank pain. No fever or chills. Having normal bms. No vomiting. EMS notes pt appears very anxious and shaky.    The history is provided by the patient.  Seizures   Pertinent negatives include no confusion, no headaches, no sore throat, no chest pain, no vomiting and no diarrhea.    Past Medical History:  Diagnosis Date  . Anxiety   . Bilateral ovarian cysts   . Diabetes mellitus without complication (HCC)   . GI bleed due to NSAIDs   . Myocardial infarction [facility])     Patient Active Problem List   Diagnosis Date Noted  . Headache   . UGI bleed 07/07/2016  . History of diabetes mellitus 07/07/2016  . Morbid obesity (HCC) 07/07/2016  . Hematemesis with nausea 07/07/2016  . GI bleed 07/07/2016    Past Surgical History:  Procedure Laterality Date  . ESOPHAGOGASTRODUODENOSCOPY N/A 07/07/2016   Procedure: ESOPHAGOGASTRODUODENOSCOPY (EGD);  Surgeon: Ruffin Frederick, MD;  Location: [facility] ENDOSCOPY;  Service: Gastroenterology;  Laterality: N/A;    OB History    Gravida Para Term Preterm AB Living   0 0 0 0 0 0   SAB TAB Ectopic Multiple Live Births   0 0 0 0 0       Home Medications    Prior to Admission medications   Medication Sig Start Date End Date Taking? Authorizing Provider  acetaminophen (TYLENOL) 325 MG tablet Take 2 tablets (650 mg total) by mouth every 6 (six) hours as needed for mild pain or moderate pain. 10/30/16   Everlene Farrier, PA-C  dicyclomine (BENTYL) 20 MG tablet Take 1 tablet (20 mg  total) by mouth 2 (two) times daily. Patient not taking: Reported on 02/17/2017 10/30/16   Everlene Farrier, PA-C  FLUoxetine (PROZAC) 10 MG capsule Take 10 mg by mouth daily.    [provider]  HYDROcodone-acetaminophen (NORCO/VICODIN) 5-325 MG tablet Take 1 tablet by mouth every 6 (six) hours as needed for moderate pain. 02/17/17   Bethann Berkshire, MD  hydrOXYzine (ATARAX/VISTARIL) 10 MG tablet Take 20 mg by mouth 3 (three) times daily as needed for anxiety.    [provider]  ibuprofen (ADVIL,MOTRIN) 800 MG tablet Take 1 tablet (800 mg total) by mouth every 8 (eight) hours as needed for moderate pain. 02/17/17   Bethann Berkshire, MD  linaclotide (LINZESS) 145 MCG CAPS capsule Take 145 mcg by mouth daily before breakfast.    [provider]  ondansetron (ZOFRAN ODT) 4 MG disintegrating tablet Take 1 tablet (4 mg total) by mouth every 8 (eight) hours as needed for nausea or vomiting. Patient not taking: Reported on 02/17/2017 10/30/16   Everlene Farrier, PA-C  pantoprazole (PROTONIX) 40 MG tablet Take 1 tablet (40 mg total) by mouth daily at 12 noon. Patient not taking: Reported on 07/17/2016 07/09/16   Maretta Bees, MD  phenazopyridine (PYRIDIUM) 200 MG tablet Take 1 tablet (200 mg total) by mouth 3 (three) times daily as needed for pain (urethral spasm). Patient not taking: Reported  on 10/30/2016 07/27/16   Aviva SignsWilliams, Marie L, CNM  phentermine 37.5 MG capsule Take 37.5 mg by mouth daily with breakfast.    [provider]  promethazine (PHENERGAN) 25 MG tablet Take 1 tablet (25 mg total) by mouth every 6 (six) hours as needed for nausea or vomiting. Patient not taking: Reported on 10/30/2016 07/28/16   Antony MaduraHumes, Kelly, PA-C    Family History Family History  Problem Relation Age of Onset  . Hyperlipidemia Mother   . Mental illness Mother   . Hyperlipidemia Father   . Mental illness Brother   . Heart disease Paternal Grandfather   . Hyperlipidemia Paternal Grandfather    . Hypertension Paternal Grandfather   . CAD Other   . Cancer Other     Social History Social History  Substance Use Topics  . Smoking status: Current Some Day Smoker    Packs/day: 0.25    Types: Cigars  . Smokeless tobacco: Never Used  . Alcohol use Yes     Comment: social     Allergies   Bentyl [dicyclomine hcl]; Tramadol; and Latex   Review of Systems Review of Systems  Constitutional: Negative for fever.  HENT: Negative for sore throat.   Eyes: Negative for redness.  Respiratory: Negative for shortness of breath.   Cardiovascular: Negative for chest pain.  Gastrointestinal: Negative for diarrhea and vomiting.  Genitourinary: Negative for dysuria, flank pain and vaginal discharge.  Musculoskeletal: Negative for back pain.  Skin: Negative for rash.  Neurological: Negative for headaches.       Patient visibly shaking in route, no epileptic seizure activity noted.   Hematological: Does not bruise/bleed easily.  Psychiatric/Behavioral: Negative for confusion.     Physical Exam Updated Vital Signs BP (!) 140/107 (BP Location: Right Arm)   Pulse 94   Temp 98.9 F (37.2 C) (Oral)   Resp 20   Ht 1.651 m (5\' 5" )   Wt 123.8 kg (273 lb)   SpO2 99%   BMI 45.43 kg/m   Physical Exam  Constitutional: She appears well-developed and well-nourished.  Very anxious appearing.   HENT:  Mouth/Throat: Oropharynx is clear and moist.  Eyes: Conjunctivae are normal. Pupils are equal, round, and reactive to light. No scleral icterus.  Neck: Neck supple. No tracheal deviation present.  Cardiovascular: Normal rate, regular rhythm, normal heart sounds and intact distal pulses.  Exam reveals no gallop and no friction rub.   No murmur heard. Pulmonary/Chest: Effort normal and breath sounds normal. No respiratory distress.  Abdominal: Soft. Normal appearance and bowel sounds are normal. She exhibits no distension and no mass. There is no rebound and no guarding. No hernia.  Mild  suprapubic tenderness.   Genitourinary:  Genitourinary Comments: No cva tenderness  Musculoskeletal: She exhibits no edema.  Neurological: She is alert.  Skin: Skin is warm and dry. No rash noted.  Psychiatric:  Very anxious appearing.   Nursing note and vitals reviewed.    ED Treatments / Results  Labs (all labs ordered are listed, but only abnormal results are displayed) Results for orders placed or performed during the hospital encounter of 02/18/17  CBC  Result Value Ref Range   WBC 6.2 4.0 - 10.5 K/uL   RBC 4.91 3.87 - 5.11 MIL/uL   Hemoglobin 11.5 (L) 12.0 - 15.0 g/dL   HCT 27.236.3 53.636.0 - 64.446.0 %   MCV 73.9 (L) 78.0 - 100.0 fL   MCH 23.4 (L) 26.0 - 34.0 pg   MCHC 31.7 30.0 - 36.0  g/dL   RDW 16.1 09.6 - 04.5 %   Platelets 281 150 - 400 K/uL  Comprehensive metabolic panel  Result Value Ref Range   Sodium 140 135 - 145 mmol/L   Potassium 4.6 3.5 - 5.1 mmol/L   Chloride 109 101 - 111 mmol/L   CO2 27 22 - 32 mmol/L   Glucose, Bld 100 (H) 65 - 99 mg/dL   BUN 11 6 - 20 mg/dL   Creatinine, Ser 4.09 0.44 - 1.00 mg/dL   Calcium 8.7 (L) 8.9 - 10.3 mg/dL   Total Protein 6.6 6.5 - 8.1 g/dL   Albumin 3.4 (L) 3.5 - 5.0 g/dL   AST 23 15 - 41 U/L   ALT 13 (L) 14 - 54 U/L   Alkaline Phosphatase 84 38 - 126 U/L   Total Bilirubin 0.8 0.3 - 1.2 mg/dL   GFR calc non Af Amer >60 >60 mL/min   GFR calc Af Amer >60 >60 mL/min   Anion gap 4 (L) 5 - 15  Urinalysis, Routine w reflex microscopic  Result Value Ref Range   Color, Urine YELLOW YELLOW   APPearance HAZY (A) CLEAR   Specific Gravity, Urine 1.018 1.005 - 1.030   pH 6.0 5.0 - 8.0   Glucose, UA NEGATIVE NEGATIVE mg/dL   Hgb urine dipstick LARGE (A) NEGATIVE   Bilirubin Urine NEGATIVE NEGATIVE   Ketones, ur 5 (A) NEGATIVE mg/dL   Protein, ur 30 (A) NEGATIVE mg/dL   Nitrite NEGATIVE NEGATIVE   Leukocytes, UA NEGATIVE NEGATIVE   RBC / HPF TOO NUMEROUS TO COUNT 0 - 5 RBC/hpf   WBC, UA 0-5 0 - 5 WBC/hpf   Bacteria, UA NONE SEEN  NONE SEEN   Squamous Epithelial / LPF 0-5 (A) NONE SEEN   Mucous PRESENT   Pregnancy, urine  Result Value Ref Range   Preg Test, Ur NEGATIVE NEGATIVE    EKG  EKG Interpretation None       Radiology No results found.  Procedures Procedures (including critical care time)  Medications Ordered in ED Medications - No data to display   Initial Impression / Assessment and Plan / ED Course  I have reviewed the triage vital signs and the nursing notes.  Pertinent labs & imaging results that were available during my care of the patient were reviewed by me and considered in my medical decision making (see chart for details).  Iv ns. Labs sent.  Reviewed nursing notes and prior charts for additional history.   Pt requests pain med in ED.  Dilaudid 1 mg iv. zofran iv.   Po fluids.  Pt tolerating po. No vomiting.   Afebrile, wbc normal, abd is soft and non tender.  Pt indicates is on her normal period, and that pain is same as with her endometriosis in past.   Patient currently appears stable for d/c.     Final Clinical Impressions(s) / ED Diagnoses   Final diagnoses:  None    New Prescriptions New Prescriptions   No medications on file     Cathren Laine, MD 02/18/17 [phone]

## 2017-02-18 NOTE — ED Notes (Signed)
Pt is aware that a urine sample is needed but states she is unable to obtain one at this time

## 2017-02-18 NOTE — ED Triage Notes (Signed)
[facility] brought in [facility] from [facility]. Per ems employer reported she had just gotten to work and was sitting when they noticed she was staring and had facial twitching, rapid breathing, and would not say anything to them when they called her name. EMS reported patient was A&O on their arrival somewhat drowsy. Patient reported to them she was having some anxiety. Boyfriend present and reported she has hx of seizures and due to finances they have been unable to get her seizure medications. Neuro intact per ems. In route patient began having some anxiety about the ride and what may have been focal seizures but was able to responds. VS: NSR, 124/84, 90, 18, CBG 121. LAC #18. [facility] and [facility] and have not been eating or drinking much over the past few days.

## 2017-02-18 NOTE — Discharge Instructions (Addendum)
It was our pleasure to provide your ER care today - we hope that you feel better.  Rest. Drink plenty of fluids.  Take motrin or aleve as need for pain.   You may also take hydrocodone as need for pain. No driving for the next 6 hours or when taking hydrocodone. Also, do not take tylenol or acetaminophen containing medication when taking hydrocodone.   Follow up with your primary care doctor/OBGYN doctor in the coming week.  Return to ER if worse, new symptoms, fevers, worsening or severe pain, persistent vomiting, other concern.   You were given pain medication in the ER - no driving for the next 6 hours.

## 2017-02-18 NOTE — ED Notes (Signed)
Bed: WA21 Expected date:  Expected time:  Means of arrival:  Comments: EMS seizure 

## 2017-03-07 ENCOUNTER — Emergency Department (HOSPITAL_COMMUNITY): Payer: BLUE CROSS/BLUE SHIELD

## 2017-03-07 ENCOUNTER — Encounter (HOSPITAL_COMMUNITY): Payer: Self-pay | Admitting: Emergency Medicine

## 2017-03-07 ENCOUNTER — Emergency Department (HOSPITAL_COMMUNITY)
Admission: EM | Admit: 2017-03-07 | Discharge: 2017-03-07 | Disposition: A | Discharge status: Home / Self Care | Payer: BLUE CROSS/BLUE SHIELD | Attending: Emergency Medicine | Admitting: Emergency Medicine

## 2017-03-07 DIAGNOSIS — E119 Type 2 diabetes mellitus without complications: Secondary | ICD-10-CM | POA: Insufficient documentation

## 2017-03-07 DIAGNOSIS — W231XXA Caught, crushed, jammed, or pinched between stationary objects, initial encounter: Secondary | ICD-10-CM | POA: Insufficient documentation

## 2017-03-07 DIAGNOSIS — Y999 Unspecified external cause status: Secondary | ICD-10-CM | POA: Insufficient documentation

## 2017-03-07 DIAGNOSIS — S60222A Contusion of left hand, initial encounter: Secondary | ICD-10-CM | POA: Insufficient documentation

## 2017-03-07 DIAGNOSIS — F17210 Nicotine dependence, cigarettes, uncomplicated: Secondary | ICD-10-CM | POA: Diagnosis not present

## 2017-03-07 DIAGNOSIS — S6992XA Unspecified injury of left wrist, hand and finger(s), initial encounter: Secondary | ICD-10-CM | POA: Diagnosis present

## 2017-03-07 DIAGNOSIS — Y929 Unspecified place or not applicable: Secondary | ICD-10-CM | POA: Diagnosis not present

## 2017-03-07 DIAGNOSIS — Z79899 Other long term (current) drug therapy: Secondary | ICD-10-CM | POA: Diagnosis not present

## 2017-03-07 DIAGNOSIS — Z91040 Latex allergy status: Secondary | ICD-10-CM | POA: Diagnosis not present

## 2017-03-07 DIAGNOSIS — S60112A Contusion of left thumb with damage to nail, initial encounter: Secondary | ICD-10-CM | POA: Diagnosis not present

## 2017-03-07 DIAGNOSIS — Y939 Activity, unspecified: Secondary | ICD-10-CM | POA: Insufficient documentation

## 2017-03-07 DIAGNOSIS — S6010XA Contusion of unspecified finger with damage to nail, initial encounter: Secondary | ICD-10-CM

## 2017-03-07 MED ORDER — DICLOFENAC SODIUM 50 MG PO TBEC: 50.0000 mg | DELAYED_RELEASE_TABLET | Freq: Two times a day (BID) | ORAL | 0 refills | Status: DC

## 2017-03-07 MED ORDER — OXYCODONE-ACETAMINOPHEN 5-325 MG PO TABS
1.0000 | ORAL_TABLET | Freq: Once | ORAL | Status: AC
  Administered 2017-03-07: 1 via ORAL
  Filled 2017-03-07: qty 1

## 2017-03-07 NOTE — ED Provider Notes (Signed)
[facility]-EMERGENCY DEPT Provider Note   CSN: [id number] Arrival date & time: 03/07/17  1951  By signing my name below, I, Linna Darner, attest that this documentation has been prepared under the direction and in the presence of [facility] M. Damian Leavell, NP. Electronically Signed: Linna Darner, Scribe. 03/07/2017. 9:51 PM.  History   Chief Complaint Chief Complaint  Patient presents with  . Hand Pain   The history is provided by the patient. No language interpreter was used.  Hand Pain  This is a new problem. The current episode started 3 to 5 hours ago. The problem occurs constantly. The problem has not changed since onset.Exacerbated by: flexion and extension of left thumb. Nothing relieves the symptoms. She has tried nothing for the symptoms.    HPI Comments: Ashlee Thompson is a 21 y.o. female who presents to the Emergency Department for evaluation of a left hand injury sustained about four hours ago. She accidentally slammed her left hand in a car door. Patient is reporting significant pain, swelling, and bruising to her left thumb since the injury occurred. She states her pain is worse with flexion and extension of her left thumb. No medications or treatments tried PTA. Tetanus status is UTD. She denies numbness/tingling, open wounds, or any other associated symptoms.  Past Medical History:  Diagnosis Date  . Anxiety   . Bilateral ovarian cysts   . Diabetes mellitus without complication (HCC)   . GI bleed due to NSAIDs   . Myocardial infarction [facility])     Patient Active Problem List   Diagnosis Date Noted  . Headache   . UGI bleed 07/07/2016  . History of diabetes mellitus 07/07/2016  . Morbid obesity (HCC) 07/07/2016  . Hematemesis with nausea 07/07/2016  . GI bleed 07/07/2016    Past Surgical History:  Procedure Laterality Date  . ESOPHAGOGASTRODUODENOSCOPY N/A 07/07/2016   Procedure: ESOPHAGOGASTRODUODENOSCOPY (EGD);  Surgeon: Ruffin Frederick, MD;  Location: [facility] ENDOSCOPY;   Service: Gastroenterology;  Laterality: N/A;    OB History    Gravida Para Term Preterm AB Living   0 0 0 0 0 0   SAB TAB Ectopic Multiple Live Births   0 0 0 0 0       Home Medications    Prior to Admission medications   Medication Sig Start Date End Date Taking? Authorizing Provider  acetaminophen (TYLENOL) 325 MG tablet Take 2 tablets (650 mg total) by mouth every 6 (six) hours as needed for mild pain or moderate pain. Patient not taking: Reported on 02/18/2017 10/30/16   Everlene Farrier, PA-C  diclofenac (VOLTAREN) 50 MG EC tablet Take 1 tablet (50 mg total) by mouth 2 (two) times daily. 03/07/17   Janne Napoleon, NP  dicyclomine (BENTYL) 20 MG tablet Take 1 tablet (20 mg total) by mouth 2 (two) times daily. Patient not taking: Reported on 02/17/2017 10/30/16   Everlene Farrier, PA-C  FLUoxetine (PROZAC) 10 MG capsule Take 10 mg by mouth daily.    [provider]  HYDROcodone-acetaminophen (NORCO/VICODIN) 5-325 MG tablet Take 1 tablet by mouth every 6 (six) hours as needed for moderate pain. 02/17/17   Bethann Berkshire, MD  HYDROcodone-acetaminophen (NORCO/VICODIN) 5-325 MG tablet Take 1-2 tablets by mouth every 6 (six) hours as needed for moderate pain. 02/18/17   Cathren Laine, MD  hydrOXYzine (ATARAX/VISTARIL) 10 MG tablet Take 20 mg by mouth 3 (three) times daily as needed for anxiety.    [provider]  ibuprofen (ADVIL,MOTRIN) 800 MG tablet Take 1 tablet (  800 mg total) by mouth every 8 (eight) hours as needed for moderate pain. 02/17/17   Bethann BerkshireZammit, Joseph, MD  linaclotide (LINZESS) 145 MCG CAPS capsule Take 145 mcg by mouth daily before breakfast.    [provider]  ondansetron (ZOFRAN ODT) 4 MG disintegrating tablet Take 1 tablet (4 mg total) by mouth every 8 (eight) hours as needed for nausea or vomiting. Patient not taking: Reported on 02/17/2017 10/30/16   Everlene Farrieransie, William, PA-C  phentermine 37.5 MG capsule Take 37.5 mg by mouth daily with breakfast.    [provider]    Family History Family History  Problem Relation Age of Onset  . Hyperlipidemia Mother   . Mental illness Mother   . Hyperlipidemia Father   . Mental illness Brother   . Heart disease Paternal Grandfather   . Hyperlipidemia Paternal Grandfather   . Hypertension Paternal Grandfather   . CAD Other   . Cancer Other     Social History Social History  Substance Use Topics  . Smoking status: Current Some Day Smoker    Packs/day: 0.25    Types: Cigars  . Smokeless tobacco: Never Used  . Alcohol use Yes     Comment: social     Allergies   Bentyl [dicyclomine hcl]; Tramadol; and Latex   Review of Systems Review of Systems  Musculoskeletal: Positive for arthralgias and joint swelling.  Skin: Positive for color change. Negative for wound.  Neurological: Negative for numbness.  All other systems reviewed and are negative.  Physical Exam Updated Vital Signs BP 100/64 (BP Location: Right Arm)   Pulse 99   Temp 98.2 F (36.8 C) (Oral)   Resp 18   SpO2 100%   Physical Exam  Constitutional: She appears well-developed and well-nourished. No distress.  HENT:  Head: Normocephalic and atraumatic.  Eyes: Conjunctivae are normal.  Neck: Neck supple. No tracheal deviation present.  Cardiovascular: Normal rate.   Pulses:      Radial pulses are 2+ on the left side.  Pulmonary/Chest: Effort normal.  Musculoskeletal: She exhibits tenderness.       Left hand: She exhibits swelling. Normal sensation noted. She exhibits no thumb/finger opposition.       Hands: Swelling, tenderness, and ecchymosis to the left thumb along with a small subungual hematoma. Good capillary refill and circulation.  Neurological: She is alert.  Left hand: Negative finger-to-thumb opposition.  Skin: Skin is warm and dry.  Psychiatric: She has a normal mood and affect. Her behavior is normal.  Nursing note and vitals reviewed.  ED Treatments / Results  Labs (all labs ordered are listed,  but only abnormal results are displayed) Labs Reviewed - No data to display  Radiology Dg Hand Complete Left  Result Date: 03/07/2017 CLINICAL DATA:  Trauma to the left hand with pain. EXAM: LEFT HAND - COMPLETE 3+ VIEW COMPARISON:  None. FINDINGS: There is no evidence of fracture or dislocation. There is no evidence of arthropathy or other focal bone abnormality. Soft tissues are unremarkable. IMPRESSION: Negative. Electronically Signed   By: Sherian ReinWei-Chen  Lin M.D.   On: 03/07/2017 20:36    Procedures .Marland Kitchen.Incision and Drainage Date/Time: 03/07/2017 10:17 PM Performed by: Janne NapoleonNEESE, Kaliana Albino M Authorized by: Janne NapoleonNEESE, Zalma Channing M   Consent:    Consent obtained:  Verbal   Consent given by:  Patient   Risks discussed:  Bleeding and pain   Alternatives discussed:  No treatment Location:    Type:  Subungual hematoma   Location:  Upper extremity  Upper extremity location:  Finger   Finger location:  L thumb Pre-procedure details:    Skin preparation:  Betadine Anesthesia (see MAR for exact dosages):    Anesthesia method:  None Procedure type:    Complexity:  Simple Procedure details:    Incision depth:  Subungual (cautery stick)   Drainage:  Bloody Post-procedure details:    Patient tolerance of procedure:  Tolerated well, no immediate complications Comments:     Made hole through the nail with a cautery stick and released the blood.    (including critical care time)  DIAGNOSTIC STUDIES: Oxygen Saturation is 100% on RA, normal by my interpretation.    COORDINATION OF CARE: 9:50 PM Discussed treatment plan with pt at bedside and pt agreed to plan.  Medications Ordered in ED Medications  oxyCODONE-acetaminophen (PERCOCET/ROXICET) 5-325 MG per tablet 1 tablet (1 tablet Oral Given 03/07/17 2200)     Initial Impression / Assessment and Plan / ED Course  I have reviewed the triage vital signs and the nursing notes.  Pertinent imaging results that were available during my care of the patient  were reviewed by me and considered in my medical decision making (see chart for details).   Final Clinical Impressions(s) / ED Diagnoses  21 y.o. female with contusion to the left hand and subungual hematoma stable for d/c without focal neuro deficits and patient feeling better after blood drained from under the nail.   Final diagnoses:  Contusion of left hand, initial encounter  Subungual hematoma of digit of hand, initial encounter    New Prescriptions Discharge Medication List as of 03/07/2017 10:25 PM    START taking these medications   Details  diclofenac (VOLTAREN) 50 MG EC tablet Take 1 tablet (50 mg total) by mouth 2 (two) times daily., Starting Thu 03/07/2017, Print      I personally performed the services described in this documentation, which was scribed in my presence. The recorded information has been reviewed and is accurate.    Kerrie Buffalo [place], [place] 03/08/17 1610    Azalia Bilis, MD 03/11/17 [phone]

## 2017-03-07 NOTE — ED Notes (Signed)
Pt c/o left hand pain after slamming in car door. Pt has limited ROM in hand. See providers assessment.

## 2017-03-07 NOTE — ED Triage Notes (Signed)
Pt presens to Ed for assessmen of left hand pain "(mostly to thumb) after slamming her hand in a car door.  Some swelling, no deformity noed.

## 2017-03-07 NOTE — ED Notes (Addendum)
Pt upset regarding wait. Encouraged pt to stay to be evaluated by NP.

## 2017-03-07 NOTE — ED Notes (Signed)
Pt departed in NAD, refused use of wheelchair.  

## 2017-03-07 NOTE — Discharge Instructions (Signed)
Elevate the hand, take the medication for pain as needed. Follow up with your doctor or return here as needed.

## 2017-03-27 ENCOUNTER — Emergency Department (HOSPITAL_COMMUNITY)
Admission: EM | Admit: 2017-03-27 | Discharge: 2017-03-27 | Disposition: A | Discharge status: Home / Self Care | Payer: BLUE CROSS/BLUE SHIELD | Attending: Emergency Medicine | Admitting: Emergency Medicine

## 2017-03-27 ENCOUNTER — Encounter (HOSPITAL_COMMUNITY): Payer: Self-pay | Admitting: Emergency Medicine

## 2017-03-27 ENCOUNTER — Emergency Department (HOSPITAL_COMMUNITY): Payer: BLUE CROSS/BLUE SHIELD

## 2017-03-27 DIAGNOSIS — F17290 Nicotine dependence, other tobacco product, uncomplicated: Secondary | ICD-10-CM | POA: Insufficient documentation

## 2017-03-27 DIAGNOSIS — Z91040 Latex allergy status: Secondary | ICD-10-CM | POA: Insufficient documentation

## 2017-03-27 DIAGNOSIS — E119 Type 2 diabetes mellitus without complications: Secondary | ICD-10-CM | POA: Insufficient documentation

## 2017-03-27 DIAGNOSIS — R112 Nausea with vomiting, unspecified: Secondary | ICD-10-CM

## 2017-03-27 DIAGNOSIS — Z79899 Other long term (current) drug therapy: Secondary | ICD-10-CM | POA: Insufficient documentation

## 2017-03-27 DIAGNOSIS — Z791 Long term (current) use of non-steroidal anti-inflammatories (NSAID): Secondary | ICD-10-CM | POA: Insufficient documentation

## 2017-03-27 DIAGNOSIS — R1031 Right lower quadrant pain: Secondary | ICD-10-CM

## 2017-03-27 DIAGNOSIS — K5900 Constipation, unspecified: Secondary | ICD-10-CM | POA: Diagnosis not present

## 2017-03-27 LAB — COMPREHENSIVE METABOLIC PANEL
ALK PHOS: 103 U/L (ref 38–126)
ALT: 13 U/L — ABNORMAL LOW (ref 14–54)
ANION GAP: 7 (ref 5–15)
AST: 15 U/L (ref 15–41)
Albumin: 3.8 g/dL (ref 3.5–5.0)
BUN: 7 mg/dL (ref 6–20)
CALCIUM: 9.2 mg/dL (ref 8.9–10.3)
CHLORIDE: 105 mmol/L (ref 101–111)
CO2: 25 mmol/L (ref 22–32)
Creatinine, Ser: 0.62 mg/dL (ref 0.44–1.00)
GFR calc non Af Amer: >60 mL/min (ref >60)
Glucose, Bld: 83 mg/dL (ref 65–99)
POTASSIUM: 4.1 mmol/L (ref 3.5–5.1)
SODIUM: 137 mmol/L (ref 135–145)
Total Bilirubin: 0.5 mg/dL (ref 0.3–1.2)
Total Protein: 7.2 g/dL (ref 6.5–8.1)

## 2017-03-27 LAB — URINALYSIS, ROUTINE W REFLEX MICROSCOPIC
BILIRUBIN URINE: NEGATIVE
Glucose, UA: NEGATIVE mg/dL
Ketones, ur: NEGATIVE mg/dL
Leukocytes, UA: NEGATIVE
Nitrite: NEGATIVE
PROTEIN: 30 mg/dL — AB
SPECIFIC GRAVITY, URINE: 1.017 (ref 1.005–1.030)
pH: 8 (ref 5.0–8.0)

## 2017-03-27 LAB — CBC
HCT: 37.4 % (ref 36.0–46.0)
HEMOGLOBIN: 12 g/dL (ref 12.0–15.0)
MCH: 23.5 pg — ABNORMAL LOW (ref 26.0–34.0)
MCHC: 32.1 g/dL (ref 30.0–36.0)
MCV: 73.3 fL — ABNORMAL LOW (ref 78.0–100.0)
Platelets: 277 10*3/uL (ref 150–400)
RBC: 5.1 MIL/uL (ref 3.87–5.11)
RDW: 15.1 % (ref 11.5–15.5)
WBC: 7.6 10*3/uL (ref 4.0–10.5)

## 2017-03-27 LAB — I-STAT BETA HCG BLOOD, ED (MC, WL, AP ONLY)

## 2017-03-27 LAB — LIPASE, BLOOD: LIPASE: 23 U/L (ref 11–51)

## 2017-03-27 MED ORDER — HALOPERIDOL LACTATE 5 MG/ML IJ SOLN
3.0000 mg | Freq: Once | INTRAMUSCULAR | Status: AC
  Administered 2017-03-27: 3 mg via INTRAVENOUS
  Filled 2017-03-27: qty 1

## 2017-03-27 MED ORDER — IOPAMIDOL (ISOVUE-300) INJECTION 61%
100.0000 mL | Freq: Once | INTRAVENOUS | Status: AC | PRN
  Administered 2017-03-27: 100 mL via INTRAVENOUS

## 2017-03-27 MED ORDER — LACTULOSE 10 GM/15ML PO SOLN: 20.0000 g | Freq: Two times a day (BID) | ORAL | 0 refills | Status: AC | PRN

## 2017-03-27 MED ORDER — PROMETHAZINE HCL 25 MG PO TABS
25.0000 mg | ORAL_TABLET | Freq: Once | ORAL | Status: AC
  Administered 2017-03-27: 25 mg via ORAL
  Filled 2017-03-27: qty 1

## 2017-03-27 MED ORDER — ACETAMINOPHEN 500 MG PO TABS
1000.0000 mg | ORAL_TABLET | Freq: Once | ORAL | Status: AC
  Administered 2017-03-27: 1000 mg via ORAL
  Filled 2017-03-27: qty 2

## 2017-03-27 MED ORDER — DIPHENHYDRAMINE HCL 50 MG/ML IJ SOLN
25.0000 mg | Freq: Once | INTRAMUSCULAR | Status: AC
  Administered 2017-03-27: 25 mg via INTRAVENOUS
  Filled 2017-03-27: qty 1

## 2017-03-27 MED ORDER — LINACLOTIDE 145 MCG PO CAPS: 145.0000 ug | ORAL_CAPSULE | Freq: Every day | ORAL | 0 refills | Status: DC

## 2017-03-27 MED ORDER — SODIUM CHLORIDE 0.9 % IV BOLUS (SEPSIS)
1000.0000 mL | Freq: Once | INTRAVENOUS | Status: AC
  Administered 2017-03-27: 1000 mL via INTRAVENOUS

## 2017-03-27 MED ORDER — ONDANSETRON 4 MG PO TBDP
4.0000 mg | ORAL_TABLET | Freq: Once | ORAL | Status: AC | PRN
  Administered 2017-03-27: 4 mg via ORAL
  Filled 2017-03-27: qty 1

## 2017-03-27 MED ORDER — PROMETHAZINE HCL 25 MG PO TABS: 25.0000 mg | ORAL_TABLET | Freq: Four times a day (QID) | ORAL | 0 refills | Status: DC | PRN

## 2017-03-27 MED ORDER — KETOROLAC TROMETHAMINE 30 MG/ML IJ SOLN
30.0000 mg | Freq: Once | INTRAMUSCULAR | Status: AC
  Administered 2017-03-27: 30 mg via INTRAVENOUS
  Filled 2017-03-27: qty 1

## 2017-03-27 MED ORDER — IOPAMIDOL (ISOVUE-300) INJECTION 61%
INTRAVENOUS | Status: AC
  Filled 2017-03-27: qty 100

## 2017-03-27 NOTE — ED Triage Notes (Addendum)
Pt c/o constipation, nauea, and RLQ abdominal pain intermittently radiating to periumbilical area x 1 week. Tried stool softeners and enemas without relief, instructed to return here due to lack of improvement. Some bright and dark red blood seen when wiping after bowel movement.  Patient had multiple seizure-appearing episodes in triage followed by post-ictal state. Mother reports this is normal for patient and has been going on x 1 year but has never been evaluated by medical professional. Family history of grand mal seizures, including mother. Patient and mother not concerned about seizure activity, this is not why patient was brought to ED.

## 2017-03-27 NOTE — Discharge Instructions (Signed)
You may use the prescribed medications, or try miralax 6 caps in 1 32oz bottle of gatorade, decrease to 3 caps for 2 days, then 2 caps for 2 days, then 1 cap daily.

## 2017-03-29 NOTE — ED Provider Notes (Signed)
[facility]-EMERGENCY DEPT Provider Note   CSN: [id number] Arrival date & time: 03/27/17  1415     History   Chief Complaint Chief Complaint  Patient presents with  . Abdominal Pain  . Nausea    HPI Ashlee Thompson is a 21 y.o. female.  HPI  21 year old female presents with concern for abdominal pain and constipation. Mom reports that she's had constipation her whole life, but has never had an episode like this. She's not had a bowel movement for 7 days and possibly longer. Reports she's been eating normally, however will have severe abdominal pain about 10 minutes after eating, low buccal over in pain. Patient reports the pain is 10 out of 10. She's had associated nausea and vomiting, with about one episode of vomiting a day, with clear and yellow emesis. She's also had rectal bleeding beginning yesterday, bright red blood, with history of hemorrhoids. Reports rectal pain when she attempts to have a bowel movement. They had tried magnesium citrate, enemas and stool softeners at home without relief. She was evaluated by her PCP, who sent her to the emergency department for further evaluation given significant right lower quadrant tenderness. In addition, on arrival to the emergency department, patient began to have seizure-like activity. 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 THey are typically associated with pain or anxiety. 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 Patient has had MRI brain for other reasons within the last year which was normal. They report that they're not here for the seizure like activity but for the abdominal pain and constipation.  Past Medical History:  Diagnosis Date  . Anxiety   . Bilateral ovarian cysts   . Diabetes  mellitus without complication (HCC)   . GI bleed due to NSAIDs   . Myocardial infarction [facility])     Patient Active Problem List   Diagnosis Date Noted  . Headache   . UGI bleed 07/07/2016  . History of diabetes mellitus 07/07/2016  . Morbid obesity (HCC) 07/07/2016  . Hematemesis with nausea 07/07/2016  . GI bleed 07/07/2016    Past Surgical History:  Procedure Laterality Date  . ESOPHAGOGASTRODUODENOSCOPY N/A 07/07/2016   Procedure: ESOPHAGOGASTRODUODENOSCOPY (EGD);  Surgeon: Ruffin Frederick, MD;  Location: [facility] ENDOSCOPY;  Service: Gastroenterology;  Laterality: N/A;    OB History    Gravida Para Term Preterm AB Living   0 0 0 0 0 0   SAB TAB Ectopic Multiple Live Births   0 0 0 0 0       Home Medications    Prior to Admission medications   Medication Sig Start Date End Date Taking? Authorizing Provider  acetaminophen (TYLENOL) 325 MG tablet Take 2 tablets (650 mg total) by mouth every 6 (six) hours as needed for mild pain or moderate pain. Patient not taking: Reported on 02/18/2017 10/30/16   Everlene Farrier, PA-C  diclofenac (VOLTAREN) 50 MG EC tablet Take 1 tablet (50 mg total) by mouth 2 (two) times daily. 03/07/17   Janne Napoleon, NP  dicyclomine (BENTYL) 20 MG tablet Take 1 tablet (20 mg total) by mouth 2 (two) times daily. Patient not taking: Reported on 02/17/2017 10/30/16   Everlene Farrier, PA-C  FLUoxetine (PROZAC) 10 MG capsule Take 10 mg by mouth daily.    [provider]  HYDROcodone-acetaminophen (NORCO/VICODIN) 5-325 MG tablet Take 1 tablet by mouth every 6 (six) hours as needed for moderate pain. 02/17/17   Bethann Berkshire, MD  HYDROcodone-acetaminophen (NORCO/VICODIN) 5-325 MG tablet Take 1-2 tablets by mouth every 6 (six) hours as needed for moderate pain. 02/18/17   Cathren Laine, MD  hydrOXYzine (ATARAX/VISTARIL) 10 MG tablet Take 20 mg by mouth 3 (three) times daily as needed for anxiety.    [provider]  ibuprofen (ADVIL,MOTRIN) 800 MG  tablet Take 1 tablet (800 mg total) by mouth every 8 (eight) hours as needed for moderate pain. 02/17/17   Bethann Berkshire, MD  lactulose (CHRONULAC) 10 GM/15ML solution Take 30 mLs (20 g total) by mouth 2 (two) times daily as needed for moderate constipation. 03/27/17 03/29/17  Alvira Monday, MD  linaclotide [facility]) 145 MCG CAPS capsule Take 1 capsule (145 mcg total) by mouth daily before breakfast. 03/27/17   Alvira Monday, MD  ondansetron (ZOFRAN ODT) 4 MG disintegrating tablet Take 1 tablet (4 mg total) by mouth every 8 (eight) hours as needed for nausea or vomiting. Patient not taking: Reported on 02/17/2017 10/30/16   Everlene Farrier, PA-C  phentermine 37.5 MG capsule Take 37.5 mg by mouth daily with breakfast.    [provider]  promethazine (PHENERGAN) 25 MG tablet Take 1 tablet (25 mg total) by mouth every 6 (six) hours as needed for nausea or vomiting. 03/27/17   Alvira Monday, MD    Family History Family History  Problem Relation Age of Onset  . Hyperlipidemia Mother   . Mental illness Mother   . Hyperlipidemia Father   . Mental illness Brother   . Heart disease Paternal Grandfather   . Hyperlipidemia Paternal Grandfather   . Hypertension Paternal Grandfather   . CAD Other   . Cancer Other     Social History Social History  Substance Use Topics  . Smoking status: Current Some Day Smoker    Packs/day: 0.25    Types: Cigars  . Smokeless tobacco: Never Used  . Alcohol use Yes     Comment: social     Allergies   Bentyl [dicyclomine hcl]; Tramadol; and Latex   Review of Systems Review of Systems  Constitutional: Negative for fever.  HENT: Negative for sore throat.   Eyes: Negative for visual disturbance.  Respiratory: Negative for cough and shortness of breath.   Cardiovascular: Negative for chest pain.  Gastrointestinal: Positive for abdominal pain, anal bleeding, constipation, nausea and vomiting. Negative for diarrhea.  Genitourinary: Positive for  vaginal bleeding (menses). Negative for difficulty urinating and vaginal discharge.  Musculoskeletal: Negative for back pain and neck pain.  Skin: Negative for rash.  Neurological: Negative for syncope, facial asymmetry, weakness, numbness and headaches. Seizures: shaking and eye blinking, back arching spells in response to stress.     Physical Exam Updated Vital Signs BP 116/72 (BP Location: Right Arm)   Pulse 72   Temp 98 F (36.7 C) (Oral)   Resp 18   SpO2 100%   Physical Exam  Constitutional: She is oriented to person, place, and time. She appears well-developed and well-nourished. No distress.  HENT:  Head: Normocephalic and atraumatic.  Eyes: Conjunctivae and EOM are normal.  Neck: Normal range of motion.  Cardiovascular: Normal rate, regular rhythm, normal heart sounds and intact distal pulses.  Exam reveals no gallop and no friction rub.   No murmur heard. Pulmonary/Chest: Effort normal and breath sounds normal. No respiratory distress. She has no wheezes. She has  no rales.  Abdominal: Soft. She exhibits no distension. There is tenderness (RLQ). There is guarding.  Musculoskeletal: She exhibits no edema or tenderness.  Neurological: She is alert and oriented to person, place, and time.  Skin: Skin is warm and dry. No rash noted. She is not diaphoretic. No erythema.  Nursing note and vitals reviewed.    ED Treatments / Results  Labs (all labs ordered are listed, but only abnormal results are displayed) Labs Reviewed  COMPREHENSIVE METABOLIC PANEL - Abnormal; Notable for the following:       Result Value   ALT 13 (*)    All other components within normal limits  CBC - Abnormal; Notable for the following:    MCV 73.3 (*)    MCH 23.5 (*)    All other components within normal limits  URINALYSIS, ROUTINE W REFLEX MICROSCOPIC - Abnormal; Notable for the following:    APPearance CLOUDY (*)    Hgb urine dipstick LARGE (*)    Protein, ur 30 (*)    Bacteria, UA RARE (*)     Squamous Epithelial / LPF 6-30 (*)    All other components within normal limits  LIPASE, BLOOD  I-STAT BETA HCG BLOOD, ED ([facility], [facility], [facility] ONLY)    EKG  EKG Interpretation None       Radiology Ct Abdomen Pelvis W Contrast  Result Date: 03/27/2017 CLINICAL DATA:  Right lower quadrant pain EXAM: CT ABDOMEN AND PELVIS WITH CONTRAST TECHNIQUE: Multidetector CT imaging of the abdomen and pelvis was performed using the standard protocol following bolus administration of intravenous contrast. CONTRAST:  100mL ISOVUE-300 IOPAMIDOL (ISOVUE-300) INJECTION 61% COMPARISON:  05/04/2016 FINDINGS: Lower chest: Lung bases are clear. No effusions. Heart is normal size. Hepatobiliary: No focal hepatic abnormality. Gallbladder unremarkable. Pancreas: No focal abnormality or ductal dilatation. Spleen: No focal abnormality.  Normal size. Adrenals/Urinary Tract: No adrenal abnormality. No focal renal abnormality. No stones or hydronephrosis. Urinary bladder is unremarkable. Stomach/Bowel: Appendix is normal. Stomach, large and small bowel grossly unremarkable. Vascular/Lymphatic: No evidence of aneurysm or adenopathy. Reproductive: Uterus and adnexa unremarkable.  No mass. Other: No free fluid or free air. Musculoskeletal: No acute bony abnormality. IMPRESSION: Normal appendix. No acute findings in the abdomen or pelvis coronal Electronically Signed   By: Charlett NoseKevin  Dover M.D.   On: 03/27/2017 17:39    Procedures Procedures (including critical care time)  Medications Ordered in ED Medications  ondansetron (ZOFRAN-ODT) disintegrating tablet 4 mg (4 mg Oral Given 03/27/17 1429)  sodium chloride 0.9 % bolus 1,000 mL (0 mLs Intravenous Stopped 03/27/17 1914)  haloperidol lactate (HALDOL) injection 3 mg (3 mg Intravenous Given 03/27/17 1605)  ketorolac (TORADOL) 30 MG/ML injection 30 mg (30 mg Intravenous Given 03/27/17 1605)  diphenhydrAMINE (BENADRYL) injection 25 mg (25 mg Intravenous Given 03/27/17 1636)  iopamidol  (ISOVUE-300) 61 % injection 100 mL (100 mLs Intravenous Contrast Given 03/27/17 1721)  acetaminophen (TYLENOL) tablet 1,000 mg (1,000 mg Oral Given 03/27/17 1951)  promethazine (PHENERGAN) tablet 25 mg (25 mg Oral Given 03/27/17 1951)     Initial Impression / Assessment and Plan / ED Course  I have reviewed the triage vital signs and the nursing notes.  Pertinent labs & imaging results that were available during my care of the patient were reviewed by me and considered in my medical decision making (see chart for details).     21yo female presents with concern for abdominal pain and constipation. Patient with spells during examination that are most consistent with nonepileptic seizures  as suspected by mom. She shows generalized eye fluttering, back arching and rocking/shaking and is continuing to respond to questions, brought on by pain, improved with treatment for pain in ED. 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  Regarding her abdominal pain, CT abdomen done and negative. Suspect it is secondary to severe constipation. Gave directions for miralax use, rx for linzess and rx for lactulose x2days as well as phenergan for nausea. Otherwise recommend tylenol for pain. Patient discharged in stable condition with understanding of reasons to return.    Final Clinical Impressions(s) / ED Diagnoses   Final diagnoses:  Constipation, unspecified constipation type  Right lower quadrant abdominal pain  Nausea and vomiting, intractability of vomiting not specified, unspecified vomiting type    New Prescriptions Discharge Medication List as of 03/27/2017  7:41 PM    START taking these medications   Details  lactulose (CHRONULAC) 10 GM/15ML solution Take 30 mLs (20 g total) by mouth 2 (two) times daily as needed for moderate constipation., Starting Wed 03/27/2017, Until Fri 03/29/2017, Print    promethazine (PHENERGAN) 25 MG tablet Take 1  tablet (25 mg total) by mouth every 6 (six) hours as needed for nausea or vomiting., Starting Wed 03/27/2017, Print         Alvira Monday, MD 03/29/17 [phone]

## 2017-04-13 ENCOUNTER — Emergency Department (HOSPITAL_COMMUNITY)
Admission: EM | Admit: 2017-04-13 | Discharge: 2017-04-13 | Disposition: A | Discharge status: Home / Self Care | Payer: BLUE CROSS/BLUE SHIELD | Attending: Emergency Medicine | Admitting: Emergency Medicine

## 2017-04-13 ENCOUNTER — Encounter (HOSPITAL_COMMUNITY): Payer: Self-pay | Admitting: Emergency Medicine

## 2017-04-13 DIAGNOSIS — Z91040 Latex allergy status: Secondary | ICD-10-CM | POA: Diagnosis not present

## 2017-04-13 DIAGNOSIS — N938 Other specified abnormal uterine and vaginal bleeding: Secondary | ICD-10-CM | POA: Diagnosis not present

## 2017-04-13 DIAGNOSIS — E119 Type 2 diabetes mellitus without complications: Secondary | ICD-10-CM | POA: Diagnosis not present

## 2017-04-13 DIAGNOSIS — N939 Abnormal uterine and vaginal bleeding, unspecified: Secondary | ICD-10-CM

## 2017-04-13 DIAGNOSIS — F17290 Nicotine dependence, other tobacco product, uncomplicated: Secondary | ICD-10-CM | POA: Diagnosis not present

## 2017-04-13 DIAGNOSIS — I252 Old myocardial infarction: Secondary | ICD-10-CM | POA: Insufficient documentation

## 2017-04-13 DIAGNOSIS — R1030 Lower abdominal pain, unspecified: Secondary | ICD-10-CM

## 2017-04-13 DIAGNOSIS — R1031 Right lower quadrant pain: Secondary | ICD-10-CM | POA: Diagnosis present

## 2017-04-13 HISTORY — DX: Conversion disorder with seizures or convulsions: F44.5

## 2017-04-13 HISTORY — DX: Unspecified convulsions: R56.9

## 2017-04-13 LAB — WET PREP, GENITAL
Clue Cells Wet Prep HPF POC: NONE SEEN
SPERM: NONE SEEN
TRICH WET PREP: NONE SEEN
YEAST WET PREP: NONE SEEN

## 2017-04-13 LAB — I-STAT BETA HCG BLOOD, ED (MC, WL, AP ONLY): I-stat hCG, quantitative: <5 m[IU]/mL (ref <5)

## 2017-04-13 LAB — URINALYSIS, ROUTINE W REFLEX MICROSCOPIC
Bilirubin Urine: NEGATIVE
GLUCOSE, UA: NEGATIVE mg/dL
Ketones, ur: 20 mg/dL — AB
LEUKOCYTES UA: NEGATIVE
Nitrite: NEGATIVE
PH: 6 (ref 5.0–8.0)
Protein, ur: 100 mg/dL — AB
Specific Gravity, Urine: 1.025 (ref 1.005–1.030)

## 2017-04-13 LAB — COMPREHENSIVE METABOLIC PANEL
ALT: 13 U/L — AB (ref 14–54)
AST: 16 U/L (ref 15–41)
Albumin: 3.5 g/dL (ref 3.5–5.0)
Alkaline Phosphatase: 77 U/L (ref 38–126)
Anion gap: 8 (ref 5–15)
BUN: 5 mg/dL — ABNORMAL LOW (ref 6–20)
CHLORIDE: 107 mmol/L (ref 101–111)
CO2: 24 mmol/L (ref 22–32)
Calcium: 9 mg/dL (ref 8.9–10.3)
Creatinine, Ser: 0.71 mg/dL (ref 0.44–1.00)
Glucose, Bld: 87 mg/dL (ref 65–99)
POTASSIUM: 3.8 mmol/L (ref 3.5–5.1)
SODIUM: 139 mmol/L (ref 135–145)
Total Bilirubin: 0.4 mg/dL (ref 0.3–1.2)
Total Protein: 6.5 g/dL (ref 6.5–8.1)

## 2017-04-13 LAB — CBC
HEMATOCRIT: 40.8 % (ref 36.0–46.0)
HEMOGLOBIN: 13.1 g/dL (ref 12.0–15.0)
MCH: 23.5 pg — AB (ref 26.0–34.0)
MCHC: 32.1 g/dL (ref 30.0–36.0)
MCV: 73.1 fL — AB (ref 78.0–100.0)
Platelets: 312 10*3/uL (ref 150–400)
RBC: 5.58 MIL/uL — AB (ref 3.87–5.11)
RDW: 15.3 % (ref 11.5–15.5)
WBC: 12.3 10*3/uL — AB (ref 4.0–10.5)

## 2017-04-13 LAB — LIPASE, BLOOD: LIPASE: 31 U/L (ref 11–51)

## 2017-04-13 MED ORDER — ONDANSETRON 4 MG PO TBDP
ORAL_TABLET | ORAL | 0 refills | Status: DC
Start: 2017-04-13 — End: 2017-06-11

## 2017-04-13 MED ORDER — NAPROXEN 500 MG PO TABS: 500.0000 mg | ORAL_TABLET | Freq: Two times a day (BID) | ORAL | 0 refills | Status: DC

## 2017-04-13 MED ORDER — ONDANSETRON 4 MG PO TBDP
ORAL_TABLET | ORAL | Status: AC
  Filled 2017-04-13: qty 1

## 2017-04-13 MED ORDER — LORAZEPAM 2 MG/ML IJ SOLN
1.0000 mg | Freq: Once | INTRAMUSCULAR | Status: AC
  Administered 2017-04-13: 1 mg via INTRAVENOUS
  Filled 2017-04-13: qty 1

## 2017-04-13 MED ORDER — KETOROLAC TROMETHAMINE 30 MG/ML IJ SOLN
30.0000 mg | Freq: Once | INTRAMUSCULAR | Status: AC
  Administered 2017-04-13: 30 mg via INTRAVENOUS
  Filled 2017-04-13: qty 1

## 2017-04-13 MED ORDER — OXYCODONE HCL 5 MG PO TABS
10.0000 mg | ORAL_TABLET | Freq: Once | ORAL | Status: AC
  Administered 2017-04-13: 10 mg via ORAL
  Filled 2017-04-13: qty 2

## 2017-04-13 NOTE — ED Notes (Signed)
Pt had another pseudoseizure in the waiting room.

## 2017-04-13 NOTE — ED Triage Notes (Signed)
Pt reports lower abd pain with N/V present since yesterday. In triage pt had "seizure like activity" but this RN sternal rubbed pt and then pt was immediately able to speak and answer all my questions with no confusion... Per pt she has "history of seizures"

## 2017-04-13 NOTE — ED Provider Notes (Signed)
[facility]-EMERGENCY DEPT Provider Note   CSN: [id number] Arrival date & time: 04/13/17  0115     History   Chief Complaint Chief Complaint  Patient presents with  . Abdominal Pain    HPI Ashlee Thompson is a 21 y.o. female with a hx of anxiety, pseudoseizures, DM, ovarian cysts, presents to the Emergency Department complaining of gradual, persistent, progressively worsening RLQ abd pain with associated nausea and vomiting onset yesterday. Pain is crampy in nature, rated at a 10/10.  She reports that red meat and dairy makes this worse.  Pt denies fever, chills, headache, neck pain, chest pain, SOB, diarrhea, weakness, dizziness, syncope, dysuria, hematuria, vaginal discharge. Record review shows pts has been evaluated for this multiple times in the past.  Patient also reports that she began to notice blood clots this morning. She is unsure if they are from her urethra or her vagina. She reports several pseudoseizures today and hx of same as noted in the chart.  Patient significant other in the room notes that the seizure-like activity is baseline for the patient. She reports that it happens when she becomes anxious. She states that she does not want her seizure-like activity evaluated. She only wants her abdominal pain treated. Recommend review shows that patient has had this before and has had an MRI of partially 1 year ago which was normal.   The history is provided by the patient, medical records and a significant other. No language interpreter was used.    Past Medical History:  Diagnosis Date  . Anxiety   . Bilateral ovarian cysts   . Diabetes mellitus without complication (HCC)   . GI bleed due to NSAIDs   . Myocardial infarction (HCC)   . Pseudoseizures     Patient Active Problem List   Diagnosis Date Noted  . Headache   . UGI bleed 07/07/2016  . History of diabetes mellitus 07/07/2016  . Morbid obesity (HCC) 07/07/2016  . Hematemesis with nausea 07/07/2016  . GI bleed  07/07/2016    Past Surgical History:  Procedure Laterality Date  . ESOPHAGOGASTRODUODENOSCOPY N/A 07/07/2016   Procedure: ESOPHAGOGASTRODUODENOSCOPY (EGD);  Surgeon: Ruffin FrederickSteven Paul Armbruster, MD;  [facility];  Service: Gastroenterology;  Laterality: N/A;    OB History    Gravida Para Term Preterm AB Living   0 0 0 0 0 0   SAB TAB Ectopic Multiple Live Births   0 0 0 0 0       Home Medications    Prior to Admission medications   Medication Sig Start Date End Date Taking? Authorizing Provider  linaclotide Karlene Einstein(LINZESS) 145 MCG CAPS capsule Take 1 capsule (145 mcg total) by mouth daily before breakfast. Patient not taking: Reported on 04/13/2017 03/27/17   Alvira MondaySchlossman, Erin, MD  naproxen (NAPROSYN) 500 MG tablet Take 1 tablet (500 mg total) by mouth 2 (two) times daily with a meal. 04/13/17   Kysean Sweet, Dahlia ClientHannah, PA-C  ondansetron (ZOFRAN ODT) 4 MG disintegrating tablet 4mg  ODT q4 hours prn nausea/vomit 04/13/17   [place] Schreiner, Dahlia ClientHannah, PA-C    Family History Family History  Problem Relation Age of Onset  . Hyperlipidemia Mother   . Mental illness Mother   . Hyperlipidemia Father   . Mental illness Brother   . Heart disease Paternal Grandfather   . Hyperlipidemia Paternal Grandfather   . Hypertension Paternal Grandfather   . CAD Other   . Cancer Other     Social History Social History  Substance Use Topics  . Smoking status: Current  Some Day Smoker    Packs/day: 0.25    Types: Cigars  . Smokeless tobacco: Never Used  . Alcohol use Yes     Comment: social     Allergies   Bentyl [dicyclomine hcl]; Haldol [haloperidol lactate]; Tramadol; and Latex   Review of Systems Review of Systems  Constitutional: Negative for appetite change, diaphoresis, fatigue, fever and unexpected weight change.  HENT: Negative for mouth sores.   Eyes: Negative for visual disturbance.  Respiratory: Negative for cough, chest tightness, shortness of breath and wheezing.     Cardiovascular: Negative for chest pain.  Gastrointestinal: Positive for abdominal pain, nausea and vomiting. Negative for constipation and diarrhea.  Endocrine: Negative for polydipsia, polyphagia and polyuria.  Genitourinary: Negative for dysuria, frequency, hematuria and urgency.  Musculoskeletal: Negative for back pain and neck stiffness.  Skin: Negative for rash.  Allergic/Immunologic: Negative for immunocompromised state.  Neurological: Positive for seizures (pseudoseizures). Negative for syncope, light-headedness and headaches.  Hematological: Does not bruise/bleed easily.  Psychiatric/Behavioral: Negative for sleep disturbance. The patient is not nervous/anxious.   All other systems reviewed and are negative.    Physical Exam Updated Vital Signs BP (!) 141/109 (BP Location: Right Arm)   Pulse 93   Temp 98.2 F (36.8 C)   Resp 18   Ht 5\' 5"  (1.651 m)   Wt 122.9 kg (271 lb)   SpO2 99%   BMI 45.10 kg/m   Physical Exam  Constitutional: She appears well-developed and well-nourished. No distress.  Awake, alert, nontoxic appearance  HENT:  Head: Normocephalic and atraumatic.  Mouth/Throat: Oropharynx is clear and moist. No oropharyngeal exudate.  Eyes: Conjunctivae are normal. No scleral icterus.  Neck: Normal range of motion. Neck supple.  Cardiovascular: Normal rate, regular rhythm, normal heart sounds and intact distal pulses.   No murmur heard. Pulmonary/Chest: Effort normal and breath sounds normal. No respiratory distress. She has no wheezes.  Equal chest expansion  Abdominal: Soft. Bowel sounds are normal. She exhibits no distension and no mass. There is tenderness in the right lower quadrant and left lower quadrant. There is no rigidity, no rebound, no guarding and no CVA tenderness. Hernia confirmed negative in the right inguinal area and confirmed negative in the left inguinal area.  Patient complains of lower abdominal tenderness on palpation but she has no  guarding or signs of distress upon palpation.  Genitourinary: Uterus normal. No labial fusion. There is no rash, tenderness or lesion on the right labia. There is no rash, tenderness or lesion on the left labia. Uterus is not deviated, not enlarged, not fixed and not tender. Cervix exhibits no motion tenderness, no discharge and no friability. Right adnexum displays no mass, no tenderness and no fullness. Left adnexum displays no mass, no tenderness and no fullness. There is bleeding in the vagina. No erythema or tenderness in the vagina. No foreign body in the vagina. No signs of injury around the vagina. No vaginal discharge found.  Musculoskeletal: Normal range of motion. She exhibits no edema.  Lymphadenopathy:       Right: No inguinal adenopathy present.       Left: No inguinal adenopathy present.  Neurological: She is alert.  Speech is clear and goal oriented Moves extremities without ataxia  Skin: Skin is warm and dry. She is not diaphoretic. No erythema.  Psychiatric: She has a normal mood and affect.  Nursing note and vitals reviewed.    ED Treatments / Results  Labs (all labs ordered are listed, but only abnormal  results are displayed) Labs Reviewed  COMPREHENSIVE METABOLIC PANEL - Abnormal; Notable for the following:       Result Value   BUN 5 (*)    ALT 13 (*)    All other components within normal limits  CBC - Abnormal; Notable for the following:    WBC 12.3 (*)    RBC 5.58 (*)    MCV 73.1 (*)    MCH 23.5 (*)    All other components within normal limits  URINALYSIS, ROUTINE W REFLEX MICROSCOPIC - Abnormal; Notable for the following:    Color, Urine AMBER (*)    APPearance CLOUDY (*)    Hgb urine dipstick LARGE (*)    Ketones, ur 20 (*)    Protein, ur 100 (*)    Bacteria, UA MANY (*)    Squamous Epithelial / LPF 6-30 (*)    All other components within normal limits  WET PREP, GENITAL  LIPASE, BLOOD  I-STAT BETA HCG BLOOD, ED ([facility], [facility], [facility] ONLY)  GC/CHLAMYDIA PROBE  AMP ([place]) NOT AT [facility]    Procedures Procedures (including critical care time)  Medications Ordered in ED Medications  ondansetron (ZOFRAN-ODT) 4 MG disintegrating tablet (not administered)  oxyCODONE (Oxy IR/ROXICODONE) immediate release tablet 10 mg (not administered)  LORazepam (ATIVAN) injection 1 mg (1 mg Intravenous Given 04/13/17 0515)  ketorolac (TORADOL) 30 MG/ML injection 30 mg (30 mg Intravenous Given 04/13/17 0515)     Initial Impression / Assessment and Plan / ED Course  I have reviewed the triage vital signs and the nursing notes.  Pertinent labs & imaging results that were available during my care of the patient were reviewed by me and considered in my medical decision making (see chart for details).  Clinical Course as of Apr 13 648  Sat Apr 13, 2017  1610 Pt sleeping soundly with continued soft abd.  After being awoken she reports improvement in pain.  [HM]    Clinical Course User Index [HM] Tamika Nou, Dahlia Client, [place]    Patient presents with lower abdominal pain but benign exam. During the examination and history patient has several spells that are more consistent with nonepileptic seizures. During this time she has generalized eyes fluttering, back arching and rocking however she is able to respond to questions throughout this episode. She does not lose consciousness or have hypoxic episodes or apnea. These appear to be pseudoseizure in nature.  Record review shows patient was evaluated on 03/27/2017 for similar symptoms with a normal CT scan. With a benign abdomen and normal pelvic exam.  I do not feel that additional imaging is warranted today.  She has a mild leukocytosis but is without anemia or electrolyte abnormalities. Hemoglobin was noted in the urine but no nitrites or leukocytes to suggest urinary tract infection. This is from patient's vaginal bleeding.  No CMT or vaginal discharge.  No CVA tenderness to suggest pyelonephritis. Patient is  afebrile.  Final Clinical Impressions(s) / ED Diagnoses   Final diagnoses:  Lower abdominal pain  Vaginal bleeding    New Prescriptions New Prescriptions   NAPROXEN (NAPROSYN) 500 MG TABLET    Take 1 tablet (500 mg total) by mouth 2 (two) times daily with a meal.   ONDANSETRON (ZOFRAN ODT) 4 MG DISINTEGRATING TABLET    4mg  ODT q4 hours prn nausea/vomit     Dierdre Forth, PA-C 04/13/17 9604    Gilda Crease, MD 04/21/17 [phone]

## 2017-04-13 NOTE — ED Notes (Signed)
ED Provider at bedside. 

## 2017-04-13 NOTE — Discharge Instructions (Signed)
1. Medications: zofran, naprosyn, usual home medications 2. Treatment: rest, drink plenty of fluids, advance diet slowly 3. Follow Up: Please followup with your primary doctor in 2 days for discussion of your diagnoses and further evaluation after today's visit; if you do not have a primary care doctor use the resource guide provided to find one; Please return to the ER for persistent vomiting, high fevers or worsening symptoms

## 2017-04-15 LAB — GC/CHLAMYDIA PROBE AMP (~~LOC~~) NOT AT ARMC
Chlamydia: NEGATIVE
Neisseria Gonorrhea: NEGATIVE

## 2017-06-10 ENCOUNTER — Encounter (HOSPITAL_COMMUNITY): Payer: Self-pay | Admitting: Emergency Medicine

## 2017-06-10 ENCOUNTER — Emergency Department (HOSPITAL_COMMUNITY): Payer: BLUE CROSS/BLUE SHIELD

## 2017-06-10 ENCOUNTER — Emergency Department (HOSPITAL_COMMUNITY)
Admission: EM | Admit: 2017-06-10 | Discharge: 2017-06-10 | Disposition: A | Discharge status: Home / Self Care | Payer: BLUE CROSS/BLUE SHIELD | Attending: Emergency Medicine | Admitting: Emergency Medicine

## 2017-06-10 DIAGNOSIS — E119 Type 2 diabetes mellitus without complications: Secondary | ICD-10-CM | POA: Insufficient documentation

## 2017-06-10 DIAGNOSIS — Z79899 Other long term (current) drug therapy: Secondary | ICD-10-CM | POA: Insufficient documentation

## 2017-06-10 DIAGNOSIS — R1030 Lower abdominal pain, unspecified: Secondary | ICD-10-CM

## 2017-06-10 DIAGNOSIS — N939 Abnormal uterine and vaginal bleeding, unspecified: Secondary | ICD-10-CM

## 2017-06-10 DIAGNOSIS — Z91040 Latex allergy status: Secondary | ICD-10-CM | POA: Diagnosis not present

## 2017-06-10 DIAGNOSIS — F17290 Nicotine dependence, other tobacco product, uncomplicated: Secondary | ICD-10-CM | POA: Insufficient documentation

## 2017-06-10 LAB — CBC WITH DIFFERENTIAL/PLATELET
Basophils Absolute: 0 10*3/uL (ref 0.0–0.1)
Basophils Relative: 0 %
Eosinophils Absolute: 0 10*3/uL (ref 0.0–0.7)
Eosinophils Relative: 1 %
HEMATOCRIT: 40.1 % (ref 36.0–46.0)
HEMOGLOBIN: 12.7 g/dL (ref 12.0–15.0)
Lymphocytes Relative: 21 %
Lymphs Abs: 1.8 10*3/uL (ref 0.7–4.0)
MCH: 23.7 pg — AB (ref 26.0–34.0)
MCHC: 31.7 g/dL (ref 30.0–36.0)
MCV: 75 fL — ABNORMAL LOW (ref 78.0–100.0)
MONOS PCT: 8 %
Monocytes Absolute: 0.7 10*3/uL (ref 0.1–1.0)
NEUTROS ABS: 6 10*3/uL (ref 1.7–7.7)
NEUTROS PCT: 70 %
Platelets: 305 10*3/uL (ref 150–400)
RBC: 5.35 MIL/uL — ABNORMAL HIGH (ref 3.87–5.11)
RDW: 15.2 % (ref 11.5–15.5)
WBC: 8.6 10*3/uL (ref 4.0–10.5)

## 2017-06-10 LAB — URINALYSIS, ROUTINE W REFLEX MICROSCOPIC
BILIRUBIN URINE: NEGATIVE
Glucose, UA: NEGATIVE mg/dL
KETONES UR: NEGATIVE mg/dL
Leukocytes, UA: NEGATIVE
NITRITE: NEGATIVE
PH: 8.5 — AB (ref 5.0–8.0)
Specific Gravity, Urine: 1.015 (ref 1.005–1.030)

## 2017-06-10 LAB — URINALYSIS, MICROSCOPIC (REFLEX)

## 2017-06-10 LAB — COMPREHENSIVE METABOLIC PANEL
ALK PHOS: 97 U/L (ref 38–126)
ALT: 21 U/L (ref 14–54)
ANION GAP: 7 (ref 5–15)
AST: 25 U/L (ref 15–41)
Albumin: 4 g/dL (ref 3.5–5.0)
BILIRUBIN TOTAL: 0.4 mg/dL (ref 0.3–1.2)
BUN: 8 mg/dL (ref 6–20)
CALCIUM: 9.3 mg/dL (ref 8.9–10.3)
CO2: 27 mmol/L (ref 22–32)
Chloride: 106 mmol/L (ref 101–111)
Creatinine, Ser: 0.7 mg/dL (ref 0.44–1.00)
GLUCOSE: 102 mg/dL — AB (ref 65–99)
Potassium: 4 mmol/L (ref 3.5–5.1)
Sodium: 140 mmol/L (ref 135–145)
TOTAL PROTEIN: 7.8 g/dL (ref 6.5–8.1)

## 2017-06-10 LAB — WET PREP, GENITAL
Clue Cells Wet Prep HPF POC: NONE SEEN
Sperm: NONE SEEN
TRICH WET PREP: NONE SEEN
Yeast Wet Prep HPF POC: NONE SEEN

## 2017-06-10 LAB — LIPASE, BLOOD: Lipase: 29 U/L (ref 11–51)

## 2017-06-10 LAB — POC URINE PREG, ED: Preg Test, Ur: NEGATIVE

## 2017-06-10 MED ORDER — HYDROMORPHONE HCL 1 MG/ML IJ SOLN
1.0000 mg | Freq: Once | INTRAMUSCULAR | Status: AC
  Administered 2017-06-10: 1 mg via INTRAVENOUS
  Filled 2017-06-10: qty 1

## 2017-06-10 MED ORDER — SODIUM CHLORIDE 0.9 % IV BOLUS (SEPSIS)
1000.0000 mL | Freq: Once | INTRAVENOUS | Status: AC
  Administered 2017-06-10: 1000 mL via INTRAVENOUS

## 2017-06-10 MED ORDER — HYDROMORPHONE HCL 1 MG/ML IJ SOLN
1.0000 mg | Freq: Once | INTRAMUSCULAR | Status: AC
Start: 2017-06-10 — End: 2017-06-10
  Administered 2017-06-10: 1 mg via INTRAVENOUS
  Filled 2017-06-10: qty 1

## 2017-06-10 MED ORDER — OXYCODONE-ACETAMINOPHEN 5-325 MG PO TABS: 1.0000 | ORAL_TABLET | ORAL | 0 refills | Status: DC | PRN

## 2017-06-10 MED ORDER — ONDANSETRON HCL 4 MG PO TABS: 4.0000 mg | ORAL_TABLET | Freq: Three times a day (TID) | ORAL | 0 refills | Status: DC | PRN

## 2017-06-10 MED ORDER — ONDANSETRON HCL 4 MG/2ML IJ SOLN
4.0000 mg | Freq: Once | INTRAMUSCULAR | Status: AC
  Administered 2017-06-10: 4 mg via INTRAVENOUS
  Filled 2017-06-10: qty 2

## 2017-06-10 NOTE — Discharge Instructions (Signed)
Your workup today was reassuring and we did not find evidence of acute infection. Please follow-up with your OB/GYN for further management of your lower abdominal pain likely secondary to your ovarian cysts. Please use the pain and nausea medicine to help. If you begin developing any new or worsened symptoms, please return immediately to the nearest emergency department.

## 2017-06-10 NOTE — ED Triage Notes (Signed)
Per EMS pt from [facility] health center sent for pseudo seizures; hx of same. Pt was at [facility] center for abdominal pain related to hx of cyst/endometriosis.

## 2017-06-10 NOTE — ED Notes (Signed)
Bed: ZO10 Expected date:  Expected time:  Means of arrival:  Comments: EMS-pseudo-SZ/menstrual cramps

## 2017-06-10 NOTE — ED Provider Notes (Addendum)
[facility]-EMERGENCY DEPT Provider Note   CSN: [id number] Arrival date & time: 06/10/17  4098     History   Chief Complaint Chief Complaint  Patient presents with  . Seizures    HPI Ashlee Thompson is a 21 y.o. female.  The history is provided by the patient and medical records. No language interpreter was used.  Seizures   This is a chronic problem. The problem has been resolved. Associated symptoms include cough (dry), nausea and vomiting. Pertinent negatives include no headaches, no visual disturbance, no chest pain and no diarrhea.  Abdominal Pain   This is a recurrent problem. The current episode started 2 days ago. The problem occurs constantly. The problem has been gradually worsening. The pain is associated with an unknown factor. The pain is located in the RLQ, LLQ and suprapubic region. The quality of the pain is aching and cramping. The pain is at a severity of 10/10 (30/10). The pain is severe. Associated symptoms include nausea and vomiting. Pertinent negatives include fever, diarrhea, constipation, dysuria, frequency and headaches. The symptoms are aggravated by palpation. Nothing relieves the symptoms. Past medical history comments: endometriosis and ovarian cysts.    Past Medical History:  Diagnosis Date  . Anxiety   . Bilateral ovarian cysts   . Diabetes mellitus without complication (HCC)   . GI bleed due to NSAIDs   . Myocardial infarction (HCC)   . Pseudoseizures     Patient Active Problem List   Diagnosis Date Noted  . Headache   . UGI bleed 07/07/2016  . History of diabetes mellitus 07/07/2016  . Morbid obesity (HCC) 07/07/2016  . Hematemesis with nausea 07/07/2016  . GI bleed 07/07/2016    Past Surgical History:  Procedure Laterality Date  . ESOPHAGOGASTRODUODENOSCOPY N/A 07/07/2016   Procedure: ESOPHAGOGASTRODUODENOSCOPY (EGD);  Surgeon: Ruffin Frederick, MD;  Location: [facility] ENDOSCOPY;  Service: Gastroenterology;  Laterality: N/A;    OB History     Gravida Para Term Preterm AB Living   0 0 0 0 0 0   SAB TAB Ectopic Multiple Live Births   0 0 0 0 0       Home Medications    Prior to Admission medications   Medication Sig Start Date End Date Taking? Authorizing Provider  linaclotide Karlene Einstein) 145 MCG CAPS capsule Take 1 capsule (145 mcg total) by mouth daily before breakfast. Patient not taking: Reported on 04/13/2017 03/27/17   Alvira Monday, MD  naproxen (NAPROSYN) 500 MG tablet Take 1 tablet (500 mg total) by mouth 2 (two) times daily with a meal. 04/13/17   Muthersbaugh, Dahlia Client, PA-C  ondansetron (ZOFRAN ODT) 4 MG disintegrating tablet  ODT q4 hours prn nausea/vomit 04/13/17   Muthersbaugh, Dahlia Client, PA-C    Family History Family History  Problem Relation Age of Onset  . Hyperlipidemia Mother   . Mental illness Mother   . Hyperlipidemia Father   . Mental illness Brother   . Heart disease Paternal Grandfather   . Hyperlipidemia Paternal Grandfather   . Hypertension Paternal Grandfather   . CAD Other   . Cancer Other     Social History Social History  Substance Use Topics  . Smoking status: Current Some Day Smoker    Packs/day: 0.25    Types: Cigars  . Smokeless tobacco: Never Used  . Alcohol use Yes     Comment: social     Allergies   Bentyl [dicyclomine hcl]; Haldol [haloperidol lactate]; Tramadol; and Latex   Review of Systems Review of Systems  Constitutional: Negative for chills, diaphoresis, fatigue and fever.  HENT: Positive for congestion.   Eyes: Negative for visual disturbance.  Respiratory: Positive for cough (dry). Negative for chest tightness, shortness of breath, wheezing and stridor.   Cardiovascular: Negative for chest pain and leg swelling.  Gastrointestinal: Positive for abdominal pain, nausea and vomiting. Negative for abdominal distention, constipation and diarrhea.  Genitourinary: Positive for pelvic pain and vaginal bleeding. Negative for difficulty urinating, dysuria, flank pain,  frequency and vaginal discharge.  Musculoskeletal: Negative for back pain and neck pain.  Skin: Negative for rash and wound.  Neurological: Positive for seizures. Negative for light-headedness and headaches.  Psychiatric/Behavioral: Negative for agitation.  All other systems reviewed and are negative.    Physical Exam Updated Vital Signs BP 131/78 (BP Location: Right Arm)   Pulse (!) 53   Temp 97.9 F (36.6 C) (Oral)   Resp 18   SpO2 100%   Physical Exam  Constitutional: She appears well-developed and well-nourished. No distress.  HENT:  Head: Normocephalic.  Mouth/Throat: Oropharynx is clear and moist. No oropharyngeal exudate.  Eyes: Pupils are equal, round, and reactive to light. Conjunctivae and EOM are normal.  Neck: Normal range of motion.  Cardiovascular: Normal rate.   No murmur heard. Pulmonary/Chest: No stridor. No respiratory distress. She has no wheezes. She has no rales. She exhibits no tenderness.  Abdominal: Soft. Bowel sounds are normal. There is tenderness. There is no guarding.  Genitourinary: Cervix exhibits no motion tenderness, no discharge and no friability. Right adnexum displays no tenderness. Left adnexum displays no tenderness. There is bleeding in the vagina. No tenderness in the vagina.  Genitourinary Comments: Vaginal bleeding present. No discharge. Mild tenderness in the groin but no specific CMT and no discrete adnexal tenderness.  Musculoskeletal: She exhibits no tenderness.  Neurological: No sensory deficit. She exhibits normal muscle tone.  Skin: Capillary refill takes less than 2 seconds. No rash noted. She is not diaphoretic. No erythema.  Psychiatric: She has a normal mood and affect.  Nursing note and vitals reviewed.    ED Treatments / Results  Labs (all labs ordered are listed, but only abnormal results are displayed) Labs Reviewed  WET PREP, GENITAL - Abnormal; Notable for the following:       Result Value   WBC, Wet Prep HPF POC  FEW (*)    All other components within normal limits  CBC WITH DIFFERENTIAL/PLATELET - Abnormal; Notable for the following:    RBC 5.35 (*)    MCV 75.0 (*)    MCH 23.7 (*)    All other components within normal limits  COMPREHENSIVE METABOLIC PANEL - Abnormal; Notable for the following:    Glucose, Bld 102 (*)    All other components within normal limits  URINALYSIS, ROUTINE W REFLEX MICROSCOPIC - Abnormal; Notable for the following:    APPearance CLOUDY (*)    pH 8.5 (*)    Hgb urine dipstick LARGE (*)    Protein, ur TRACE (*)    All other components within normal limits  URINALYSIS, MICROSCOPIC (REFLEX) - Abnormal; Notable for the following:    Bacteria, UA FEW (*)    Squamous Epithelial / LPF 0-5 (*)    All other components within normal limits  URINE CULTURE  LIPASE, BLOOD  POC URINE PREG, ED  GC/CHLAMYDIA PROBE AMP ([employer]) NOT AT [facility]    EKG  EKG Interpretation None       Radiology US Transvaginal Non-ob  Result Date: 06/10/2017 CLINICAL DATA:  Acute onset of mid to left-sided pelvic pain. Personal history of ovarian cysts. EXAM: TRANSABDOMINAL AND TRANSVAGINAL ULTRASOUND OF PELVIS DOPPLER ULTRASOUND OF OVARIES TECHNIQUE: Both transabdominal and transvaginal ultrasound examinations of the pelvis were performed. Transabdominal technique was performed for global imaging of the pelvis including uterus, ovaries, adnexal regions, and pelvic cul-de-sac. It was necessary to proceed with endovaginal exam following the transabdominal exam to visualize the endometrium and ovaries comet due to incomplete bladder distension. Color and duplex Doppler ultrasound was utilized to evaluate blood flow to the ovaries. COMPARISON:  CT abdomen and pelvis 03/27/2017, 05/04/2016 and earlier. Pelvic ultrasound 10/30/2016, 07/17/2016 and earlier. FINDINGS: Uterus Measurements: Approximately 7.4 x 3.5 x 3.9 cm. Homogeneous echotexture without focal fibroid or other myometrial abnormality.  Normal-appearing uterine cervix. Endometrium Thickness: Approximately 6 mm. Normal appearance without evidence of endometrial fluid or mass. Right ovary Measurements: Approximately 3.6 x 1.5 x 1.2 cm. Small follicular cysts. No dominant cyst or solid mass. Normal color Doppler flow within the ovary. Left ovary Measurements: Approximately 4.0 x 3.0 x 3.5 cm. Benign simple cyst measuring approximately 3.0 x 2.2 x 3.2 cm. No solid mass. Normal color Doppler flow within the ovary. Pulsed Doppler evaluation of both ovaries demonstrates normal low-resistance arterial and venous waveforms. Other findings Minimal free fluid in the cul de sac. IMPRESSION: 1. Approximate 3 cm benign simple cyst involving the left ovary. 2. Otherwise normal examination. Electronically Signed   By: Hulan Saas M.D.   On: 06/10/2017 13:42   US Pelvis Complete  Result Date: 06/10/2017 CLINICAL DATA:  Acute onset of mid to left-sided pelvic pain. Personal history of ovarian cysts. EXAM: TRANSABDOMINAL AND TRANSVAGINAL ULTRASOUND OF PELVIS DOPPLER ULTRASOUND OF OVARIES TECHNIQUE: Both transabdominal and transvaginal ultrasound examinations of the pelvis were performed. Transabdominal technique was performed for global imaging of the pelvis including uterus, ovaries, adnexal regions, and pelvic cul-de-sac. It was necessary to proceed with endovaginal exam following the transabdominal exam to visualize the endometrium and ovaries comet due to incomplete bladder distension. Color and duplex Doppler ultrasound was utilized to evaluate blood flow to the ovaries. COMPARISON:  CT abdomen and pelvis 03/27/2017, 05/04/2016 and earlier. Pelvic ultrasound 10/30/2016, 07/17/2016 and earlier. FINDINGS: Uterus Measurements: Approximately 7.4 x 3.5 x 3.9 cm. Homogeneous echotexture without focal fibroid or other myometrial abnormality. Normal-appearing uterine cervix. Endometrium Thickness: Approximately 6 mm. Normal appearance without evidence of  endometrial fluid or mass. Right ovary Measurements: Approximately 3.6 x 1.5 x 1.2 cm. Small follicular cysts. No dominant cyst or solid mass. Normal color Doppler flow within the ovary. Left ovary Measurements: Approximately 4.0 x 3.0 x 3.5 cm. Benign simple cyst measuring approximately 3.0 x 2.2 x 3.2 cm. No solid mass. Normal color Doppler flow within the ovary. Pulsed Doppler evaluation of both ovaries demonstrates normal low-resistance arterial and venous waveforms. Other findings Minimal free fluid in the cul de sac. IMPRESSION: 1. Approximate 3 cm benign simple cyst involving the left ovary. 2. Otherwise normal examination. Electronically Signed   By: Hulan Saas M.D.   On: 06/10/2017 13:42   [place] Art/ven Flow Abd Pelv Doppler  Result Date: 06/10/2017 CLINICAL DATA:  Acute onset of mid to left-sided pelvic pain. Personal history of ovarian cysts. EXAM: TRANSABDOMINAL AND TRANSVAGINAL ULTRASOUND OF PELVIS DOPPLER ULTRASOUND OF OVARIES TECHNIQUE: Both transabdominal and transvaginal ultrasound examinations of the pelvis were performed. Transabdominal technique was performed for global imaging of the pelvis including uterus, ovaries, adnexal regions, and pelvic cul-de-sac. It was necessary to proceed with endovaginal exam following  the transabdominal exam to visualize the endometrium and ovaries comet due to incomplete bladder distension. Color and duplex Doppler ultrasound was utilized to evaluate blood flow to the ovaries. COMPARISON:  CT abdomen and pelvis 03/27/2017, 05/04/2016 and earlier. Pelvic ultrasound 10/30/2016, 07/17/2016 and earlier. FINDINGS: Uterus Measurements: Approximately 7.4 x 3.5 x 3.9 cm. Homogeneous echotexture without focal fibroid or other myometrial abnormality. Normal-appearing uterine cervix. Endometrium Thickness: Approximately 6 mm. Normal appearance without evidence of endometrial fluid or mass. Right ovary Measurements: Approximately 3.6 x 1.5 x 1.2 cm. Small follicular  cysts. No dominant cyst or solid mass. Normal color Doppler flow within the ovary. Left ovary Measurements: Approximately 4.0 x 3.0 x 3.5 cm. Benign simple cyst measuring approximately 3.0 x 2.2 x 3.2 cm. No solid mass. Normal color Doppler flow within the ovary. Pulsed Doppler evaluation of both ovaries demonstrates normal low-resistance arterial and venous waveforms. Other findings Minimal free fluid in the cul de sac. IMPRESSION: 1. Approximate 3 cm benign simple cyst involving the left ovary. 2. Otherwise normal examination. Electronically Signed   By: Hulan Saas M.D.   On: 06/10/2017 13:42    Procedures Procedures (including critical care time)  Medications Ordered in ED Medications  sodium chloride 0.9 % bolus 1,000 mL (0 mLs Intravenous Stopped 06/10/17 1122)  HYDROmorphone (DILAUDID) injection 1 mg (1 mg Intravenous Given 06/10/17 1030)  ondansetron (ZOFRAN) injection 4 mg (4 mg Intravenous Given 06/10/17 1031)  HYDROmorphone (DILAUDID) injection 1 mg (1 mg Intravenous Given 06/10/17 1158)  HYDROmorphone (DILAUDID) injection 1 mg (1 mg Intravenous Given 06/10/17 1553)     Initial Impression / Assessment and Plan / ED Course  I have reviewed the triage vital signs and the nursing notes.  Pertinent labs & imaging results that were available during my care of the patient were reviewed by me and considered in my medical decision making (see chart for details).     Ashlee Thompson is a 21 y.o. female with a past medical history significant for diabetes, prior GI bleed, ovarian cysts, report of endometriosis, and non-epileptiform pseudoseizures who presents from [facility] for abdominal pain and seizure-like activity. Patient says that her shaking was due to pseudoseizures when she has pain or is stressed out. She says that she does not want to focus on that today but instead was to focus on the abdominal pain. She says this is "the same" as her last episode of ruptured ovarian  cyst/endometriosis. She says this is how he presented several months ago. She does report that the last 2 days she has had cramping dull and also sharp lower abdominal pain across her lower abdomen. She reports that she had vaginal bleeding today with it. She describes her pain as "30 out of 10". She has not been able to take medicine for her symptoms. She went to student health for evaluation and they sent her here.  Patient denies recent fevers, chills, chest pain, or shortness of breath. She does report a dry cough and some congestion over the last few days. She reports one episode of nausea and vomiting but otherwise denies constipation, diarrhea, or urinary symptoms. Patient says that she is not pregnant.  On exam, patient has lower abdominal tenderness. Patient had no CVA tenderness. No upper abdominal tenderness. Lungs clear. Rhinorrhea and congestion present.  Patient will have pelvic exam and pelvic ultrasound ordered. She'll be given pain medicine, nausea medicine, and fluids during initial workup. She'll have laboratory testing. Given the similar to prior episodes, suspect  this is related to her ovarian cysts and/or endometriosis.     Diagnostic testing seen above. Pregnancy test negative. Wet prep only had white blood cells, no BV. Lipase nonelevated. CMP reassuring. CBC shows no anemia. No leukocytosis. Urinalysis showed bleeding but otherwise no evidence of infection.  Pelvic ultrasound was ordered showing no evidence of TOA and no evidence of torsion. Ovarian cyst was present. Free fluid in the cul-de-sac also seen. Given patient's report that this is similar to prior record hemorrhagic cyst, suspect this is a similar episode. Patient also maybe having pain with menstrual cycle given her possible endometriosis history.  Given absence of CMT or severe adnexal tenderness, doubt PID. If Patient's GC/chlamydia test returned positive, she will need antibiotics.  Patient felt much better  after pain medicine nausea medicine. Patient felt reassured based on workup. Patient will follow up with her OB/GYN and use pain and nausea medicine to help her symptoms. Do not feel patient needs further imaging at this time. Patient had continued improvement in symptoms. Patient discharged in good condition with understanding return precautions.      Final Clinical Impressions(s) / ED Diagnoses   Final diagnoses:  Lower abdominal pain  Vaginal bleeding    New Prescriptions Discharge Medication List as of 06/10/2017  4:15 PM    START taking these medications   Details  ondansetron (ZOFRAN) 4 MG tablet Take 1 tablet (4 mg total) by mouth every 8 (eight) hours as needed for nausea or vomiting., Starting Mon 06/10/2017, Print    oxyCODONE-acetaminophen (PERCOCET/ROXICET) 5-325 MG tablet Take 1 tablet by mouth every 4 (four) hours as needed for severe pain., Starting Mon 06/10/2017, Print       . Clinical Impression: 1. Lower abdominal pain   2. Vaginal bleeding     Disposition: Discharge  Condition: Good  I have discussed the results, Dx and Tx plan with the pt(& family if present). He/she/they expressed understanding and agree(s) with the plan. Discharge instructions discussed at great length. Strict return precautions discussed and pt &/or family have verbalized understanding of the instructions. No further questions at time of discharge.    Discharge Medication List as of 06/10/2017  4:15 PM    START taking these medications   Details  ondansetron (ZOFRAN) 4 MG tablet Take 1 tablet (4 mg total) by mouth every 8 (eight) hours as needed for nausea or vomiting., Starting Mon 06/10/2017, Print    oxyCODONE-acetaminophen (PERCOCET/ROXICET) 5-325 MG tablet Take 1 tablet by mouth every 4 (four) hours as needed for severe pain., Starting Mon 06/10/2017, Print        Follow Up: [facility] [address] [place] [address] [phone] Schedule an  appointment as soon as possible for a visit    [facility] [address] [place] [address] [phone]     [facility] [address] [phone] mc [address] [place] [address] [phone]  If symptoms worsen     Tegeler, Canary Brim, MD 06/10/17 1949    Tegeler, Canary Brim, MD 06/10/17 1950

## 2017-06-11 ENCOUNTER — Emergency Department (HOSPITAL_COMMUNITY): Payer: BLUE CROSS/BLUE SHIELD

## 2017-06-11 ENCOUNTER — Encounter (HOSPITAL_COMMUNITY): Payer: Self-pay | Admitting: Emergency Medicine

## 2017-06-11 ENCOUNTER — Emergency Department (HOSPITAL_COMMUNITY)
Admission: EM | Admit: 2017-06-11 | Discharge: 2017-06-11 | Disposition: A | Discharge status: Home / Self Care | Payer: BLUE CROSS/BLUE SHIELD | Attending: Emergency Medicine | Admitting: Emergency Medicine

## 2017-06-11 DIAGNOSIS — N83202 Unspecified ovarian cyst, left side: Secondary | ICD-10-CM | POA: Diagnosis not present

## 2017-06-11 DIAGNOSIS — E119 Type 2 diabetes mellitus without complications: Secondary | ICD-10-CM | POA: Insufficient documentation

## 2017-06-11 DIAGNOSIS — Z91040 Latex allergy status: Secondary | ICD-10-CM | POA: Insufficient documentation

## 2017-06-11 DIAGNOSIS — F17290 Nicotine dependence, other tobacco product, uncomplicated: Secondary | ICD-10-CM | POA: Insufficient documentation

## 2017-06-11 DIAGNOSIS — R1032 Left lower quadrant pain: Secondary | ICD-10-CM | POA: Diagnosis present

## 2017-06-11 LAB — URINE CULTURE

## 2017-06-11 LAB — CBC
HEMATOCRIT: 37 % (ref 36.0–46.0)
Hemoglobin: 11.9 g/dL — ABNORMAL LOW (ref 12.0–15.0)
MCH: 23.8 pg — ABNORMAL LOW (ref 26.0–34.0)
MCHC: 32.2 g/dL (ref 30.0–36.0)
MCV: 73.9 fL — AB (ref 78.0–100.0)
PLATELETS: 277 10*3/uL (ref 150–400)
RBC: 5.01 MIL/uL (ref 3.87–5.11)
RDW: 15.1 % (ref 11.5–15.5)
WBC: 7.1 10*3/uL (ref 4.0–10.5)

## 2017-06-11 LAB — URINALYSIS, ROUTINE W REFLEX MICROSCOPIC
BACTERIA UA: NONE SEEN
BILIRUBIN URINE: NEGATIVE
Glucose, UA: NEGATIVE mg/dL
KETONES UR: 20 mg/dL — AB
LEUKOCYTES UA: NEGATIVE
NITRITE: NEGATIVE
PH: 7 (ref 5.0–8.0)
Protein, ur: NEGATIVE mg/dL
SPECIFIC GRAVITY, URINE: 1.012 (ref 1.005–1.030)

## 2017-06-11 LAB — COMPREHENSIVE METABOLIC PANEL
ALT: 18 U/L (ref 14–54)
AST: 17 U/L (ref 15–41)
Albumin: 3.9 g/dL (ref 3.5–5.0)
Alkaline Phosphatase: 95 U/L (ref 38–126)
Anion gap: 8 (ref 5–15)
BILIRUBIN TOTAL: 0.3 mg/dL (ref 0.3–1.2)
BUN: 6 mg/dL (ref 6–20)
CO2: 25 mmol/L (ref 22–32)
Calcium: 9.4 mg/dL (ref 8.9–10.3)
Chloride: 104 mmol/L (ref 101–111)
Creatinine, Ser: 0.67 mg/dL (ref 0.44–1.00)
Glucose, Bld: 88 mg/dL (ref 65–99)
Potassium: 4 mmol/L (ref 3.5–5.1)
Sodium: 137 mmol/L (ref 135–145)
TOTAL PROTEIN: 7.4 g/dL (ref 6.5–8.1)

## 2017-06-11 LAB — GC/CHLAMYDIA PROBE AMP (~~LOC~~) NOT AT ARMC
CHLAMYDIA, DNA PROBE: NEGATIVE
NEISSERIA GONORRHEA: NEGATIVE

## 2017-06-11 LAB — I-STAT BETA HCG BLOOD, ED (MC, WL, AP ONLY): I-stat hCG, quantitative: <5 m[IU]/mL (ref <5)

## 2017-06-11 LAB — LIPASE, BLOOD: LIPASE: 29 U/L (ref 11–51)

## 2017-06-11 MED ORDER — KETOROLAC TROMETHAMINE 15 MG/ML IJ SOLN
15.0000 mg | Freq: Once | INTRAMUSCULAR | Status: AC
  Administered 2017-06-11: 15 mg via INTRAVENOUS
  Filled 2017-06-11: qty 1

## 2017-06-11 MED ORDER — ONDANSETRON 4 MG PO TBDP: 4.0000 mg | ORAL_TABLET | Freq: Three times a day (TID) | ORAL | 0 refills | Status: DC | PRN

## 2017-06-11 MED ORDER — MORPHINE SULFATE (PF) 4 MG/ML IV SOLN
4.0000 mg | Freq: Once | INTRAVENOUS | Status: AC
  Administered 2017-06-11: 4 mg via INTRAVENOUS
  Filled 2017-06-11: qty 1

## 2017-06-11 MED ORDER — DIPHENHYDRAMINE HCL 50 MG/ML IJ SOLN
25.0000 mg | Freq: Once | INTRAMUSCULAR | Status: AC
  Administered 2017-06-11: 25 mg via INTRAVENOUS
  Filled 2017-06-11: qty 1

## 2017-06-11 MED ORDER — SODIUM CHLORIDE 0.9 % IV BOLUS (SEPSIS)
500.0000 mL | Freq: Once | INTRAVENOUS | Status: AC
  Administered 2017-06-11: 500 mL via INTRAVENOUS

## 2017-06-11 MED ORDER — ONDANSETRON HCL 4 MG/2ML IJ SOLN
4.0000 mg | Freq: Once | INTRAMUSCULAR | Status: AC
  Administered 2017-06-11: 4 mg via INTRAVENOUS
  Filled 2017-06-11: qty 2

## 2017-06-11 NOTE — ED Triage Notes (Signed)
Patient c/o right side cyst ruptured today with RLQ ain and vaginal bleeding and recently had left sided cyst ruptured. Patient also having pseudo seizures while being triaged, but patient is alert and oriented to answer triage questions when asked.

## 2017-06-11 NOTE — ED Notes (Signed)
Will collect vitals when pt. Returns. Nurse aware.

## 2017-06-11 NOTE — ED Notes (Signed)
Patient transported to Ultrasound 

## 2017-06-11 NOTE — ED Provider Notes (Signed)
[facility]-EMERGENCY DEPT Provider Note   CSN: [id number] Arrival date & time: 06/11/17  1419     History   Chief Complaint Chief Complaint  Patient presents with  . Abdominal Pain  . Vaginal Bleeding  . pseudo seizures    HPI Ashlee Thompson is a 21 y.o. female with hx Significant for endometriosis and ovarian cyst presenting with worsening Lower abdominal pain. She was seen yesterday in the emergency department for right lower quadrant pain and ultrasound showed a left-sided cyst and free fluid in the pelvis. She states that the pain feels just like when she had a ruptured ovarian cyst in the past. She has a constant pain and gets sharp shooting pains that subside intermittently. She returned because the pain is persistent and worsening and now located in the left lower quadrant radiating to her back. She also has associated nausea but no vomiting. Reports vaginal bleeding, no dysuria, hematuria, difficulty urinating, fever, chills or other symptoms. Normal bowel movement this morning. She reports that she has contacted her GYN could not get an appointment for a week and was told to return if she worsened. She was prescribed the pill form of zofran and prefers odt, so she did not take anything for her symptoms at home.  LMP was 05/15/2017 and patient stating that she typically gets a cycle every other month on nexplanon and doesn't believe she is currently starting a menstrual cycle. She reports heavy cycles. HPI  Past Medical History:  Diagnosis Date  . Anxiety   . Bilateral ovarian cysts   . Diabetes mellitus without complication (HCC)   . GI bleed due to NSAIDs   . Myocardial infarction (HCC)   . Pseudoseizures     Patient Active Problem List   Diagnosis Date Noted  . Headache   . UGI bleed 07/07/2016  . History of diabetes mellitus 07/07/2016  . Morbid obesity (HCC) 07/07/2016  . Hematemesis with nausea 07/07/2016  . GI bleed 07/07/2016    Past Surgical History:  Procedure  Laterality Date  . ESOPHAGOGASTRODUODENOSCOPY N/A 07/07/2016   Procedure: ESOPHAGOGASTRODUODENOSCOPY (EGD);  Surgeon: Ruffin Frederick, MD;  Location: [facility] ENDOSCOPY;  Service: Gastroenterology;  Laterality: N/A;    OB History    Gravida Para Term Preterm AB Living   0 0 0 0 0 0   SAB TAB Ectopic Multiple Live Births   0 0 0 0 0       Home Medications    Prior to Admission medications   Medication Sig Start Date End Date Taking? Authorizing Provider  amoxicillin (AMOXIL) 875 MG tablet Take 1 tablet by mouth 2 (two) times daily. 06/05/17  Yes [provider]  omeprazole (PRILOSEC) 40 MG capsule Take 40 mg by mouth daily. 06/08/17  Yes [provider]  ondansetron (ZOFRAN) 4 MG tablet Take 1 tablet (4 mg total) by mouth every 8 (eight) hours as needed for nausea or vomiting. 06/10/17  Yes Tegeler, Canary Brim, MD  oxyCODONE-acetaminophen (PERCOCET/ROXICET) 5-325 MG tablet Take 1 tablet by mouth every 4 (four) hours as needed for severe pain. 06/10/17  Yes Tegeler, Canary Brim, MD  linaclotide Karlene Einstein) 145 MCG CAPS capsule Take 1 capsule (145 mcg total) by mouth daily before breakfast. Patient not taking: Reported on 04/13/2017 03/27/17   Alvira Monday, MD  naproxen (NAPROSYN) 500 MG tablet Take 1 tablet (500 mg total) by mouth 2 (two) times daily with a meal. Patient not taking: Reported on 06/10/2017 04/13/17   Muthersbaugh, Dahlia Client, PA-C  ondansetron [facility]  ODT) 4 MG disintegrating tablet Take 1 tablet (4 mg total) by mouth every 8 (eight) hours as needed for nausea or vomiting. 06/11/17   Georgiana Shore, PA-C    Family History Family History  Problem Relation Age of Onset  . Hyperlipidemia Mother   . Mental illness Mother   . Hyperlipidemia Father   . Mental illness Brother   . Heart disease Paternal Grandfather   . Hyperlipidemia Paternal Grandfather   . Hypertension Paternal Grandfather   . CAD Other   . Cancer Other     Social History Social  History  Substance Use Topics  . Smoking status: Current Some Day Smoker    Packs/day: 0.25    Types: Cigars  . Smokeless tobacco: Never Used  . Alcohol use Yes     Comment: social     Allergies   Bentyl [dicyclomine hcl]; Haldol [haloperidol lactate]; Tramadol; and Latex   Review of Systems Review of Systems  Constitutional: Negative for chills and fever.  HENT: Negative for ear pain and sore throat.   Eyes: Negative for pain and visual disturbance.  Respiratory: Negative for cough, shortness of breath, wheezing and stridor.   Cardiovascular: Negative for chest pain and palpitations.  Gastrointestinal: Positive for abdominal pain and nausea. Negative for abdominal distention, blood in stool, diarrhea and vomiting.  Genitourinary: Positive for flank pain, pelvic pain and vaginal bleeding. Negative for decreased urine volume, difficulty urinating, dysuria and hematuria.  Musculoskeletal: Negative for arthralgias, back pain, myalgias, neck pain and neck stiffness.  Skin: Negative for color change, pallor and rash.  Neurological: Negative for dizziness, light-headedness and headaches.     Physical Exam Updated Vital Signs BP 128/62 (BP Location: Right Arm)   Pulse 72   Temp 98.9 F (37.2 C) (Oral)   Resp 20   SpO2 100%   Physical Exam  Constitutional: She appears well-developed and well-nourished. No distress.  Afebrile, nontoxic-appearing, lying in bed in discomfort no acute distress.  HENT:  Head: Normocephalic and atraumatic.  Eyes: Conjunctivae and EOM are normal. Right eye exhibits no discharge. Left eye exhibits no discharge. No scleral icterus.  Neck: Normal range of motion. Neck supple.  Cardiovascular: Normal rate, regular rhythm and normal heart sounds.   No murmur heard. Pulmonary/Chest: Effort normal and breath sounds normal. No respiratory distress. She has no wheezes. She has no rales.  Abdominal: Soft. She exhibits no distension and no mass. There is  tenderness. There is no guarding.  Flat contour, active bowel sounds. abdomen is supple and without focal tenderness to palpation. Generalized discomfort with any palpation. No palpated masses. No costovertebral angle tenderness. Negative murphy's sign Negative McBurney's point tenderness   Musculoskeletal: She exhibits no edema.  Neurological: She is alert.  Skin: Skin is warm and dry. No rash noted. She is not diaphoretic. No erythema. No pallor.  Psychiatric: She has a normal mood and affect.  Nursing note and vitals reviewed.    ED Treatments / Results  Labs (all labs ordered are listed, but only abnormal results are displayed) Labs Reviewed  CBC - Abnormal; Notable for the following:       Result Value   Hemoglobin 11.9 (*)    MCV 73.9 (*)    MCH 23.8 (*)    All other components within normal limits  URINALYSIS, ROUTINE W REFLEX MICROSCOPIC - Abnormal; Notable for the following:    Color, Urine STRAW (*)    Hgb urine dipstick MODERATE (*)    Ketones, ur 20 (*)  Squamous Epithelial / LPF 0-5 (*)    All other components within normal limits  LIPASE, BLOOD  COMPREHENSIVE METABOLIC PANEL  I-STAT BETA HCG BLOOD, ED ([facility], [facility], [facility] ONLY)    EKG  EKG Interpretation None       Radiology US Transvaginal Non-ob  Result Date: 06/11/2017 CLINICAL DATA:  Sharp left lower quadrant pain.  Known ovary cyst. EXAM: TRANSVAGINAL ULTRASOUND OF PELVIS DOPPLER ULTRASOUND OF OVARIES TECHNIQUE: Transvaginal ultrasound examination of the pelvis was performed including evaluation of the uterus, ovaries, adnexal regions, and pelvic cul-de-sac. Color and duplex Doppler ultrasound was utilized to evaluate blood flow to the ovaries. COMPARISON:  None. FINDINGS: Uterus Measurements: 7.3 x 3.7 x 4.7 cm. No fibroids or other mass visualized. Endometrium Thickness: 3.9 mm. No focal abnormality visualized. Right ovary Measurements: 3.5 x 1.1 x 1.5 cm. Normal appearance/no adnexal mass. Left ovary  Measurements: 3.6 x 3.1 x 2.9 cm. 3 cm simple appear cyst is again noted. Pulsed Doppler evaluation demonstrates normal low-resistance arterial and venous waveforms in both ovaries. IMPRESSION: 1. 3 cm simple appearing left ovary cyst is identified. This is almost certainly benign, and no specific imaging follow up is recommended according to the Society of Radiologists in Ultrasound2010 Consensus Conference Statement (D Lenis Noon et al. Management of Asymptomatic Ovarian and Other Adnexal Cysts Imaged at [place]: Society of Radiologists in Ultrasound Consensus Conference Statement 2010. Radiology 256 (Sept 2010): 943-954.). 2. No evidence for ovarian torsion. Electronically Signed   By: Signa Kell M.D.   On: 06/11/2017 19:03   US Transvaginal Non-ob  Result Date: 06/10/2017 CLINICAL DATA:  Acute onset of mid to left-sided pelvic pain. Personal history of ovarian cysts. EXAM: TRANSABDOMINAL AND TRANSVAGINAL ULTRASOUND OF PELVIS DOPPLER ULTRASOUND OF OVARIES TECHNIQUE: Both transabdominal and transvaginal ultrasound examinations of the pelvis were performed. Transabdominal technique was performed for global imaging of the pelvis including uterus, ovaries, adnexal regions, and pelvic cul-de-sac. It was necessary to proceed with endovaginal exam following the transabdominal exam to visualize the endometrium and ovaries comet due to incomplete bladder distension. Color and duplex Doppler ultrasound was utilized to evaluate blood flow to the ovaries. COMPARISON:  CT abdomen and pelvis 03/27/2017, 05/04/2016 and earlier. Pelvic ultrasound 10/30/2016, 07/17/2016 and earlier. FINDINGS: Uterus Measurements: Approximately 7.4 x 3.5 x 3.9 cm. Homogeneous echotexture without focal fibroid or other myometrial abnormality. Normal-appearing uterine cervix. Endometrium Thickness: Approximately 6 mm. Normal appearance without evidence of endometrial fluid or mass. Right ovary Measurements: Approximately 3.6 x 1.5 x 1.2 cm. Small  follicular cysts. No dominant cyst or solid mass. Normal color Doppler flow within the ovary. Left ovary Measurements: Approximately 4.0 x 3.0 x 3.5 cm. Benign simple cyst measuring approximately 3.0 x 2.2 x 3.2 cm. No solid mass. Normal color Doppler flow within the ovary. Pulsed Doppler evaluation of both ovaries demonstrates normal low-resistance arterial and venous waveforms. Other findings Minimal free fluid in the cul de sac. IMPRESSION: 1. Approximate 3 cm benign simple cyst involving the left ovary. 2. Otherwise normal examination. Electronically Signed   By: Hulan Saas M.D.   On: 06/10/2017 13:42   US Pelvis Complete  Result Date: 06/10/2017 CLINICAL DATA:  Acute onset of mid to left-sided pelvic pain. Personal history of ovarian cysts. EXAM: TRANSABDOMINAL AND TRANSVAGINAL ULTRASOUND OF PELVIS DOPPLER ULTRASOUND OF OVARIES TECHNIQUE: Both transabdominal and transvaginal ultrasound examinations of the pelvis were performed. Transabdominal technique was performed for global imaging of the pelvis including uterus, ovaries, adnexal regions, and pelvic cul-de-sac. It was necessary to  proceed with endovaginal exam following the transabdominal exam to visualize the endometrium and ovaries comet due to incomplete bladder distension. Color and duplex Doppler ultrasound was utilized to evaluate blood flow to the ovaries. COMPARISON:  CT abdomen and pelvis 03/27/2017, 05/04/2016 and earlier. Pelvic ultrasound 10/30/2016, 07/17/2016 and earlier. FINDINGS: Uterus Measurements: Approximately 7.4 x 3.5 x 3.9 cm. Homogeneous echotexture without focal fibroid or other myometrial abnormality. Normal-appearing uterine cervix. Endometrium Thickness: Approximately 6 mm. Normal appearance without evidence of endometrial fluid or mass. Right ovary Measurements: Approximately 3.6 x 1.5 x 1.2 cm. Small follicular cysts. No dominant cyst or solid mass. Normal color Doppler flow within the ovary. Left ovary Measurements:  Approximately 4.0 x 3.0 x 3.5 cm. Benign simple cyst measuring approximately 3.0 x 2.2 x 3.2 cm. No solid mass. Normal color Doppler flow within the ovary. Pulsed Doppler evaluation of both ovaries demonstrates normal low-resistance arterial and venous waveforms. Other findings Minimal free fluid in the cul de sac. IMPRESSION: 1. Approximate 3 cm benign simple cyst involving the left ovary. 2. Otherwise normal examination. Electronically Signed   By: Hulan Saas M.D.   On: 06/10/2017 13:42   [place] Art/ven Flow Abd Pelv Doppler  Result Date: 06/11/2017 CLINICAL DATA:  Sharp left lower quadrant pain.  Known ovary cyst. EXAM: TRANSVAGINAL ULTRASOUND OF PELVIS DOPPLER ULTRASOUND OF OVARIES TECHNIQUE: Transvaginal ultrasound examination of the pelvis was performed including evaluation of the uterus, ovaries, adnexal regions, and pelvic cul-de-sac. Color and duplex Doppler ultrasound was utilized to evaluate blood flow to the ovaries. COMPARISON:  None. FINDINGS: Uterus Measurements: 7.3 x 3.7 x 4.7 cm. No fibroids or other mass visualized. Endometrium Thickness: 3.9 mm. No focal abnormality visualized. Right ovary Measurements: 3.5 x 1.1 x 1.5 cm. Normal appearance/no adnexal mass. Left ovary Measurements: 3.6 x 3.1 x 2.9 cm. 3 cm simple appear cyst is again noted. Pulsed Doppler evaluation demonstrates normal low-resistance arterial and venous waveforms in both ovaries. IMPRESSION: 1. 3 cm simple appearing left ovary cyst is identified. This is almost certainly benign, and no specific imaging follow up is recommended according to the Society of Radiologists in Ultrasound2010 Consensus Conference Statement (D Lenis Noon et al. Management of Asymptomatic Ovarian and Other Adnexal Cysts Imaged at [place]: Society of Radiologists in Ultrasound Consensus Conference Statement 2010. Radiology 256 (Sept 2010): 943-954.). 2. No evidence for ovarian torsion. Electronically Signed   By: Signa Kell M.D.   On: 06/11/2017 19:03    [place] Art/ven Flow Abd Pelv Doppler  Result Date: 06/10/2017 CLINICAL DATA:  Acute onset of mid to left-sided pelvic pain. Personal history of ovarian cysts. EXAM: TRANSABDOMINAL AND TRANSVAGINAL ULTRASOUND OF PELVIS DOPPLER ULTRASOUND OF OVARIES TECHNIQUE: Both transabdominal and transvaginal ultrasound examinations of the pelvis were performed. Transabdominal technique was performed for global imaging of the pelvis including uterus, ovaries, adnexal regions, and pelvic cul-de-sac. It was necessary to proceed with endovaginal exam following the transabdominal exam to visualize the endometrium and ovaries comet due to incomplete bladder distension. Color and duplex Doppler ultrasound was utilized to evaluate blood flow to the ovaries. COMPARISON:  CT abdomen and pelvis 03/27/2017, 05/04/2016 and earlier. Pelvic ultrasound 10/30/2016, 07/17/2016 and earlier. FINDINGS: Uterus Measurements: Approximately 7.4 x 3.5 x 3.9 cm. Homogeneous echotexture without focal fibroid or other myometrial abnormality. Normal-appearing uterine cervix. Endometrium Thickness: Approximately 6 mm. Normal appearance without evidence of endometrial fluid or mass. Right ovary Measurements: Approximately 3.6 x 1.5 x 1.2 cm. Small follicular cysts. No dominant cyst or solid mass. Normal color Doppler flow  within the ovary. Left ovary Measurements: Approximately 4.0 x 3.0 x 3.5 cm. Benign simple cyst measuring approximately 3.0 x 2.2 x 3.2 cm. No solid mass. Normal color Doppler flow within the ovary. Pulsed Doppler evaluation of both ovaries demonstrates normal low-resistance arterial and venous waveforms. Other findings Minimal free fluid in the cul de sac. IMPRESSION: 1. Approximate 3 cm benign simple cyst involving the left ovary. 2. Otherwise normal examination. Electronically Signed   By: Hulan Saas M.D.   On: 06/10/2017 13:42    Procedures Procedures (including critical care time)  Medications Ordered in ED Medications   ketorolac (TORADOL) 15 MG/ML injection 15 mg (15 mg Intravenous Given 06/11/17 1631)  ondansetron (ZOFRAN) injection 4 mg (4 mg Intravenous Given 06/11/17 1631)  sodium chloride 0.9 % bolus 500 mL (0 mLs Intravenous Stopped 06/11/17 1848)  morphine 4 MG/ML injection 4 mg (4 mg Intravenous Given 06/11/17 1801)  diphenhydrAMINE (BENADRYL) injection 25 mg (25 mg Intravenous Given 06/11/17 1801)     Initial Impression / Assessment and Plan / ED Course  I have reviewed the triage vital signs and the nursing notes.  Pertinent labs & imaging results that were available during my care of the patient were reviewed by me and considered in my medical decision making (see chart for details).    On initial assessment, Patient was typing on her phone that she was currently having a pseudoseizure with panic attack.   She was to reposition herself and with distractions, her symptoms resolved. Will coaching her to slow down her breathing, she was was able to respond to all questions appropriately.  Generalized discomfort with abdominal palpation causing her to feel nauseated. LLQ pain not aggravated by palpation. She states the pain radiates from the suprapubic region to LLQ up her flank to her back.  Will treat he pain and nausea and reassess.  Labs were repeated from triage, but her labs from yesterday were unremarkable. [place]  Without acute abnormalities. Likely benign left ovarian cyst. No torsion.  On reassessment, she reported improvement but was still in pain and wanted relief prior to [place].  Patient had a CT abdomen and pelvis with contrast  2 months ago which was completely normal and did not show any renal stones.  On reassessment, patient reported significant overall improvement and was well-appearing and ready to go home. Soft abdomen.  I suspect that patient's symptoms are due to current menstrual cycle with hx of endometriosis in addition to left ovarian cyst.  Urged patient to follow up with  GYN and provided resources. Patient discharged with zofran ODT.  Discussed strict return precautions and advised to return to the emergency department if experiencing any new or worsening symptoms. Instructions were understood and patient agreed with discharge plan.   Final Clinical Impressions(s) / ED Diagnoses   Final diagnoses:  Left lower quadrant pain  Cyst of left ovary    New Prescriptions New Prescriptions   ONDANSETRON (ZOFRAN ODT) 4 MG DISINTEGRATING TABLET    Take 1 tablet (4 mg total) by mouth every 8 (eight) hours as needed for nausea or vomiting.     Georgiana Shore, PA-C 06/11/17 2044    Alvira Monday, MD 06/13/17 1320

## 2017-06-11 NOTE — ED Notes (Addendum)
Pt brought back for blood draw and noted to have a pseudoseizure.  Pt able to answer questions and follow commands during episode.

## 2017-06-11 NOTE — Discharge Instructions (Signed)
As discussed, make sure to follow up with the women's clinic. Apply heat packs and take your pain medication as needed.  Your labs were normal today and your ultrasound without acute abnormalities. Likely benign cyst on the left ovary. Your pain can be the result of cyst, menstrual cramps and endometriosis which are best treated with Nsaids.   Follow up with your primary care provider as well. Return if new concerning symptoms in the meantime.

## 2017-07-10 ENCOUNTER — Inpatient Hospital Stay (HOSPITAL_COMMUNITY)
Admission: AD | Admit: 2017-07-10 | Discharge: 2017-07-10 | Disposition: A | Discharge status: Home / Self Care | Payer: BLUE CROSS/BLUE SHIELD | Source: Ambulatory Visit | Attending: Obstetrics & Gynecology | Admitting: Obstetrics & Gynecology

## 2017-07-10 DIAGNOSIS — R1032 Left lower quadrant pain: Secondary | ICD-10-CM | POA: Diagnosis not present

## 2017-07-10 DIAGNOSIS — Z3202 Encounter for pregnancy test, result negative: Secondary | ICD-10-CM | POA: Insufficient documentation

## 2017-07-10 DIAGNOSIS — R109 Unspecified abdominal pain: Secondary | ICD-10-CM

## 2017-07-10 DIAGNOSIS — F17290 Nicotine dependence, other tobacco product, uncomplicated: Secondary | ICD-10-CM | POA: Diagnosis not present

## 2017-07-10 LAB — WET PREP, GENITAL
Clue Cells Wet Prep HPF POC: NONE SEEN
Sperm: NONE SEEN
Trich, Wet Prep: NONE SEEN
Yeast Wet Prep HPF POC: NONE SEEN

## 2017-07-10 LAB — CBC
HCT: 38.3 % (ref 36.0–46.0)
Hemoglobin: 12.1 g/dL (ref 12.0–15.0)
MCH: 23.9 pg — AB (ref 26.0–34.0)
MCHC: 31.6 g/dL (ref 30.0–36.0)
MCV: 75.7 fL — ABNORMAL LOW (ref 78.0–100.0)
PLATELETS: 291 10*3/uL (ref 150–400)
RBC: 5.06 MIL/uL (ref 3.87–5.11)
RDW: 15.2 % (ref 11.5–15.5)
WBC: 7.6 10*3/uL (ref 4.0–10.5)

## 2017-07-10 LAB — URINALYSIS, ROUTINE W REFLEX MICROSCOPIC
Bacteria, UA: NONE SEEN
Bilirubin Urine: NEGATIVE
Glucose, UA: NEGATIVE mg/dL
Ketones, ur: NEGATIVE mg/dL
Nitrite: NEGATIVE
Protein, ur: 30 mg/dL — AB
Specific Gravity, Urine: 1.018 (ref 1.005–1.030)
pH: 7 (ref 5.0–8.0)

## 2017-07-10 LAB — POCT PREGNANCY, URINE: PREG TEST UR: NEGATIVE

## 2017-07-10 MED ORDER — ONDANSETRON 8 MG PO TBDP
8.0000 mg | ORAL_TABLET | Freq: Once | ORAL | Status: AC
  Administered 2017-07-10: 8 mg via ORAL
  Filled 2017-07-10: qty 1

## 2017-07-10 MED ORDER — KETOROLAC TROMETHAMINE 60 MG/2ML IM SOLN
60.0000 mg | Freq: Once | INTRAMUSCULAR | Status: AC
  Administered 2017-07-10: 60 mg via INTRAMUSCULAR
  Filled 2017-07-10: qty 2

## 2017-07-10 NOTE — MAU Provider Note (Signed)
History     CSN: [id number]  Arrival date and time: 07/10/17 1045   First Provider Initiated Contact with Patient 07/10/17 1218      Chief Complaint  Patient presents with  . Abdominal Pain  . Back Pain   HPI Ashlee Thompson is a 21 y.o. non pregnant female who presents with abdominal pain. This is a recurrent issue that she has been evaluated for multiple times. Current episode began 3 days ago. Reports LLQ pain that radiates to low back. Rates pain 8/10. Has not treated pain. Nothing makes pain better or worse. Describes as constant throbbing pain. Endorses nausea (no vomiting), vaginal bleeding, & diarrhea. PMH significant for ovarian cysts, endometriosis, & IBS. States her diarrhea is normal for her IBS. Denies fever/chills, dysuria, changes in appetite, vaginal discharge, dyspareunia, or postcoital bleeding. She is in monogamous relationship x 8 years. Has a PCP, GI specialist, & gynecologist; has not called her doctors about current symptoms prior to coming to MAU.   Past Medical History:  Diagnosis Date  . Anxiety   . Bilateral ovarian cysts   . Diabetes mellitus without complication (HCC)   . GI bleed due to NSAIDs   . Myocardial infarction (HCC)   . Pseudoseizures     Past Surgical History:  Procedure Laterality Date  . ESOPHAGOGASTRODUODENOSCOPY N/A 07/07/2016   Procedure: ESOPHAGOGASTRODUODENOSCOPY (EGD);  Surgeon: Ruffin FrederickSteven Paul Armbruster, MD;  [facility];  Service: Gastroenterology;  Laterality: N/A;    Family History  Problem Relation Age of Onset  . Hyperlipidemia Mother   . Mental illness Mother   . Hyperlipidemia Father   . Mental illness Brother   . Heart disease Paternal Grandfather   . Hyperlipidemia Paternal Grandfather   . Hypertension Paternal Grandfather   . CAD Other   . Cancer Other     Social History  Substance Use Topics  . Smoking status: Current Some Day Smoker    Packs/day: 0.25    Types: Cigars  . Smokeless tobacco: Never  Used  . Alcohol use Yes     Comment: social    Allergies:  Allergies  Allergen Reactions  . Bentyl [Dicyclomine Hcl] Cough    Coughs up blood  . Haldol [Haloperidol Lactate] Other (See Comments)    Psychotic episode  . Tramadol Hives and Itching  . Latex Itching, Swelling and Rash    Reaction to condoms    Prescriptions Prior to Admission  Medication Sig Dispense Refill Last Dose  . etonogestrel (NEXPLANON) 68 MG IMPL implant 1 each by Subdermal route continuous.   07/10/2017 at Unknown time  . FLUoxetine (PROZAC) 10 MG capsule Take 10 mg by mouth daily.   07/08/2017  . hydrocortisone (PROCTOSOL HC) 2.5 % rectal cream Place 1 application rectally 2 (two) times daily as needed for hemorrhoids or anal itching.   Past Week at Unknown time  . Naproxen Sodium 220 MG CAPS Take 440 mg by mouth daily as needed (for pain).   07/09/2017 at Unknown time  . omeprazole (PRILOSEC) 40 MG capsule Take 40 mg by mouth 2 (two) times daily as needed (for reflux).   1 07/08/2017  . ondansetron (ZOFRAN) 4 MG tablet Take 1 tablet (4 mg total) by mouth every 8 (eight) hours as needed for nausea or vomiting. 12 tablet 0 07/09/2017 at Unknown time  . linaclotide (LINZESS) 145 MCG CAPS capsule Take 1 capsule (145 mcg total) by mouth daily before breakfast. (Patient not taking: Reported on 04/13/2017) 30 capsule 0 Not Taking at  Unknown time  . naproxen (NAPROSYN) 500 MG tablet Take 1 tablet (500 mg total) by mouth 2 (two) times daily with a meal. (Patient not taking: Reported on 06/10/2017) 30 tablet 0 Not Taking at Unknown time  . oxyCODONE-acetaminophen (PERCOCET/ROXICET) 5-325 MG tablet Take 1 tablet by mouth every 4 (four) hours as needed for severe pain. (Patient not taking: Reported on 07/10/2017) 15 tablet 0 Not Taking at Unknown time    Review of Systems  Constitutional: Negative for appetite change, chills and fever.  Gastrointestinal: Positive for abdominal pain, diarrhea and nausea. Negative for  abdominal distention, blood in stool, constipation and vomiting.  Genitourinary: Positive for vaginal bleeding. Negative for dyspareunia, dysuria and vaginal discharge.  Musculoskeletal: Positive for back pain.   Physical Exam   Blood pressure 116/66, pulse 65, temperature 98.3 F (36.8 C), temperature source Oral, resp. rate 18, weight 264 lb 4 oz (119.9 kg), last menstrual period 06/15/2017, SpO2 100 %.  Physical Exam  Nursing note and vitals reviewed. Constitutional: She is oriented to person, place, and time. She appears well-developed and well-nourished. No distress.  HENT:  Head: Normocephalic and atraumatic.  Eyes: Conjunctivae are normal. Right eye exhibits no discharge. Left eye exhibits no discharge. No scleral icterus.  Neck: Normal range of motion.  Cardiovascular: Normal rate, regular rhythm and normal heart sounds.   No murmur heard. Respiratory: Effort normal and breath sounds normal. No respiratory distress. She has no wheezes.  GI: Soft. Bowel sounds are normal. She exhibits no distension. There is tenderness in the left lower quadrant. There is no rebound and no guarding.  Genitourinary: Uterus normal. Cervix exhibits no motion tenderness. Right adnexum displays no mass and no tenderness. Left adnexum displays tenderness. Left adnexum displays no mass.  Genitourinary Comments: Small amount of dark red blood appropriate for menses  Neurological: She is alert and oriented to person, place, and time.  Skin: Skin is warm and dry. She is not diaphoretic.  Psychiatric: She has a normal mood and affect. Her behavior is normal. Judgment and thought content normal.    MAU Course  Procedures Results for orders placed or performed during the hospital encounter of 07/10/17 (from the past 48 hour(s))  Urinalysis, Routine w reflex microscopic     Status: Abnormal   Collection Time: 07/10/17 11:00 AM  Result Value Ref Range   Color, Urine YELLOW YELLOW   APPearance HAZY (A) CLEAR    Specific Gravity, Urine 1.018 1.005 - 1.030   pH 7.0 5.0 - 8.0   Glucose, UA NEGATIVE NEGATIVE mg/dL   Hgb urine dipstick LARGE (A) NEGATIVE   Bilirubin Urine NEGATIVE NEGATIVE   Ketones, ur NEGATIVE NEGATIVE mg/dL   Protein, ur 30 (A) NEGATIVE mg/dL   Nitrite NEGATIVE NEGATIVE   Leukocytes, UA TRACE (A) NEGATIVE   RBC / HPF 6-30 0 - 5 RBC/hpf   WBC, UA 6-30 0 - 5 WBC/hpf   Bacteria, UA NONE SEEN NONE SEEN   Squamous Epithelial / LPF 0-5 (A) NONE SEEN   Mucus PRESENT   Pregnancy, urine POC     Status: None   Collection Time: 07/10/17 11:28 AM  Result Value Ref Range   Preg Test, Ur NEGATIVE NEGATIVE    Comment:        THE SENSITIVITY OF THIS METHODOLOGY IS >24 mIU/mL   Wet prep, genital     Status: Abnormal   Collection Time: 07/10/17 12:36 PM  Result Value Ref Range   Yeast Wet Prep HPF POC NONE SEEN  NONE SEEN   Trich, Wet Prep NONE SEEN NONE SEEN   Clue Cells Wet Prep HPF POC NONE SEEN NONE SEEN   WBC, Wet Prep HPF POC FEW (A) NONE SEEN    Comment: MODERATE BACTERIA SEEN   Sperm NONE SEEN   CBC     Status: Abnormal   Collection Time: 07/10/17 12:51 PM  Result Value Ref Range   WBC 7.6 4.0 - 10.5 K/uL   RBC 5.06 3.87 - 5.11 MIL/uL   Hemoglobin 12.1 12.0 - 15.0 g/dL   HCT 96.0 45.4 - 09.8 %   MCV 75.7 (L) 78.0 - 100.0 fL   MCH 23.9 (L) 26.0 - 34.0 pg   MCHC 31.6 30.0 - 36.0 g/dL   RDW 11.9 14.7 - 82.9 %   Platelets 291 150 - 400 K/uL    MDM UPT negative. VSS. CBC -- no leukocytosis.  Toradol given for pain -- reports improvement in symptoms.  Mild TTP in LLQ, no rebound or rigidity. No adnexal masses appreciated on bimanual exam. No CMT.GC/CT & wet prep collected.  C/w Dr. Charlotta Newton. Ok to discharge home. Patient to f/u with her GI specialist.   Assessment and Plan  A; 1. Abdominal pain in female patient   2. Pregnancy examination or test, negative result    P; Discharge home Discussed tx of symptoms using OTC meds Discussed f/u with PCP vs GI vs gyn If  symptoms worsen or fever develops, pt directed to go to ED GC/CT pending  Judeth Horn 07/10/2017, 12:18 PM

## 2017-07-10 NOTE — Discharge Instructions (Signed)
[facility] [employer] [facility] [employer] at [facility]       Phone: [phone]  Center for [employer] at [employer] Phone: [phone]  Center for [employer] at [place]  Phone: [phone]  Center for [employer] at [employer]  Phone: [phone]  Center for [facility] at [place]  Phone: [phone]  [facility]       Phone: [phone]  [facility]    Phone: [phone]   [facility] [place])    Phone: [phone]  [facility]    Phone: [phone]  [facility]                                     Phone: [phone]  [facility]    Phone: [phone]  [facility]    Phone: [phone]  [facility]       Phone: [phone]   [facility]  Phone: [phone]  [facility]    Phone: [phone]  [facility] [place]   Phone: [phone]  [facility]      Phone: [phone]  [facility]    Phone: [phone]      In late 2019, the [facility] will be moving to the [facility] campus. At that time, the MAU will no longer serve non-pregnant patients. We encourage you to establish care with a provider before that time, so that you can be seen with any GYN concerns, like vaginal discharge, urinary tract infection, etc.. in a timely manner. In order to make the office visit more convenient, the Center for [facility] at [facility] will be offering evening hours from 4pm-7:30pm on Mondays starting 05/27/17. There will be same-day appointments, walk-in appointments and scheduled appointments available during this time.    Center for [facility] @ [facility] [phone]  For urgent needs, [facility] is also available for management of urgent GYN complaints such as vaginal discharge.   [employer] extends eligibility for family planning services to reduce unintended pregnancies and improve the well-being of children and families.   Eligible individuals whose income is at or below 195% of the federal poverty level and who are:  - [place] citizens, documented immigrants or qualified aliens;  - Residents of [place];  - Not incarcerated; and  - Not pregnant.   [employer] Medicaid Family Planning Contact Information:  Medical Assistance Clinical Section Phone: [phone] Email: [email or web address]        Abdominal Pain, Adult Abdominal pain can be caused by many things. Often, abdominal pain is not serious and it gets better with no treatment or by being treated at home. However, sometimes abdominal pain is serious. Your health care provider will do a medical history and a physical exam to try to determine the cause of your abdominal pain. Follow these instructions at home:  Take over-the-counter and prescription medicines only as told by your health care provider. Do not take a laxative unless told by your health care provider.  Drink enough fluid to keep your urine clear or pale yellow.  Watch your condition for any changes.  Keep all follow-up visits as told by your health care provider. This is important. Contact a health care provider if:  Your abdominal pain changes or gets  worse.  You are not hungry or you lose weight without trying.  You are constipated or have diarrhea for more than 2-3 days.  You have pain when you urinate or have a bowel movement.  Your abdominal pain wakes you up at night.  Your pain gets worse with meals, after eating, or with certain foods.  You are throwing up and cannot keep anything down.  You have a fever. Get help right away if:  Your pain does not go away as soon as your health care provider  told you to expect.  You cannot stop throwing up.  Your pain is only in areas of the abdomen, such as the right side or the left lower portion of the abdomen.  You have bloody or black stools, or stools that look like tar.  You have severe pain, cramping, or bloating in your abdomen.  You have signs of dehydration, such as: ? Dark urine, very little urine, or no urine. ? Cracked lips. ? Dry mouth. ? Sunken eyes. ? Sleepiness. ? Weakness. This information is not intended to replace advice given to you by your health care provider. Make sure you discuss any questions you have with your health care provider. Document Released: 06/13/2005 Document Revised: 03/23/2016 Document Reviewed: 02/15/2016 Elsevier Interactive Patient Education  2017 [employer]

## 2017-07-10 NOTE — Progress Notes (Addendum)
Presents to triage for LLQ and back pain that started on Monday. Bleeding started last night and not her cycle since she had it end of Sept. Sept 29-0ct 5.  Hx of ovarian cysts since [age] years old. Scan done and found cysts on right ovaries. Pt does not remember what procedure was done. Due to ovarian cysts pt states contracted numerous yeast infection at a young age.   No hx of pregnancy. Pt dx 2 years ago with endometriosis. Pt states scheduled for surgery but has been put off bc pt states "physical therapy" was recommended.   1252: meds given per order 1255: wetprep and GC done. Peri Pad given for bleeding. Lab at bs.   1338: Provider at bs for pelvic exam. Pt states pain med and zofran helped.   1419: Discharge instructions given with pt understanding. Pt left unit via ambulatory.

## 2017-07-10 NOTE — MAU Note (Signed)
Hx of ovarian cysts.  Pain started on Monday, LLQ and low back pain.  Feels like what she has had before.  Started  Bleeding last night, not on cycle

## 2017-07-11 LAB — GC/CHLAMYDIA PROBE AMP (~~LOC~~) NOT AT ARMC
CHLAMYDIA, DNA PROBE: NEGATIVE
Neisseria Gonorrhea: NEGATIVE

## 2017-08-03 ENCOUNTER — Other Ambulatory Visit: Payer: Self-pay

## 2017-08-03 ENCOUNTER — Emergency Department (HOSPITAL_COMMUNITY): Payer: BLUE CROSS/BLUE SHIELD

## 2017-08-03 ENCOUNTER — Encounter (HOSPITAL_COMMUNITY): Payer: Self-pay | Admitting: Emergency Medicine

## 2017-08-03 ENCOUNTER — Emergency Department (HOSPITAL_COMMUNITY)
Admission: EM | Admit: 2017-08-03 | Discharge: 2017-08-03 | Disposition: A | Discharge status: Home / Self Care | Payer: BLUE CROSS/BLUE SHIELD | Attending: Emergency Medicine | Admitting: Emergency Medicine

## 2017-08-03 DIAGNOSIS — R079 Chest pain, unspecified: Secondary | ICD-10-CM

## 2017-08-03 DIAGNOSIS — R0789 Other chest pain: Secondary | ICD-10-CM | POA: Diagnosis not present

## 2017-08-03 DIAGNOSIS — R569 Unspecified convulsions: Secondary | ICD-10-CM | POA: Insufficient documentation

## 2017-08-03 DIAGNOSIS — N938 Other specified abnormal uterine and vaginal bleeding: Secondary | ICD-10-CM | POA: Diagnosis not present

## 2017-08-03 DIAGNOSIS — R51 Headache: Secondary | ICD-10-CM | POA: Diagnosis not present

## 2017-08-03 DIAGNOSIS — I252 Old myocardial infarction: Secondary | ICD-10-CM | POA: Insufficient documentation

## 2017-08-03 DIAGNOSIS — N83201 Unspecified ovarian cyst, right side: Secondary | ICD-10-CM | POA: Diagnosis not present

## 2017-08-03 DIAGNOSIS — Z91040 Latex allergy status: Secondary | ICD-10-CM | POA: Diagnosis not present

## 2017-08-03 DIAGNOSIS — N83202 Unspecified ovarian cyst, left side: Secondary | ICD-10-CM | POA: Insufficient documentation

## 2017-08-03 DIAGNOSIS — R1030 Lower abdominal pain, unspecified: Secondary | ICD-10-CM | POA: Insufficient documentation

## 2017-08-03 DIAGNOSIS — R112 Nausea with vomiting, unspecified: Secondary | ICD-10-CM | POA: Insufficient documentation

## 2017-08-03 DIAGNOSIS — E119 Type 2 diabetes mellitus without complications: Secondary | ICD-10-CM | POA: Insufficient documentation

## 2017-08-03 DIAGNOSIS — Z885 Allergy status to narcotic agent status: Secondary | ICD-10-CM | POA: Diagnosis not present

## 2017-08-03 DIAGNOSIS — F419 Anxiety disorder, unspecified: Secondary | ICD-10-CM | POA: Insufficient documentation

## 2017-08-03 DIAGNOSIS — F17290 Nicotine dependence, other tobacco product, uncomplicated: Secondary | ICD-10-CM | POA: Insufficient documentation

## 2017-08-03 LAB — CBC
HCT: 39.1 % (ref 36.0–46.0)
Hemoglobin: 12.3 g/dL (ref 12.0–15.0)
MCH: 23.9 pg — ABNORMAL LOW (ref 26.0–34.0)
MCHC: 31.5 g/dL (ref 30.0–36.0)
MCV: 76.1 fL — ABNORMAL LOW (ref 78.0–100.0)
Platelets: 310 10*3/uL (ref 150–400)
RBC: 5.14 MIL/uL — ABNORMAL HIGH (ref 3.87–5.11)
RDW: 15.2 % (ref 11.5–15.5)
WBC: 7.9 10*3/uL (ref 4.0–10.5)

## 2017-08-03 LAB — BASIC METABOLIC PANEL
ANION GAP: 5 (ref 5–15)
BUN: 7 mg/dL (ref 6–20)
CALCIUM: 9.1 mg/dL (ref 8.9–10.3)
CO2: 25 mmol/L (ref 22–32)
CREATININE: 0.58 mg/dL (ref 0.44–1.00)
Chloride: 106 mmol/L (ref 101–111)
GFR calc Af Amer: >60 mL/min (ref >60)
GLUCOSE: 97 mg/dL (ref 65–99)
Potassium: 4 mmol/L (ref 3.5–5.1)
Sodium: 136 mmol/L (ref 135–145)

## 2017-08-03 LAB — HEPATIC FUNCTION PANEL
ALT: 16 U/L (ref 14–54)
AST: 18 U/L (ref 15–41)
Albumin: 3.6 g/dL (ref 3.5–5.0)
Alkaline Phosphatase: 91 U/L (ref 38–126)
Total Bilirubin: 0.6 mg/dL (ref 0.3–1.2)
Total Protein: 6.6 g/dL (ref 6.5–8.1)

## 2017-08-03 LAB — I-STAT TROPONIN, ED: TROPONIN I, POC: 0 ng/mL (ref 0.00–0.08)

## 2017-08-03 MED ORDER — HYDROCODONE-ACETAMINOPHEN 5-325 MG PO TABS: 1.0000 | ORAL_TABLET | Freq: Three times a day (TID) | ORAL | 0 refills | Status: DC | PRN

## 2017-08-03 MED ORDER — SODIUM CHLORIDE 0.9 % IV BOLUS (SEPSIS)
1000.0000 mL | Freq: Once | INTRAVENOUS | Status: AC
  Administered 2017-08-03: 1000 mL via INTRAVENOUS

## 2017-08-03 MED ORDER — HYDROCODONE-ACETAMINOPHEN 5-325 MG PO TABS
1.0000 | ORAL_TABLET | Freq: Once | ORAL | Status: AC
  Administered 2017-08-03: 1 via ORAL
  Filled 2017-08-03: qty 1

## 2017-08-03 MED ORDER — LORAZEPAM 0.5 MG PO TABS: 0.5000 mg | ORAL_TABLET | Freq: Three times a day (TID) | ORAL | 0 refills | Status: DC | PRN

## 2017-08-03 MED ORDER — MORPHINE SULFATE (PF) 4 MG/ML IV SOLN
4.0000 mg | Freq: Once | INTRAVENOUS | Status: AC
  Administered 2017-08-03: 4 mg via INTRAVENOUS
  Filled 2017-08-03: qty 1

## 2017-08-03 MED ORDER — KETOROLAC TROMETHAMINE 30 MG/ML IJ SOLN
15.0000 mg | Freq: Once | INTRAMUSCULAR | Status: AC
  Administered 2017-08-03: 15 mg via INTRAVENOUS
  Filled 2017-08-03: qty 1

## 2017-08-03 MED ORDER — LORAZEPAM 2 MG/ML IJ SOLN
1.0000 mg | Freq: Once | INTRAMUSCULAR | Status: AC
  Administered 2017-08-03: 1 mg via INTRAVENOUS
  Filled 2017-08-03: qty 1

## 2017-08-03 MED ORDER — IBUPROFEN 400 MG PO TABS: 400.0000 mg | ORAL_TABLET | Freq: Three times a day (TID) | ORAL | 0 refills | Status: AC

## 2017-08-03 NOTE — ED Notes (Signed)
Patient Alert and oriented X4. Stable and ambulatory. Patient verbalized understanding of the discharge instructions.  Patient belongings were taken by the patient.  

## 2017-08-03 NOTE — ED Triage Notes (Signed)
Pt states, "I have endometrial pain, but now it's hurting in my chest,too."  Pt reports lower abd pain x 2 days, N/v since yesterday. Pt states, "I have a cardiac hx. I coded when I was 18." Pt moaning, clutching chest.

## 2017-08-03 NOTE — ED Provider Notes (Signed)
[facility] EMERGENCY DEPARTMENT Provider Note   CSN: [id number] Arrival date & time: 08/03/17  1124     History   Chief Complaint Chief Complaint  Patient presents with  . Chest Pain  . Abdominal Pain    HPI Ashlee Thompson is a 21 y.o. female.  HPI Patient presents with concern of abdominal pain, chest pain, headache.  Patient is actually presenting from a radiology, where she was taken from our waiting room to have x-ray performed. While there she was witnessed to have seizure-like activity, and sent into the emergency department for evaluation. Radiology staff note that after she was observed to have this activity, she seemingly improved, was able to answer questions appropriately, soon thereafter. On my initial exam the patient is shaking, but answers questions briefly, appropriately. She states that she is began to have abdominal pain throughout the lower abdomen about 4 days ago, and over the interval has developed pain superiorly from her chest to her head. There is associated nausea, vomiting, but no diarrhea, no dysuria, hematuria. There is vaginal bleeding, she describes as being similar to menstrual cycle. She acknowledges history of endometriosis, anxiety, states that she has a history of seizures. She denies taking any antiseizure medication.  Past Medical History:  Diagnosis Date  . Anxiety   . Bilateral ovarian cysts   . Diabetes mellitus without complication (HCC)   . GI bleed due to NSAIDs   . Myocardial infarction (HCC)   . Pseudoseizures     Patient Active Problem List   Diagnosis Date Noted  . Headache   . UGI bleed 07/07/2016  . History of diabetes mellitus 07/07/2016  . Morbid obesity (HCC) 07/07/2016  . Hematemesis with nausea 07/07/2016  . GI bleed 07/07/2016    Past Surgical History:  Procedure Laterality Date  . ESOPHAGOGASTRODUODENOSCOPY (EGD) N/A 07/07/2016   Performed by Ruffin Frederick, MD at [facility] ENDOSCOPY     OB History    Gravida Para Term Preterm AB Living   0 0 0 0 0 0   SAB TAB Ectopic Multiple Live Births   0 0 0 0 0       Home Medications    Prior to Admission medications   Medication Sig Start Date End Date Taking? Authorizing Provider  diphenhydrAMINE (BENADRYL) 12.5 MG/5ML liquid Take 12.5 mg 4 (four) times daily as needed by mouth for allergies.   Yes [provider]  etonogestrel (NEXPLANON) 68 MG IMPL implant 1 each by Subdermal route continuous.   Yes [provider]  FLUoxetine (PROZAC) 10 MG capsule Take 10 mg by mouth daily.   Yes [provider]  hydrocortisone (PROCTOSOL HC) 2.5 % rectal cream Place 1 application rectally 2 (two) times daily as needed for hemorrhoids or anal itching.   Yes [provider]  ibuprofen (ADVIL,MOTRIN) 200 MG tablet Take 600-800 mg every 6 (six) hours as needed by mouth for moderate pain.   Yes [provider]  Naproxen Sodium 220 MG CAPS Take 440 mg by mouth daily as needed (for pain).   Yes [provider]  omeprazole (PRILOSEC) 40 MG capsule Take 40 mg by mouth 2 (two) times daily as needed (for reflux).  06/08/17  Yes [provider]  ondansetron (ZOFRAN) 4 MG tablet Take 1 tablet (4 mg total) by mouth every 8 (eight) hours as needed for nausea or vomiting. 06/10/17   Tegeler, Canary Brim, MD    Family History Family History  Problem Relation Age of  Onset  . Hyperlipidemia Mother   . Mental illness Mother   . Hyperlipidemia Father   . Mental illness Brother   . Heart disease Paternal Grandfather   . Hyperlipidemia Paternal Grandfather   . Hypertension Paternal Grandfather   . CAD Other   . Cancer Other     Social History Social History   Tobacco Use  . Smoking status: Current Some Day Smoker    Packs/day: 0.25    Types: Cigars  . Smokeless tobacco: Never Used  Substance Use Topics  . Alcohol use: Yes    Comment: social  . Drug use: No     Allergies     Bentyl [dicyclomine hcl]; Haldol [haloperidol lactate]; Tramadol; and Latex   Review of Systems Review of Systems  Constitutional:       Per HPI, otherwise negative  HENT:       Per HPI, otherwise negative  Respiratory:       Per HPI, otherwise negative  Cardiovascular:       Per HPI, otherwise negative  Gastrointestinal: Positive for abdominal pain, nausea and vomiting. Negative for diarrhea.  Endocrine:       Negative aside from HPI  Genitourinary:       Neg aside from HPI   Musculoskeletal:       Per HPI, otherwise negative  Skin: Negative.   Neurological: Negative for syncope.  Psychiatric/Behavioral: The patient is nervous/anxious.      Physical Exam Updated Vital Signs BP 107/61   Pulse 91   Temp (!) 97.5 F (36.4 C) (Oral)   Resp (!) 24   Ht 5\' 5"  (1.651 m)   Wt 117.9 kg (260 lb)   SpO2 97%   BMI 43.27 kg/m   Physical Exam  Constitutional: She is oriented to person, place, and time. She appears well-developed and well-nourished.  On well-appearing obese young female in left lateral decubitus position, answering questions appropriately, briefly, shaking  HENT:  Head: Normocephalic and atraumatic.  Eyes: Conjunctivae and EOM are normal.  Cardiovascular: Normal rate and regular rhythm.  Pulmonary/Chest: Effort normal and breath sounds normal. No stridor. No respiratory distress.  Abdominal: She exhibits no distension.  Musculoskeletal: She exhibits no edema.  Neurological: She is alert and oriented to person, place, and time. No cranial nerve deficit.  Shaking, but moving all extremities spontaneously, speaking appropriately, briefly.  No facial asymmetry no obvious seizure activity  Skin: Skin is warm and dry.  Psychiatric: Her mood appears anxious.  Nursing note and vitals reviewed.    ED Treatments / Results  Labs (all labs ordered are listed, but only abnormal results are displayed) Labs Reviewed  CBC - Abnormal; Notable for the following  components:      Result Value   RBC 5.14 (*)    MCV 76.1 (*)    MCH 23.9 (*)    All other components within normal limits  HEPATIC FUNCTION PANEL - Abnormal; Notable for the following components:   Bilirubin, Direct <0.1 (*)    All other components within normal limits  BASIC METABOLIC PANEL  I-STAT TROPONIN, ED    EKG  EKG Interpretation  Date/Time:  Saturday August 03 2017 11:29:51 EST Ventricular Rate:  107 PR Interval:  128 QRS Duration: 80 QT Interval:  340 QTC Calculation: 453 R Axis:   35 Text Interpretation:  Sinus tachycardia Artifact Otherwise within normal limits Confirmed by Gerhard MunchLockwood, Lonzell Dorris [phone]([id number]) on 08/03/2017 12:29:24 PM       Radiology Koreas Transvaginal Non-ob  Result  Date: 08/03/2017 CLINICAL DATA:  Pelvic pain EXAM: TRANSABDOMINAL AND TRANSVAGINAL ULTRASOUND OF PELVIS DOPPLER ULTRASOUND OF OVARIES TECHNIQUE: Both transabdominal and transvaginal ultrasound examinations of the pelvis were performed. Transabdominal technique was performed for global imaging of the pelvis including uterus, ovaries, adnexal regions, and pelvic cul-de-sac. It was necessary to proceed with endovaginal exam following the transabdominal exam to visualize the ovaries. Color and duplex Doppler ultrasound was utilized to evaluate blood flow to the ovaries. COMPARISON:  06/11/2017 FINDINGS: Uterus Measurements: 8.1 x 2.3 x 4.8 cm. No fibroids or other mass visualized. Endometrium Thickness: 3 mm.  No focal abnormality visualized. Right ovary Measurements: 2.3 x 1.4 x 2.6 cm. A 1.4 cm cyst is noted in the right ovary new from the prior exam. Left ovary Measurements: 4.3 x 2.4 x 2.7 cm. A cyst is noted in the left ovary 3.4 x 1.9 x 2.2 cm. The appearance suggests an involuting cyst when compared with the prior exam. Pulsed Doppler evaluation of both ovaries demonstrates normal low-resistance arterial and venous waveforms. Other findings Minimal free pelvic fluid is noted likely physiologic in  nature. IMPRESSION: No evidence of ovarian torsion Bilateral ovarian cysts are noted. Again these are almost certainly benign. The cyst on the left appears to be involuting when compared with the most recent exam. Electronically Signed   By: Alcide Clever M.D.   On: 08/03/2017 15:18   US Pelvis Complete  Result Date: 08/03/2017 CLINICAL DATA:  Pelvic pain EXAM: TRANSABDOMINAL AND TRANSVAGINAL ULTRASOUND OF PELVIS DOPPLER ULTRASOUND OF OVARIES TECHNIQUE: Both transabdominal and transvaginal ultrasound examinations of the pelvis were performed. Transabdominal technique was performed for global imaging of the pelvis including uterus, ovaries, adnexal regions, and pelvic cul-de-sac. It was necessary to proceed with endovaginal exam following the transabdominal exam to visualize the ovaries. Color and duplex Doppler ultrasound was utilized to evaluate blood flow to the ovaries. COMPARISON:  06/11/2017 FINDINGS: Uterus Measurements: 8.1 x 2.3 x 4.8 cm. No fibroids or other mass visualized. Endometrium Thickness: 3 mm.  No focal abnormality visualized. Right ovary Measurements: 2.3 x 1.4 x 2.6 cm. A 1.4 cm cyst is noted in the right ovary new from the prior exam. Left ovary Measurements: 4.3 x 2.4 x 2.7 cm. A cyst is noted in the left ovary 3.4 x 1.9 x 2.2 cm. The appearance suggests an involuting cyst when compared with the prior exam. Pulsed Doppler evaluation of both ovaries demonstrates normal low-resistance arterial and venous waveforms. Other findings Minimal free pelvic fluid is noted likely physiologic in nature. IMPRESSION: No evidence of ovarian torsion Bilateral ovarian cysts are noted. Again these are almost certainly benign. The cyst on the left appears to be involuting when compared with the most recent exam. Electronically Signed   By: Alcide Clever M.D.   On: 08/03/2017 15:18   [place] Art/ven Flow Abd Pelv Doppler  Result Date: 08/03/2017 CLINICAL DATA:  Pelvic pain EXAM: TRANSABDOMINAL AND TRANSVAGINAL  ULTRASOUND OF PELVIS DOPPLER ULTRASOUND OF OVARIES TECHNIQUE: Both transabdominal and transvaginal ultrasound examinations of the pelvis were performed. Transabdominal technique was performed for global imaging of the pelvis including uterus, ovaries, adnexal regions, and pelvic cul-de-sac. It was necessary to proceed with endovaginal exam following the transabdominal exam to visualize the ovaries. Color and duplex Doppler ultrasound was utilized to evaluate blood flow to the ovaries. COMPARISON:  06/11/2017 FINDINGS: Uterus Measurements: 8.1 x 2.3 x 4.8 cm. No fibroids or other mass visualized. Endometrium Thickness: 3 mm.  No focal abnormality visualized. Right ovary Measurements: 2.3  x 1.4 x 2.6 cm. A 1.4 cm cyst is noted in the right ovary new from the prior exam. Left ovary Measurements: 4.3 x 2.4 x 2.7 cm. A cyst is noted in the left ovary 3.4 x 1.9 x 2.2 cm. The appearance suggests an involuting cyst when compared with the prior exam. Pulsed Doppler evaluation of both ovaries demonstrates normal low-resistance arterial and venous waveforms. Other findings Minimal free pelvic fluid is noted likely physiologic in nature. IMPRESSION: No evidence of ovarian torsion Bilateral ovarian cysts are noted. Again these are almost certainly benign. The cyst on the left appears to be involuting when compared with the most recent exam. Electronically Signed   By: Alcide CleverMark  Lukens M.D.   On: 08/03/2017 15:18   Dg Chest Port 1 View  Result Date: 08/03/2017 CLINICAL DATA:  Chest pain for 2 days.  History of MI. EXAM: PORTABLE CHEST 1 VIEW COMPARISON:  July 07, 2016 FINDINGS: The heart size and mediastinal contours are within normal limits. Both lungs are clear. The visualized skeletal structures are unremarkable. IMPRESSION: No active disease. Electronically Signed   By: Gerome Samavid  Williams III M.D   On: 08/03/2017 12:28    Procedures Procedures (including critical care time)  Medications Ordered in ED Medications    HYDROcodone-acetaminophen (NORCO/VICODIN) 5-325 MG per tablet 1 tablet (not administered)  sodium chloride 0.9 % bolus 1,000 mL (0 mLs Intravenous Stopped 08/03/17 1332)  LORazepam (ATIVAN) injection 1 mg (1 mg Intravenous Given 08/03/17 1243)  ketorolac (TORADOL) 30 MG/ML injection 15 mg (15 mg Intravenous Given 08/03/17 1242)  morphine 4 MG/ML injection 4 mg (4 mg Intravenous Given 08/03/17 1351)     Initial Impression / Assessment and Plan / ED Course  I have reviewed the triage vital signs and the nursing notes.  Pertinent labs & imaging results that were available during my care of the patient were reviewed by me and considered in my medical decision making (see chart for details).  Chart review notable for history of pseudoseizures, anxiety.  4:04 PM Patient in no distress, awake, alert. She has had no more seizure-like activity.  This is consistent with the patient's history of pseudoseizures, given the description of return to appropriate interactivity soon after her shaking episode.  Ultrasound reviewed with the patient, no evidence for torsion, but there are bilateral ovarian cysts, including one that is likely involuting on the left. Patient's abdominal exam remains non-peritoneal, she is hemodynamically stable. She and I had a lengthy conversation about all findings including ultrasound results, labs, importance of following up with gynecology for continued management. Patient discharged in stable condition.   Final Clinical Impressions(s) / ED Diagnoses   Final diagnoses:  Chest pain  Ovarian cyst, bilateral Seizure-like activity   Gerhard MunchLockwood, Kacelyn Rowzee, MD 08/03/17 1605

## 2017-08-25 ENCOUNTER — Telehealth: Payer: Self-pay | Admitting: *Deleted

## 2017-08-25 NOTE — Telephone Encounter (Signed)
Called and LVM informing patient that office will be closed on Monday 12/10. Will call back ASAP, hopefully this week to r/s appointment.

## 2017-08-25 NOTE — Telephone Encounter (Signed)
Called and spoke with patient informing her that the office will be closed tomorrow, Monday 12/10 and we will call to reschedule her appt ASAP. She verbalized understanding and appreciation for the call.

## 2017-08-26 ENCOUNTER — Ambulatory Visit: Payer: BLUE CROSS/BLUE SHIELD | Admitting: Neurology

## 2017-08-28 NOTE — Telephone Encounter (Signed)
Called patient and rescheduled to Fri 08/30/17 @ 10:30 with an arrival time of 10:00. She verbalized understanding and appreciation for the call.

## 2017-08-29 NOTE — Progress Notes (Deleted)
[facility]    Provider:  Dr Lucia GaskinsAhern Referring Provider[facility]  [facility]  CC:    HPI:  Ashlee Thompson is a 21 y.o. female here as a referral from Dr. Eli Phillipsenter for headaches with possible seizures.  Worsening headaches with questionable new onset of seizure and family history of migraine. Hx of Pseudoseizures, anxiety, DM, GI bleed due to NSAIDs, MI.  Reviewed notes, labs and imaging from outside physicians, which showed ***  Referral notes: Patient reported she had her hair done, following that she noticed a bad headache, she felt it was due to the weight of the new here so she removed it, the headache worsened after that.  Due to the headache having associated nausea vomiting and photophobia.  She states that this is also making her seizures worse.  Patient last visit she was [facility] due to questionable seizure activity, she just zones out and does not remember things during the episodes and has to be reminded by friends, is a seizure history and her aunt other family history includes mainly hypertension and diabetes.  Patient has a considerable history of emergency room visits including visits in November 2018, October 2018, September 2018, July 2018, June 2018, February 2018 and previous encounters in prior years for multiple complaints including atypical chest pain, abdominal pain, constipation, contusion chronic pelvic pain, seizure and other conditions.  Reviewed emergency notes in November, patient presented from radiology where she had seizure-like activity, after the activity she is seemingly improved and was able to answer questions appropriately soon afterwards, in the emergency room patient is shaking but answers questions appropriately and complained of abdominal and lower abdominal pain and has since developed pain superiorly from her head to her chest, associated nausea, vomiting, history  of endometriosis, anxiety and she stated she had us history of seizures.  Review of Systems: Patient complains of symptoms per HPI as well as the following symptoms ***. Pertinent negatives and positives per HPI. All others negative.   Social History   Socioeconomic History  . Marital status: Single    Spouse name: Not on file  . Number of children: Not on file  . Years of education: Not on file  . Highest education level: Not on file  Social Needs  . Financial resource strain: Not on file  . Food insecurity - worry: Not on file  . Food insecurity - inability: Not on file  . Transportation needs - medical: Not on file  . Transportation needs - non-medical: Not on file  Occupational History  . Not on file  Tobacco Use  . Smoking status: Current Some Day Smoker    Packs/day: 0.25    Types: Cigars  . Smokeless tobacco: Never Used  Substance and Sexual Activity  . Alcohol use: Yes    Comment: social  . Drug use: No  . Sexual activity: Yes    Birth control/protection: Implant  Other Topics Concern  . Not on file  Social History Narrative  . Not on file    Family History  Problem Relation Age of Onset  . Hyperlipidemia Mother   . Mental illness Mother   . Hyperlipidemia Father   . Mental illness Brother   . Heart disease Paternal Grandfather   . Hyperlipidemia Paternal Grandfather   . Hypertension Paternal Grandfather   . CAD Other   . Cancer Other     Past Medical History:  Diagnosis Date  . Anxiety   .  Bilateral ovarian cysts   . Diabetes mellitus without complication (HCC)   . GI bleed due to NSAIDs   . Myocardial infarction (HCC)   . Pseudoseizures     Past Surgical History:  Procedure Laterality Date  . ESOPHAGOGASTRODUODENOSCOPY N/A 07/07/2016   Procedure: ESOPHAGOGASTRODUODENOSCOPY (EGD);  Surgeon: Ruffin FrederickSteven Paul Armbruster, MD;  [facility];  Service: Gastroenterology;  Laterality: N/A;    Current Outpatient Medications  Medication Sig  Dispense Refill  . diphenhydrAMINE (BENADRYL) 12.5 MG/5ML liquid Take 12.5 mg 4 (four) times daily as needed by mouth for allergies.    Marland Kitchen. etonogestrel (NEXPLANON) 68 MG IMPL implant 1 each by Subdermal route continuous.    Marland Kitchen. FLUoxetine (PROZAC) 10 MG capsule Take 10 mg by mouth daily.    Marland Kitchen. HYDROcodone-acetaminophen (NORCO/VICODIN) 5-325 MG tablet Take 1 tablet 3 (three) times daily as needed by mouth for severe pain. 15 tablet 0  . hydrocortisone (PROCTOSOL HC) 2.5 % rectal cream Place 1 application rectally 2 (two) times daily as needed for hemorrhoids or anal itching.    Marland Kitchen. LORazepam (ATIVAN) 0.5 MG tablet Take 1 tablet (0.5 mg total) every 8 (eight) hours as needed by mouth for anxiety. 15 tablet 0  . Naproxen Sodium 220 MG CAPS Take 440 mg by mouth daily as needed (for pain).    Marland Kitchen. omeprazole (PRILOSEC) 40 MG capsule Take 40 mg by mouth 2 (two) times daily as needed (for reflux).   1  . ondansetron (ZOFRAN) 4 MG tablet Take 1 tablet (4 mg total) by mouth every 8 (eight) hours as needed for nausea or vomiting. 12 tablet 0   No current facility-administered medications for this visit.     Allergies as of 08/30/2017 - Review Complete 08/03/2017  Allergen Reaction Noted  . Bentyl [dicyclomine hcl] Cough 02/17/2017  . Haldol [haloperidol lactate] Other (See Comments) 04/13/2017  . Tramadol Hives and Itching 01/22/2016  . Latex Itching, Swelling, and Rash 05/05/2016    Vitals: There were no vitals taken for this visit. Last Weight:  Wt Readings from Last 1 Encounters:  08/03/17 260 lb (117.9 kg)   Last Height:   Ht Readings from Last 1 Encounters:  08/03/17 5\' 5"  (1.651 m)         Assessment/Plan:    @[email or web address] @  Naomie DeanAntonia Ofelia Podolski, MD  [facility] [address] [place], [place] [id number]  Phone [phone] Fax [phone]

## 2017-08-30 ENCOUNTER — Telehealth: Payer: Self-pay | Admitting: *Deleted

## 2017-08-30 ENCOUNTER — Ambulatory Visit: Payer: Self-pay | Admitting: Neurology

## 2017-08-30 NOTE — Telephone Encounter (Signed)
Patient no show new pt appt that was r/s from [place].

## 2017-09-05 ENCOUNTER — Encounter: Payer: Self-pay | Admitting: Neurology

## 2017-09-17 ENCOUNTER — Encounter (HOSPITAL_COMMUNITY): Payer: Self-pay | Admitting: Emergency Medicine

## 2017-09-17 ENCOUNTER — Emergency Department (HOSPITAL_COMMUNITY)
Admission: EM | Admit: 2017-09-17 | Discharge: 2017-09-17 | Disposition: A | Discharge status: Home / Self Care | Payer: Self-pay | Attending: Emergency Medicine | Admitting: Emergency Medicine

## 2017-09-17 ENCOUNTER — Emergency Department (HOSPITAL_COMMUNITY): Payer: Self-pay

## 2017-09-17 DIAGNOSIS — F17290 Nicotine dependence, other tobacco product, uncomplicated: Secondary | ICD-10-CM | POA: Insufficient documentation

## 2017-09-17 DIAGNOSIS — R1030 Lower abdominal pain, unspecified: Secondary | ICD-10-CM

## 2017-09-17 DIAGNOSIS — E119 Type 2 diabetes mellitus without complications: Secondary | ICD-10-CM | POA: Insufficient documentation

## 2017-09-17 DIAGNOSIS — Z91040 Latex allergy status: Secondary | ICD-10-CM | POA: Insufficient documentation

## 2017-09-17 DIAGNOSIS — Z79899 Other long term (current) drug therapy: Secondary | ICD-10-CM | POA: Insufficient documentation

## 2017-09-17 DIAGNOSIS — R51 Headache: Secondary | ICD-10-CM | POA: Insufficient documentation

## 2017-09-17 DIAGNOSIS — N83201 Unspecified ovarian cyst, right side: Secondary | ICD-10-CM | POA: Insufficient documentation

## 2017-09-17 LAB — CBC
HCT: 38.6 % (ref 36.0–46.0)
Hemoglobin: 12.3 g/dL (ref 12.0–15.0)
MCH: 24.2 pg — AB (ref 26.0–34.0)
MCHC: 31.9 g/dL (ref 30.0–36.0)
MCV: 76 fL — ABNORMAL LOW (ref 78.0–100.0)
Platelets: 312 10*3/uL (ref 150–400)
RBC: 5.08 MIL/uL (ref 3.87–5.11)
RDW: 15.6 % — ABNORMAL HIGH (ref 11.5–15.5)
WBC: 9.7 10*3/uL (ref 4.0–10.5)

## 2017-09-17 LAB — URINALYSIS, ROUTINE W REFLEX MICROSCOPIC
BACTERIA UA: NONE SEEN
Bilirubin Urine: NEGATIVE
Glucose, UA: NEGATIVE mg/dL
KETONES UR: 5 mg/dL — AB
Leukocytes, UA: NEGATIVE
Nitrite: NEGATIVE
PH: 8 (ref 5.0–8.0)
Protein, ur: NEGATIVE mg/dL
Specific Gravity, Urine: 1.013 (ref 1.005–1.030)

## 2017-09-17 LAB — COMPREHENSIVE METABOLIC PANEL
ALT: 16 U/L (ref 14–54)
AST: 19 U/L (ref 15–41)
Albumin: 3.6 g/dL (ref 3.5–5.0)
Alkaline Phosphatase: 91 U/L (ref 38–126)
Anion gap: 11 (ref 5–15)
BUN: 13 mg/dL (ref 6–20)
CHLORIDE: 102 mmol/L (ref 101–111)
CO2: 26 mmol/L (ref 22–32)
CREATININE: 0.7 mg/dL (ref 0.44–1.00)
Calcium: 8.9 mg/dL (ref 8.9–10.3)
GFR calc non Af Amer: >60 mL/min (ref >60)
Glucose, Bld: 92 mg/dL (ref 65–99)
Potassium: 4.1 mmol/L (ref 3.5–5.1)
SODIUM: 139 mmol/L (ref 135–145)
Total Bilirubin: 0.4 mg/dL (ref 0.3–1.2)
Total Protein: 7.2 g/dL (ref 6.5–8.1)

## 2017-09-17 LAB — WET PREP, GENITAL
CLUE CELLS WET PREP: NONE SEEN
SPERM: NONE SEEN
Trich, Wet Prep: NONE SEEN
YEAST WET PREP: NONE SEEN

## 2017-09-17 LAB — RAPID HIV SCREEN (HIV 1/2 AB+AG)
HIV 1/2 Antibodies: NONREACTIVE
HIV-1 P24 Antigen - HIV24: NONREACTIVE

## 2017-09-17 LAB — LIPASE, BLOOD: LIPASE: 52 U/L — AB (ref 11–51)

## 2017-09-17 LAB — POC URINE PREG, ED: Preg Test, Ur: NEGATIVE

## 2017-09-17 MED ORDER — AZITHROMYCIN 250 MG PO TABS
1000.0000 mg | ORAL_TABLET | Freq: Once | ORAL | Status: AC
  Administered 2017-09-17: 1000 mg via ORAL
  Filled 2017-09-17: qty 4

## 2017-09-17 MED ORDER — ONDANSETRON HCL 4 MG/2ML IJ SOLN
4.0000 mg | Freq: Once | INTRAMUSCULAR | Status: AC
  Administered 2017-09-17: 4 mg via INTRAVENOUS
  Filled 2017-09-17: qty 2

## 2017-09-17 MED ORDER — MORPHINE SULFATE (PF) 4 MG/ML IV SOLN
6.0000 mg | Freq: Once | INTRAVENOUS | Status: AC
  Administered 2017-09-17: 6 mg via INTRAVENOUS
  Filled 2017-09-17: qty 2

## 2017-09-17 MED ORDER — MORPHINE SULFATE (PF) 4 MG/ML IV SOLN
4.0000 mg | Freq: Once | INTRAVENOUS | Status: AC
  Administered 2017-09-17: 4 mg via INTRAVENOUS
  Filled 2017-09-17: qty 1

## 2017-09-17 MED ORDER — SODIUM CHLORIDE 0.9 % IV BOLUS (SEPSIS)
1000.0000 mL | Freq: Once | INTRAVENOUS | Status: AC
  Administered 2017-09-17: 1000 mL via INTRAVENOUS

## 2017-09-17 MED ORDER — ONDANSETRON HCL 4 MG PO TABS: 4.0000 mg | ORAL_TABLET | Freq: Three times a day (TID) | ORAL | 0 refills | Status: DC | PRN

## 2017-09-17 MED ORDER — IOPAMIDOL (ISOVUE-300) INJECTION 61%
INTRAVENOUS | Status: AC
  Administered 2017-09-17: 80 mL via INTRAVENOUS
  Filled 2017-09-17: qty 100

## 2017-09-17 MED ORDER — LIDOCAINE HCL (PF) 1 % IJ SOLN
INTRAMUSCULAR | Status: AC
  Administered 2017-09-17: 0.9 mL
  Filled 2017-09-17: qty 5

## 2017-09-17 MED ORDER — IBUPROFEN 800 MG PO TABS: 800.0000 mg | ORAL_TABLET | Freq: Three times a day (TID) | ORAL | 0 refills | Status: DC

## 2017-09-17 MED ORDER — CEFTRIAXONE SODIUM 250 MG IJ SOLR
250.0000 mg | Freq: Once | INTRAMUSCULAR | Status: AC
  Administered 2017-09-17: 250 mg via INTRAMUSCULAR
  Filled 2017-09-17: qty 250

## 2017-09-17 NOTE — ED Triage Notes (Signed)
Pt here from home with c/o abd pain that started yesterday , pt with n/v

## 2017-09-17 NOTE — ED Provider Notes (Signed)
[facility] EMERGENCY DEPARTMENT Provider Note   CSN: [id number] Arrival date & time: 09/17/17  4098     History   Chief Complaint Chief Complaint  Patient presents with  . Abdominal Pain    HPI Ashlee Thompson is a 22 y.o. female.  HPI  22 year old obese female with history of diabetes, GI bleed presenting here for evaluation of abdominal pain.  Patient states she has history of endometriosis and vertebral abdominal pain on a monthly basis.  For the past 4 days she has had throbbing bilateral lateral temporal headache, along with occasional sharp shooting pain towards her Chest, as well as significant lower abdominal pain.  Her abdominal pain is described as a sharp "popping" sensation with occasional spasm down her rectum.  Pain is waxing waning, with associated nausea and vomiting.  States she has vomited approximately 4 times of food and liquid content.  Also has history of hemorrhoid.  Complaining of vaginal bleeding for the last several days which she attributed to her menstrual period.  She also endorses significant chills.  Over-the-counter medication at home provided no relief.  Past Medical History:  Diagnosis Date  . Anxiety   . Bilateral ovarian cysts   . Diabetes mellitus without complication (HCC)   . GI bleed due to NSAIDs   . Myocardial infarction (HCC)   . Pseudoseizures     Patient Active Problem List   Diagnosis Date Noted  . Headache   . UGI bleed 07/07/2016  . History of diabetes mellitus 07/07/2016  . Morbid obesity (HCC) 07/07/2016  . Hematemesis with nausea 07/07/2016  . GI bleed 07/07/2016    Past Surgical History:  Procedure Laterality Date  . ESOPHAGOGASTRODUODENOSCOPY N/A 07/07/2016   Procedure: ESOPHAGOGASTRODUODENOSCOPY (EGD);  Surgeon: Ruffin Frederick, MD;  Location: [facility] ENDOSCOPY;  Service: Gastroenterology;  Laterality: N/A;    OB History    Gravida Para Term Preterm AB Living   0 0 0 0 0 0   SAB TAB Ectopic  Multiple Live Births   0 0 0 0 0       Home Medications    Prior to Admission medications   Medication Sig Start Date End Date Taking? Authorizing Provider  diphenhydrAMINE (BENADRYL) 12.5 MG/5ML liquid Take 12.5 mg 4 (four) times daily as needed by mouth for allergies.    [provider]  etonogestrel (NEXPLANON) 68 MG IMPL implant 1 each by Subdermal route continuous.    [provider]  FLUoxetine (PROZAC) 10 MG capsule Take 10 mg by mouth daily.    [provider]  HYDROcodone-acetaminophen (NORCO/VICODIN) 5-325 MG tablet Take 1 tablet 3 (three) times daily as needed by mouth for severe pain. 08/03/17   Gerhard Munch, MD  hydrocortisone (PROCTOSOL HC) 2.5 % rectal cream Place 1 application rectally 2 (two) times daily as needed for hemorrhoids or anal itching.    [provider]  LORazepam (ATIVAN) 0.5 MG tablet Take 1 tablet (0.5 mg total) every 8 (eight) hours as needed by mouth for anxiety. 08/03/17   Gerhard Munch, MD  Naproxen Sodium 220 MG CAPS Take 440 mg by mouth daily as needed (for pain).    [provider]  omeprazole (PRILOSEC) 40 MG capsule Take 40 mg by mouth 2 (two) times daily as needed (for reflux).  06/08/17   [provider]  ondansetron (ZOFRAN) 4 MG tablet Take 1 tablet (4 mg total) by mouth every 8 (eight) hours as needed for nausea or vomiting. 06/10/17   Tegeler,  Canary Brim, MD    Family History Family History  Problem Relation Age of Onset  . Hyperlipidemia Mother   . Mental illness Mother   . Hyperlipidemia Father   . Mental illness Brother   . Heart disease Paternal Grandfather   . Hyperlipidemia Paternal Grandfather   . Hypertension Paternal Grandfather   . CAD Other   . Cancer Other     Social History Social History   Tobacco Use  . Smoking status: Current Some Day Smoker    Packs/day: 0.25    Types: Cigars  . Smokeless tobacco: Never Used  Substance Use Topics  . Alcohol use: Yes     Comment: social  . Drug use: No     Allergies   Bentyl [dicyclomine hcl]; Haldol [haloperidol lactate]; Tramadol; and Latex   Review of Systems Review of Systems  All other systems reviewed and are negative.    Physical Exam Updated Vital Signs BP 131/79 (BP Location: Right Arm)   Pulse 75   Temp 97.6 F (36.4 C) (Oral)   Resp 18   SpO2 100%   Physical Exam  Constitutional: She appears well-developed and well-nourished. No distress.  HENT:  Head: Atraumatic.  Eyes: Conjunctivae are normal.  Neck: Neck supple.  Cardiovascular: Normal rate and regular rhythm.  Pulmonary/Chest: Effort normal and breath sounds normal.  Abdominal: She exhibits no distension. Bowel sounds are decreased. There is generalized tenderness. There is no tenderness at McBurney's point and negative Murphy's sign. Hernia confirmed negative in the ventral area, confirmed negative in the right inguinal area and confirmed negative in the left inguinal area.  Genitourinary:  Genitourinary Comments: Chaperone present during exam.  Left inguinal lymphopathy but no inguinal hernia noted.  Normal external genitalia.  Mild discomfort with speculum insertion.  Moderate amount of blood noted in vaginal vault.  Cervical os closed.  On bimanual examination left adnexal tenderness without cervical motion tenderness.  Exam limited due to large body habitus.  Neurological: She is alert.  Skin: No rash noted.  Psychiatric: She has a normal mood and affect.  Nursing note and vitals reviewed.    ED Treatments / Results  Labs (all labs ordered are listed, but only abnormal results are displayed) Labs Reviewed  WET PREP, GENITAL - Abnormal; Notable for the following components:      Result Value   WBC, Wet Prep HPF POC MODERATE (*)    All other components within normal limits  LIPASE, BLOOD - Abnormal; Notable for the following components:   Lipase 52 (*)    All other components within normal limits  CBC -  Abnormal; Notable for the following components:   MCV 76.0 (*)    MCH 24.2 (*)    RDW 15.6 (*)    All other components within normal limits  URINALYSIS, ROUTINE W REFLEX MICROSCOPIC - Abnormal; Notable for the following components:   Color, Urine STRAW (*)    Hgb urine dipstick LARGE (*)    Ketones, ur 5 (*)    Squamous Epithelial / LPF 0-5 (*)    All other components within normal limits  COMPREHENSIVE METABOLIC PANEL  RAPID HIV SCREEN (HIV 1/2 AB+AG)  URINALYSIS, ROUTINE W REFLEX MICROSCOPIC  RPR  POC URINE PREG, ED  GC/CHLAMYDIA PROBE AMP ([place]) NOT AT [facility]    EKG  EKG Interpretation None       Radiology Ct Abdomen Pelvis W Contrast  Result Date: 09/17/2017 CLINICAL DATA:  Abdominal pain with nausea and vomiting EXAM: CT  ABDOMEN AND PELVIS WITH CONTRAST TECHNIQUE: Multidetector CT imaging of the abdomen and pelvis was performed using the standard protocol following bolus administration of intravenous contrast. CONTRAST:  80 mL Isovue 300 COMPARISON:  08/03/2017 FINDINGS: Lower chest: No acute abnormality. Hepatobiliary: No focal liver abnormality is seen. No gallstones, gallbladder wall thickening, or biliary dilatation. Pancreas: Unremarkable. No pancreatic ductal dilatation or surrounding inflammatory changes. Spleen: Normal in size without focal abnormality. Adrenals/Urinary Tract: The adrenal glands are within normal limits. The kidneys are well visualized bilaterally. No renal calculi or obstructive changes are seen. The bladder is partially distended. Stomach/Bowel: Sliding-type hiatal hernia. The appendix is within normal limits. No obstructive or inflammatory changes of the bowel are noted. Vascular/Lymphatic: No significant vascular findings are present. No enlarged abdominal or pelvic lymph nodes. Reproductive: 3.9 cm cyst is noted within the right ovary. This has increased slightly in size when compare with the prior exam. Other: No abdominal wall hernia or  abnormality. No abdominopelvic ascites. Musculoskeletal: No acute or significant osseous findings. IMPRESSION: Sliding-type hiatal hernia. Right ovarian cyst. No other focal abnormality is noted. Electronically Signed   By: Alcide Clever M.D.   On: 09/17/2017 13:53    Procedures Procedures (including critical care time)  Medications Ordered in ED Medications  cefTRIAXone (ROCEPHIN) injection 250 mg (not administered)  azithromycin (ZITHROMAX) tablet 1,000 mg (not administered)  sodium chloride 0.9 % bolus 1,000 mL (0 mLs Intravenous Stopped 09/17/17 1200)  morphine 4 MG/ML injection 4 mg (4 mg Intravenous Given 09/17/17 1102)  ondansetron (ZOFRAN) injection 4 mg (4 mg Intravenous Given 09/17/17 1058)  morphine 4 MG/ML injection 6 mg (6 mg Intravenous Given 09/17/17 1234)  iopamidol (ISOVUE-300) 61 % injection (80 mLs Intravenous Contrast Given 09/17/17 1345)     Initial Impression / Assessment and Plan / ED Course  I have reviewed the triage vital signs and the nursing notes.  Pertinent labs & imaging results that were available during my care of the patient were reviewed by me and considered in my medical decision making (see chart for details).     BP 114/68   Pulse 85   Temp 97.6 F (36.4 C) (Oral)   Resp 18   SpO2 100%    Final Clinical Impressions(s) / ED Diagnoses   Final diagnoses:  Cyst of right ovary  Lower abdominal pain    ED Discharge Orders        Ordered    ibuprofen (ADVIL,MOTRIN) 800 MG tablet  3 times daily     09/17/17 1458    ondansetron (ZOFRAN) 4 MG tablet  Every 8 hours PRN     09/17/17 1458     12:16 PM Patient here with pain to the lower abdomen.  History of ovarian cyst.  History of endometriosis.  She is appears to be in moderate abdominal discomfort.  She does have left adnexal tenderness on pelvic examination.  Symptoms ongoing for the past several days, unlikely to be torsion.  Suspect ruptured ovarian cyst causing her pain.  However, will obtain  abdominal pelvic CT scan for further evaluation.  Pain medication given.  Last new sexual partner was last September.  No vaginal discharge.  2:53 PM Pregnancy test negative, UA shows no signs of urinary tract infection, moderate amount of blood noted in urine but this is likely coming from her vaginal bleeding.  Wet prep shows moderate WBC I discussed this finding with patient and she prefers to receive prophylactic antibiotic including Rocephin and Zithromax in the ER.  Labs otherwise reassuring, abdominal pelvis CT scan shows right ovarian cyst, sliding type hiatal hernia but no other concerning feature.  At this time, patient's pain is well controlled, she is stable for discharge.  She will receive antibiotics while in the ER and she will be discharged home with anti-inflammatory medication.  Return precautions discussed.   Fayrene Helperran, Carli Lefevers, PA-C 09/17/17 1501    Raeford RazorKohut, Stephen, MD 09/17/17 [phone]

## 2017-09-17 NOTE — ED Notes (Signed)
Patient aware that a urine sample is needed 

## 2017-09-17 NOTE — Discharge Instructions (Signed)
Your abd pain is likely due to your ovarian cyst.  Take ibuprofen as needed for pain, take zofran as needed for nausea.  Followup with your doctor for further care.  Return if you have any concerns.

## 2017-09-18 LAB — GC/CHLAMYDIA PROBE AMP (~~LOC~~) NOT AT ARMC
Chlamydia: NEGATIVE
Neisseria Gonorrhea: NEGATIVE

## 2017-09-19 LAB — RPR: RPR: NONREACTIVE

## 2017-10-18 ENCOUNTER — Emergency Department (HOSPITAL_COMMUNITY)
Admission: EM | Admit: 2017-10-18 | Discharge: 2017-10-18 | Disposition: A | Discharge status: Home / Self Care | Payer: Self-pay | Attending: Emergency Medicine | Admitting: Emergency Medicine

## 2017-10-18 ENCOUNTER — Other Ambulatory Visit: Payer: Self-pay

## 2017-10-18 DIAGNOSIS — R519 Headache, unspecified: Secondary | ICD-10-CM

## 2017-10-18 DIAGNOSIS — F17290 Nicotine dependence, other tobacco product, uncomplicated: Secondary | ICD-10-CM | POA: Insufficient documentation

## 2017-10-18 DIAGNOSIS — R51 Headache: Secondary | ICD-10-CM | POA: Insufficient documentation

## 2017-10-18 DIAGNOSIS — Z7984 Long term (current) use of oral hypoglycemic drugs: Secondary | ICD-10-CM | POA: Insufficient documentation

## 2017-10-18 DIAGNOSIS — F419 Anxiety disorder, unspecified: Secondary | ICD-10-CM | POA: Insufficient documentation

## 2017-10-18 DIAGNOSIS — Z79899 Other long term (current) drug therapy: Secondary | ICD-10-CM | POA: Insufficient documentation

## 2017-10-18 DIAGNOSIS — F445 Conversion disorder with seizures or convulsions: Secondary | ICD-10-CM | POA: Insufficient documentation

## 2017-10-18 DIAGNOSIS — E119 Type 2 diabetes mellitus without complications: Secondary | ICD-10-CM | POA: Insufficient documentation

## 2017-10-18 MED ORDER — HYDROXYZINE HCL 25 MG PO TABS
25.0000 mg | ORAL_TABLET | Freq: Once | ORAL | Status: AC
  Administered 2017-10-18: 25 mg via ORAL
  Filled 2017-10-18: qty 1

## 2017-10-18 MED ORDER — METOCLOPRAMIDE HCL 5 MG/ML IJ SOLN
10.0000 mg | Freq: Once | INTRAMUSCULAR | Status: AC
  Administered 2017-10-18: 10 mg via INTRAMUSCULAR
  Filled 2017-10-18: qty 2

## 2017-10-18 MED ORDER — KETOROLAC TROMETHAMINE 30 MG/ML IJ SOLN
30.0000 mg | Freq: Once | INTRAMUSCULAR | Status: AC
  Administered 2017-10-18: 30 mg via INTRAMUSCULAR
  Filled 2017-10-18: qty 1

## 2017-10-18 MED ORDER — ACETAMINOPHEN 325 MG PO TABS
650.0000 mg | ORAL_TABLET | Freq: Once | ORAL | Status: AC
  Administered 2017-10-18: 650 mg via ORAL
  Filled 2017-10-18: qty 2

## 2017-10-18 NOTE — ED Provider Notes (Signed)
[facility]-EMERGENCY DEPT Provider Note   CSN: [id number] Arrival date & time: 10/18/17  1721     History   Chief Complaint Chief Complaint  Patient presents with  . Panic Attack    psuedo- seizure    HPI Ashlee Thompson is a 22 y.o. female.  HPI Patient presents with possible seizure.  States that she has had this happen before.  States she was at work and her eyes began to flutter and then she passed out and had a seizure.  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  Has been under a lot of stress recently.  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  She recently had a family member move in with children to her house.  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  Also is working to help support everyone.  Has a headache.  It is tightness on both of her temple areas.  Has a history of migraines but states that usually feels a little different.  No vision changes and states her eyes were fluttering.  Has had constipation for 2 days.  History of irritable bowel.  No diarrhea.  No fevers.  No cough. Past Medical History:  Diagnosis Date  . Anxiety   . Bilateral ovarian cysts   . Diabetes mellitus without complication (HCC)   . GI bleed due to NSAIDs   . Myocardial infarction (HCC)   . Pseudoseizures     Patient Active Problem List   Diagnosis Date Noted  . Headache   . UGI bleed 07/07/2016  . History of diabetes mellitus 07/07/2016  . Morbid obesity (HCC) 07/07/2016  . Hematemesis with nausea 07/07/2016  . GI bleed 07/07/2016    Past Surgical History:  Procedure Laterality Date  . ESOPHAGOGASTRODUODENOSCOPY N/A 07/07/2016   Procedure: ESOPHAGOGASTRODUODENOSCOPY (EGD);  Surgeon: Ruffin Frederick, MD;  Location: [facility] ENDOSCOPY;  Service: Gastroenterology;  Laterality: N/A;    OB History    Gravida Para Term Preterm AB Living   0 0 0 0 0 0   SAB TAB Ectopic Multiple Live Births   0 0 0 0 0       Home Medications     Prior to Admission medications   Medication Sig Start Date End Date Taking? Authorizing Provider  diphenhydrAMINE (BENADRYL) 12.5 MG/5ML liquid Take 12.5 mg 4 (four) times daily as needed by mouth for allergies.   Yes [provider]  etonogestrel (NEXPLANON) 68 MG IMPL implant 1 each by Subdermal route continuous.   Yes [provider]  FLUoxetine (PROZAC) 10 MG capsule Take 10 mg by mouth daily.   Yes [provider]  HYDROcodone-acetaminophen (NORCO/VICODIN) 5-325 MG tablet Take 1 tablet 3 (three) times daily as needed by mouth for severe pain. 08/03/17  Yes Gerhard Munch, MD  hydrocortisone (PROCTOSOL HC) 2.5 % rectal cream Place 1 application rectally 2 (two) times daily as needed for hemorrhoids or anal itching.   Yes [provider]  ibuprofen (ADVIL,MOTRIN) 800 MG tablet Take 1 tablet (800 mg total) by mouth 3 (three) times daily. 09/17/17  Yes Fayrene Helper, PA-C  omeprazole (PRILOSEC) 40 MG capsule Take 40 mg by mouth 2 (two) times daily as needed (for reflux).  06/08/17  Yes [provider]  ondansetron (ZOFRAN) 4 MG tablet Take 1 tablet (4 mg total) by mouth every 8 (eight) hours as needed for nausea or vomiting. 09/17/17  Yes Fayrene Helper, PA-C  LORazepam (ATIVAN) 0.5 MG tablet Take 1 tablet (0.5 mg total) every 8 (eight) hours as needed by mouth for anxiety. Patient not taking: Reported on 10/18/2017 08/03/17   Gerhard Munch, MD  Naproxen Sodium 220 MG CAPS Take 440 mg by mouth daily as needed (for pain).    [provider]  medroxyPROGESTERone (PROVERA) 10 MG tablet Take 1 tablet (10 mg total) by mouth daily. Patient not taking: Reported on 08/21/2015 07/19/15 11/10/15  Charm Rings, MD  metFORMIN (GLUCOPHAGE) 500 MG tablet Take 500 mg by mouth 2 (two) times daily with a meal.  03/16/16  [provider]  norgestimate-ethinyl estradiol (SPRINTEC 28) 0.25-35 MG-MCG tablet Take 1 tablet by mouth daily. Patient not taking:  Reported on 08/21/2015 07/14/15 11/10/15  Mesner, Barbara Cower, MD    Family History Family History  Problem Relation Age of Onset  . Hyperlipidemia Mother   . Mental illness Mother   . Hyperlipidemia Father   . Mental illness Brother   . Heart disease Paternal Grandfather   . Hyperlipidemia Paternal Grandfather   . Hypertension Paternal Grandfather   . CAD Other   . Cancer Other     Social History Social History   Tobacco Use  . Smoking status: Current Some Day Smoker    Packs/day: 0.25    Types: Cigars  . Smokeless tobacco: Never Used  Substance Use Topics  . Alcohol use: Yes    Comment: social  . Drug use: No     Allergies   Bentyl [dicyclomine hcl]; Haldol [haloperidol lactate]; Tramadol; and Latex   Review of Systems Review of Systems  Constitutional: Negative for appetite change.  HENT: Negative for congestion.   Respiratory: Negative for chest tightness and shortness of breath.   Cardiovascular: Negative for chest pain.  Gastrointestinal: Positive for constipation. Negative for abdominal pain.  Genitourinary: Negative for flank pain.  Musculoskeletal: Negative for back pain.  Neurological: Positive for syncope and headaches.  Hematological: Negative for adenopathy.  Psychiatric/Behavioral: Negative for confusion.     Physical Exam Updated Vital Signs BP 135/69 (BP Location: Left Arm)   Pulse 88   Temp 98.2 F (36.8 C) (Oral)   Resp 18   Ht 5\' 5"  (1.651 m)   Wt 119.3 kg (263 lb)   SpO2 95%   BMI 43.77 kg/m   Physical Exam  Constitutional: She is oriented to person, place, and time. She appears well-developed.  HENT:  Head: Atraumatic.  Eyes: EOM are normal.  Neck: Neck supple.  Cardiovascular: Normal rate.  Pulmonary/Chest: Effort normal.  Abdominal: Soft. She exhibits no distension. There is no tenderness.  Musculoskeletal: She exhibits no tenderness.  Neurological: She is alert and oriented to person, place, and time.  Skin: Skin is warm.   Psychiatric:  Patient appears somewhat anxious     ED Treatments / Results  Labs (all labs ordered are listed, but only abnormal results are displayed) Labs Reviewed - No data to display  EKG  EKG Interpretation None       Radiology No results found.  Procedures Procedures (including critical care time)  Medications Ordered in ED Medications  hydrOXYzine (ATARAX/VISTARIL) tablet 25 mg (25 mg Oral Given 10/18/17 1805)  acetaminophen (TYLENOL) tablet 650 mg (650 mg Oral Given 10/18/17 1839)  ketorolac (TORADOL) 30 MG/ML injection 30 mg (30 mg Intramuscular Given 10/18/17 2117)  metoCLOPramide (REGLAN) injection 10 mg (10 mg Intramuscular Given 10/18/17 2117)     Initial Impression / Assessment and Plan / ED Course  I  have reviewed the triage vital signs and the nursing notes.  Pertinent labs & imaging results that were available during my care of the patient were reviewed by me and considered in my medical decision making (see chart for details).     Patient with likely pseudoseizure.  History of same.  Reviewed previous visits.  History of pseudoseizures and headaches.  Treated anxiety and then headache here.  After treatment patient feels better and will be discharged home.  Final Clinical Impressions(s) / ED Diagnoses   Final diagnoses:  Nonintractable headache, unspecified chronicity pattern, unspecified headache type  Pseudoseizure    ED Discharge Orders    None       Benjiman CorePickering, Akasha Melena, MD 10/18/17 2242

## 2017-10-18 NOTE — ED Notes (Signed)
Bed: WA20 Expected date:  Expected time:  Means of arrival:  Comments: 22 yo seizure?

## 2017-10-18 NOTE — ED Triage Notes (Addendum)
Pt brought to ED via [facility] from work with complaints of seizure like activity. Pt has history pseudo seizures and is currently under a lot of "stress" . Pt has history of anxiety, depression, and panic attacks. No loc or fall and no mouth trauma or incontinence, no post ictal periods.   Vitals cbg 105 141/72 Pulse 88 100 2L

## 2017-10-21 ENCOUNTER — Inpatient Hospital Stay (HOSPITAL_COMMUNITY)
Admission: AD | Admit: 2017-10-21 | Discharge: 2017-10-21 | Disposition: A | Discharge status: Home / Self Care | Payer: Medicaid Other | Source: Ambulatory Visit | Attending: Obstetrics and Gynecology | Admitting: Obstetrics and Gynecology

## 2017-10-21 ENCOUNTER — Encounter (HOSPITAL_COMMUNITY): Payer: Self-pay | Admitting: *Deleted

## 2017-10-21 DIAGNOSIS — F17290 Nicotine dependence, other tobacco product, uncomplicated: Secondary | ICD-10-CM | POA: Insufficient documentation

## 2017-10-21 DIAGNOSIS — N946 Dysmenorrhea, unspecified: Secondary | ICD-10-CM

## 2017-10-21 DIAGNOSIS — Z3202 Encounter for pregnancy test, result negative: Secondary | ICD-10-CM

## 2017-10-21 HISTORY — DX: Unspecified convulsions: R56.9

## 2017-10-21 LAB — COMPREHENSIVE METABOLIC PANEL
ALT: 13 U/L — ABNORMAL LOW (ref 14–54)
AST: 18 U/L (ref 15–41)
Albumin: 3.7 g/dL (ref 3.5–5.0)
Alkaline Phosphatase: 88 U/L (ref 38–126)
Anion gap: 6 (ref 5–15)
BILIRUBIN TOTAL: 0.4 mg/dL (ref 0.3–1.2)
BUN: 8 mg/dL (ref 6–20)
CALCIUM: 8.8 mg/dL — AB (ref 8.9–10.3)
CHLORIDE: 106 mmol/L (ref 101–111)
CO2: 25 mmol/L (ref 22–32)
Creatinine, Ser: 0.67 mg/dL (ref 0.44–1.00)
Glucose, Bld: 103 mg/dL — ABNORMAL HIGH (ref 65–99)
Potassium: 3.9 mmol/L (ref 3.5–5.1)
Sodium: 137 mmol/L (ref 135–145)
TOTAL PROTEIN: 7.3 g/dL (ref 6.5–8.1)

## 2017-10-21 LAB — URINALYSIS, ROUTINE W REFLEX MICROSCOPIC
BILIRUBIN URINE: NEGATIVE
Bacteria, UA: NONE SEEN
GLUCOSE, UA: NEGATIVE mg/dL
KETONES UR: NEGATIVE mg/dL
Nitrite: NEGATIVE
PH: 6 (ref 5.0–8.0)
Protein, ur: NEGATIVE mg/dL
Specific Gravity, Urine: 1.019 (ref 1.005–1.030)

## 2017-10-21 LAB — CBC
HEMATOCRIT: 38.3 % (ref 36.0–46.0)
Hemoglobin: 12.1 g/dL (ref 12.0–15.0)
MCH: 24 pg — AB (ref 26.0–34.0)
MCHC: 31.6 g/dL (ref 30.0–36.0)
MCV: 75.8 fL — AB (ref 78.0–100.0)
PLATELETS: 277 10*3/uL (ref 150–400)
RBC: 5.05 MIL/uL (ref 3.87–5.11)
RDW: 15.3 % (ref 11.5–15.5)
WBC: 7.8 10*3/uL (ref 4.0–10.5)

## 2017-10-21 LAB — POCT PREGNANCY, URINE: Preg Test, Ur: NEGATIVE

## 2017-10-21 LAB — LIPASE, BLOOD: LIPASE: 33 U/L (ref 11–51)

## 2017-10-21 MED ORDER — ONDANSETRON 8 MG PO TBDP
8.0000 mg | ORAL_TABLET | Freq: Once | ORAL | Status: AC
  Administered 2017-10-21: 8 mg via ORAL
  Filled 2017-10-21: qty 1

## 2017-10-21 MED ORDER — KETOROLAC TROMETHAMINE 60 MG/2ML IM SOLN
60.0000 mg | Freq: Once | INTRAMUSCULAR | Status: AC
  Administered 2017-10-21: 60 mg via INTRAMUSCULAR
  Filled 2017-10-21: qty 2

## 2017-10-21 MED ORDER — OXYCODONE-ACETAMINOPHEN 5-325 MG PO TABS
2.0000 | ORAL_TABLET | Freq: Once | ORAL | Status: AC
  Administered 2017-10-21: 2 via ORAL
  Filled 2017-10-21: qty 2

## 2017-10-21 MED ORDER — ONDANSETRON 4 MG PO TBDP: 4.0000 mg | ORAL_TABLET | Freq: Three times a day (TID) | ORAL | 0 refills | Status: DC | PRN

## 2017-10-21 MED ORDER — OXYCODONE-ACETAMINOPHEN 5-325 MG PO TABS: 1.0000 | ORAL_TABLET | ORAL | 0 refills | Status: DC | PRN

## 2017-10-21 NOTE — MAU Provider Note (Signed)
Chief Complaint: Endometriosis   None     SUBJECTIVE HPI: Ashlee Thompson is a 22 y.o. G1P0010 who presents to maternity admissions reporting onset of menses 2 days ago associated with severe lower abdominal cramping.  She has hx of pain with menses, possible endometriosis but has not had surgery to confirm diagnosis.  She is using Nexplanon for contraception so has irregular menses that are light but still has significant pain with her periods. This pain is low in her abdomen, bilateral, cramping pain with intermittent sharp pain. The pain radiates to her low back and is associated with n/v.  She is taking ibuprofen, last dose last night at 11 pm but it has not helped.  There are no other associated symptoms. She has no tried any other treatments.     HPI  Past Medical History:  Diagnosis Date  . Anxiety   . Bilateral ovarian cysts   . Diabetes mellitus without complication (HCC)    prediabetic  . GI bleed due to NSAIDs   . Myocardial infarction (HCC)   . Pseudoseizures   . Seizures (HCC)    pseudoseizures   Past Surgical History:  Procedure Laterality Date  . ESOPHAGOGASTRODUODENOSCOPY N/A 07/07/2016   Procedure: ESOPHAGOGASTRODUODENOSCOPY (EGD);  Surgeon: Ruffin Frederick, MD;  Location: [facility] ENDOSCOPY;  Service: Gastroenterology;  Laterality: N/A;  . HEMORROIDECTOMY     Social History   Socioeconomic History  . Marital status: Single    Spouse name: Not on file  . Number of children: Not on file  . Years of education: Not on file  . Highest education level: Not on file  Social Needs  . Financial resource strain: Not on file  . Food insecurity - worry: Not on file  . Food insecurity - inability: Not on file  . Transportation needs - medical: Not on file  . Transportation needs - non-medical: Not on file  Occupational History  . Not on file  Tobacco Use  . Smoking status: Current Some Day Smoker    Packs/day: 0.25    Types: Cigars  . Smokeless tobacco: Never  Used  Substance and Sexual Activity  . Alcohol use: Yes    Comment: social  . Drug use: No  . Sexual activity: Yes    Birth control/protection: Implant  Other Topics Concern  . Not on file  Social History Narrative  . Not on file   No current facility-administered medications on file prior to encounter.    Current Outpatient Medications on File Prior to Encounter  Medication Sig Dispense Refill  . diphenhydrAMINE (BENADRYL) 12.5 MG/5ML liquid Take 12.5 mg 4 (four) times daily as needed by mouth for allergies.    Marland Kitchen etonogestrel (NEXPLANON) 68 MG IMPL implant 1 each by Subdermal route continuous.    Marland Kitchen FLUoxetine (PROZAC) 10 MG capsule Take 10 mg by mouth daily.    Marland Kitchen HYDROcodone-acetaminophen (NORCO/VICODIN) 5-325 MG tablet Take 1 tablet 3 (three) times daily as needed by mouth for severe pain. 15 tablet 0  . hydrocortisone (PROCTOSOL HC) 2.5 % rectal cream Place 1 application rectally 2 (two) times daily as needed for hemorrhoids or anal itching.    Marland Kitchen ibuprofen (ADVIL,MOTRIN) 800 MG tablet Take 1 tablet (800 mg total) by mouth 3 (three) times daily. 21 tablet 0  . LORazepam (ATIVAN) 0.5 MG tablet Take 1 tablet (0.5 mg total) every 8 (eight) hours as needed by mouth for anxiety. (Patient not taking: Reported on 10/18/2017) 15 tablet 0  . Naproxen Sodium 220 MG  CAPS Take 440 mg by mouth daily as needed (for pain).    Marland Kitchen. omeprazole (PRILOSEC) 40 MG capsule Take 40 mg by mouth 2 (two) times daily as needed (for reflux).   1  . ondansetron (ZOFRAN) 4 MG tablet Take 1 tablet (4 mg total) by mouth every 8 (eight) hours as needed for nausea or vomiting. 12 tablet 0   Allergies  Allergen Reactions  . Bentyl [Dicyclomine Hcl] Cough    Coughs up blood  . Haldol [Haloperidol Lactate] Other (See Comments)    Psychotic episode  . Tramadol Hives and Itching  . Latex Itching, Swelling and Rash    Reaction to condoms    ROS:  Review of Systems  Constitutional: Negative for chills, fatigue and  fever.  Respiratory: Negative for shortness of breath.   Cardiovascular: Negative for chest pain.  Gastrointestinal: Positive for nausea and vomiting. Negative for constipation and diarrhea.  Genitourinary: Positive for pelvic pain and vaginal bleeding. Negative for difficulty urinating, dysuria, flank pain, vaginal discharge and vaginal pain.  Neurological: Negative for dizziness and headaches.  Psychiatric/Behavioral: Negative.      I have reviewed patient's Past Medical Hx, Surgical Hx, Family Hx, Social Hx, medications and allergies.   Physical Exam   Patient Vitals for the past 24 hrs:  BP Temp Temp src Pulse Resp SpO2 Height Weight  10/21/17 0720 (!) 104/50 - - (!) 125 - 99 % - -  10/21/17 0717 - - - - - 100 % - -  10/21/17 0715 (!) 142/47 - - (!) 119 - 100 % - -  10/21/17 0713 - 97.6 F (36.4 C) Oral 75 18 - - -  10/21/17 0657 - - - - - - 5' 5.5" (1.664 m) 270 lb 0.6 oz (122.5 kg)   Constitutional: Well-developed, well-nourished female in no acute distress.  Cardiovascular: normal rate Respiratory: normal effort GI: Abd soft, non-tender. Pos BS x 4 MS: Extremities nontender, no edema, normal ROM Neurologic: Alert and oriented x 4.  GU: Neg CVAT. Bimanual exam: Cervix closed, no CMT. No uterine enlargement or tenderness. No adnexal masses or tenderness.   LAB RESULTS Results for orders placed or performed during the hospital encounter of 10/21/17 (from the past 24 hour(s))  Urinalysis, Routine w reflex microscopic     Status: Abnormal   Collection Time: 10/21/17  6:52 AM  Result Value Ref Range   Color, Urine YELLOW YELLOW   APPearance HAZY (A) CLEAR   Specific Gravity, Urine 1.019 1.005 - 1.030   pH 6.0 5.0 - 8.0   Glucose, UA NEGATIVE NEGATIVE mg/dL   Hgb urine dipstick LARGE (A) NEGATIVE   Bilirubin Urine NEGATIVE NEGATIVE   Ketones, ur NEGATIVE NEGATIVE mg/dL   Protein, ur NEGATIVE NEGATIVE mg/dL   Nitrite NEGATIVE NEGATIVE   Leukocytes, UA TRACE (A) NEGATIVE    RBC / HPF TOO NUMEROUS TO COUNT 0 - 5 RBC/hpf   WBC, UA 0-5 0 - 5 WBC/hpf   Bacteria, UA NONE SEEN NONE SEEN   Squamous Epithelial / LPF 0-5 (A) NONE SEEN   Mucus PRESENT   Pregnancy, urine POC     Status: None   Collection Time: 10/21/17  7:13 AM  Result Value Ref Range   Preg Test, Ur NEGATIVE NEGATIVE  CBC     Status: Abnormal   Collection Time: 10/21/17  7:42 AM  Result Value Ref Range   WBC 7.8 4.0 - 10.5 K/uL   RBC 5.05 3.87 - 5.11 MIL/uL   Hemoglobin  12.1 12.0 - 15.0 g/dL   HCT 40.9 81.1 - 91.4 %   MCV 75.8 (L) 78.0 - 100.0 fL   MCH 24.0 (L) 26.0 - 34.0 pg   MCHC 31.6 30.0 - 36.0 g/dL   RDW 78.2 95.6 - 21.3 %   Platelets 277 150 - 400 K/uL  Comprehensive metabolic panel     Status: Abnormal   Collection Time: 10/21/17  7:42 AM  Result Value Ref Range   Sodium 137 135 - 145 mmol/L   Potassium 3.9 3.5 - 5.1 mmol/L   Chloride 106 101 - 111 mmol/L   CO2 25 22 - 32 mmol/L   Glucose, Bld 103 (H) 65 - 99 mg/dL   BUN 8 6 - 20 mg/dL   Creatinine, Ser 0.86 0.44 - 1.00 mg/dL   Calcium 8.8 (L) 8.9 - 10.3 mg/dL   Total Protein 7.3 6.5 - 8.1 g/dL   Albumin 3.7 3.5 - 5.0 g/dL   AST 18 15 - 41 U/L   ALT 13 (L) 14 - 54 U/L   Alkaline Phosphatase 88 38 - 126 U/L   Total Bilirubin 0.4 0.3 - 1.2 mg/dL   GFR calc non Af Amer >60 >60 mL/min   GFR calc Af Amer >60 >60 mL/min   Anion gap 6 5 - 15  Lipase, blood     Status: None   Collection Time: 10/21/17  7:42 AM  Result Value Ref Range   Lipase 33 11 - 51 U/L    MDM: UA, UPT, CBC, CMP, lipase Toradol 60 mg IM Pt defers pelvic exam until after pain medication is given  Report to Judeth Horn, NP  Sharen Counter Certified Nurse-Midwife 10/21/2017  7:42 AM    Pt reports some improvement after toradol but still rates pain 10/10 Small amount of dark red blood on exam but otherwise pelvic exam benign C/w Dr. Dion Body. Discharge patient home with #5 percocet. Pt to schedule ED f/u & yearly exam with Dr. Charlotta Newton.  Percocet  2 tabs PO given prior to discharge    A:  1. Dysmenorrhea   2. Pregnancy examination or test, negative result    P: Discharge home Rx percocet #5 & zofran Call office to schedule f/u with Dr. Marlyne Beards, Denny Peon, NP

## 2017-10-21 NOTE — Discharge Instructions (Signed)
Dysmenorrhea °Menstrual cramps (dysmenorrhea) are caused by the muscles of the uterus tightening (contracting) during a menstrual period. For some women, this discomfort is merely bothersome. For others, dysmenorrhea can be severe enough to interfere with everyday activities for a few days each month. °Primary dysmenorrhea is menstrual cramps that last a couple of days when you start having menstrual periods or soon after. This often begins after a teenager starts having her period. As a woman gets older or has a baby, the cramps will usually lessen or disappear. Secondary dysmenorrhea begins later in life, lasts longer, and the pain may be stronger than primary dysmenorrhea. The pain may start before the period and last a few days after the period. °What are the causes? °Dysmenorrhea is usually caused by an underlying problem, such as: °· The tissue lining the uterus grows outside of the uterus in other areas of the body (endometriosis). °· The endometrial tissue, which normally lines the uterus, is found in or grows into the muscular walls of the uterus (adenomyosis). °· The pelvic blood vessels are engorged with blood just before the menstrual period (pelvic congestive syndrome). °· Overgrowth of cells (polyps) in the lining of the uterus or cervix. °· Falling down of the uterus (prolapse) because of loose or stretched ligaments. °· Depression. °· Bladder problems, infection, or inflammation. °· Problems with the intestine, a tumor, or irritable bowel syndrome. °· Cancer of the female organs or bladder. °· A severely tipped uterus. °· A very tight opening or closed cervix. °· Noncancerous tumors of the uterus (fibroids). °· Pelvic inflammatory disease (PID). °· Pelvic scarring (adhesions) from a previous surgery. °· Ovarian cyst. °· An intrauterine device (IUD) used for birth control. °What increases the risk? °You may be at greater risk of dysmenorrhea if: °· You are younger than age 30. °· You started puberty  early. °· You have irregular or heavy bleeding. °· You have never given birth. °· You have a family history of this problem. °· You are a smoker. °What are the signs or symptoms? °· Cramping or throbbing pain in your lower abdomen. °· Headaches. °· Lower back pain. °· Nausea or vomiting. °· Diarrhea. °· Sweating or dizziness. °· Loose stools. °How is this diagnosed? °A diagnosis is based on your history, symptoms, physical exam, diagnostic tests, or procedures. Diagnostic tests or procedures may include: °· Blood tests. °· Ultrasonography. °· An examination of the lining of the uterus (dilation and curettage, D&C). °· An examination inside your abdomen or pelvis with a scope (laparoscopy). °· X-rays. °· CT scan. °· MRI. °· An examination inside the bladder with a scope (cystoscopy). °· An examination inside the intestine or stomach with a scope (colonoscopy, gastroscopy). °How is this treated? °Treatment depends on the cause of the dysmenorrhea. Treatment may include: °· Pain medicine prescribed by your health care provider. °· Birth control pills or an IUD with progesterone hormone in it. °· Hormone replacement therapy. °· Nonsteroidal anti-inflammatory drugs (NSAIDs). These may help stop the production of prostaglandins. °· Surgery to remove adhesions, endometriosis, ovarian cyst, or fibroids. °· Removal of the uterus (hysterectomy). °· Progesterone shots to stop the menstrual period. °· Cutting the nerves on the sacrum that go to the female organs (presacral neurectomy). °· Electric current to the sacral nerves (sacral nerve stimulation). °· Antidepressant medicine. °· Psychiatric therapy, counseling, or group therapy. °· Exercise and physical therapy. °· Meditation and yoga therapy. °· Acupuncture. °Follow these instructions at home: °· Only take over-the-counter or prescription medicines as directed   by your health care provider. °· Place a heating pad or hot water bottle on your lower back or abdomen. Do not  sleep with the heating pad. °· Use aerobic exercises, walking, swimming, biking, and other exercises to help lessen the cramping. °· Massage to the lower back or abdomen may help. °· Stop smoking. °· Avoid alcohol and caffeine. °Contact a health care provider if: °· Your pain does not get better with medicine. °· You have pain with sexual intercourse. °· Your pain increases and is not controlled with medicines. °· You have abnormal vaginal bleeding with your period. °· You develop nausea or vomiting with your period that is not controlled with medicine. °Get help right away if: °You pass out. °This information is not intended to replace advice given to you by your health care provider. Make sure you discuss any questions you have with your health care provider. °Document Released: 09/03/2005 Document Revised: 02/09/2016 Document Reviewed: 02/19/2013 °Elsevier Interactive Patient Education © 2017 [employer] ° °

## 2017-10-21 NOTE — MAU Note (Signed)
Lower abdominal pain started last night and worsened overnight.  Patient vomited 5+times overnight and having soft stools.  No fever.  Has had this pain from endometriosis per pt.  Menstrual started yesterday.

## 2018-01-06 ENCOUNTER — Encounter (HOSPITAL_COMMUNITY): Payer: Self-pay | Admitting: Emergency Medicine

## 2018-01-06 ENCOUNTER — Other Ambulatory Visit: Payer: Self-pay

## 2018-01-06 ENCOUNTER — Emergency Department (HOSPITAL_COMMUNITY): Payer: Self-pay

## 2018-01-06 ENCOUNTER — Emergency Department (HOSPITAL_COMMUNITY)
Admission: EM | Admit: 2018-01-06 | Discharge: 2018-01-07 | Disposition: A | Discharge status: Home / Self Care | Payer: Self-pay | Attending: Emergency Medicine | Admitting: Emergency Medicine

## 2018-01-06 DIAGNOSIS — R569 Unspecified convulsions: Secondary | ICD-10-CM | POA: Insufficient documentation

## 2018-01-06 DIAGNOSIS — Y929 Unspecified place or not applicable: Secondary | ICD-10-CM | POA: Insufficient documentation

## 2018-01-06 DIAGNOSIS — Z5321 Procedure and treatment not carried out due to patient leaving prior to being seen by health care provider: Secondary | ICD-10-CM | POA: Insufficient documentation

## 2018-01-06 DIAGNOSIS — W208XXA Other cause of strike by thrown, projected or falling object, initial encounter: Secondary | ICD-10-CM | POA: Insufficient documentation

## 2018-01-06 DIAGNOSIS — Y998 Other external cause status: Secondary | ICD-10-CM | POA: Insufficient documentation

## 2018-01-06 DIAGNOSIS — Y939 Activity, unspecified: Secondary | ICD-10-CM | POA: Insufficient documentation

## 2018-01-06 DIAGNOSIS — S3992XA Unspecified injury of lower back, initial encounter: Secondary | ICD-10-CM | POA: Insufficient documentation

## 2018-01-06 LAB — BASIC METABOLIC PANEL
Anion gap: 11 (ref 5–15)
BUN: 7 mg/dL (ref 6–20)
CO2: 24 mmol/L (ref 22–32)
CREATININE: 0.61 mg/dL (ref 0.44–1.00)
Calcium: 9.4 mg/dL (ref 8.9–10.3)
Chloride: 102 mmol/L (ref 101–111)
GFR calc Af Amer: >60 mL/min (ref >60)
Glucose, Bld: 83 mg/dL (ref 65–99)
Potassium: 3.8 mmol/L (ref 3.5–5.1)
SODIUM: 137 mmol/L (ref 135–145)

## 2018-01-06 LAB — I-STAT BETA HCG BLOOD, ED (MC, WL, AP ONLY)

## 2018-01-06 LAB — I-STAT TROPONIN, ED: TROPONIN I, POC: 0 ng/mL (ref 0.00–0.08)

## 2018-01-06 NOTE — ED Triage Notes (Signed)
Pt reports she had about 200 lb fall on to her back on Tuesday, despite following provider recommendations she continues to get worse.  Pt states she also had a seizure before coming to the hospital, she has been referred to neurologist and has been diagnosed w/ pseudoseizures recently.  Pt is alert/oriented in triage.

## 2018-01-06 NOTE — ED Notes (Signed)
Pt states she feels like she is having a seizure, no seizure activity noted.  Spoke to [employer] RN who stated pt could go out to the waiting room and should be placed close to [facility] RN.  [facility] notified of situation.

## 2018-01-07 ENCOUNTER — Emergency Department (HOSPITAL_COMMUNITY): Payer: Self-pay

## 2018-01-07 ENCOUNTER — Emergency Department (HOSPITAL_COMMUNITY)
Admission: EM | Admit: 2018-01-07 | Discharge: 2018-01-07 | Disposition: A | Discharge status: Home / Self Care | Payer: Self-pay | Attending: Emergency Medicine | Admitting: Emergency Medicine

## 2018-01-07 DIAGNOSIS — E119 Type 2 diabetes mellitus without complications: Secondary | ICD-10-CM | POA: Insufficient documentation

## 2018-01-07 DIAGNOSIS — Z79899 Other long term (current) drug therapy: Secondary | ICD-10-CM | POA: Insufficient documentation

## 2018-01-07 DIAGNOSIS — F17290 Nicotine dependence, other tobacco product, uncomplicated: Secondary | ICD-10-CM | POA: Insufficient documentation

## 2018-01-07 DIAGNOSIS — M5441 Lumbago with sciatica, right side: Secondary | ICD-10-CM | POA: Insufficient documentation

## 2018-01-07 DIAGNOSIS — Z91040 Latex allergy status: Secondary | ICD-10-CM | POA: Insufficient documentation

## 2018-01-07 LAB — COMPREHENSIVE METABOLIC PANEL
ALK PHOS: 79 U/L (ref 38–126)
ALT: 14 U/L (ref 14–54)
AST: 28 U/L (ref 15–41)
Albumin: 3.7 g/dL (ref 3.5–5.0)
Anion gap: 9 (ref 5–15)
BUN: 12 mg/dL (ref 6–20)
CALCIUM: 9.1 mg/dL (ref 8.9–10.3)
CHLORIDE: 104 mmol/L (ref 101–111)
CO2: 25 mmol/L (ref 22–32)
CREATININE: 0.84 mg/dL (ref 0.44–1.00)
GFR calc Af Amer: >60 mL/min (ref >60)
Glucose, Bld: 93 mg/dL (ref 65–99)
Potassium: 5 mmol/L (ref 3.5–5.1)
Sodium: 138 mmol/L (ref 135–145)
Total Bilirubin: 0.9 mg/dL (ref 0.3–1.2)
Total Protein: 7.1 g/dL (ref 6.5–8.1)

## 2018-01-07 LAB — CBC
HCT: 40.1 % (ref 36.0–46.0)
Hemoglobin: 12.7 g/dL (ref 12.0–15.0)
MCH: 24 pg — ABNORMAL LOW (ref 26.0–34.0)
MCHC: 31.7 g/dL (ref 30.0–36.0)
MCV: 75.8 fL — AB (ref 78.0–100.0)
PLATELETS: 299 10*3/uL (ref 150–400)
RBC: 5.29 MIL/uL — AB (ref 3.87–5.11)
RDW: 14.5 % (ref 11.5–15.5)
WBC: 10 10*3/uL (ref 4.0–10.5)

## 2018-01-07 LAB — CBC WITH DIFFERENTIAL/PLATELET
Basophils Absolute: 0 10*3/uL (ref 0.0–0.1)
Basophils Relative: 0 %
EOS PCT: 1 %
Eosinophils Absolute: 0.1 10*3/uL (ref 0.0–0.7)
HEMATOCRIT: 41.9 % (ref 36.0–46.0)
HEMOGLOBIN: 13.3 g/dL (ref 12.0–15.0)
LYMPHS ABS: 2.3 10*3/uL (ref 0.7–4.0)
LYMPHS PCT: 24 %
MCH: 24.3 pg — AB (ref 26.0–34.0)
MCHC: 31.7 g/dL (ref 30.0–36.0)
MCV: 76.6 fL — ABNORMAL LOW (ref 78.0–100.0)
Monocytes Absolute: 1 10*3/uL (ref 0.1–1.0)
Monocytes Relative: 11 %
NEUTROS ABS: 6.3 10*3/uL (ref 1.7–7.7)
NEUTROS PCT: 64 %
PLATELETS: 289 10*3/uL (ref 150–400)
RBC: 5.47 MIL/uL — AB (ref 3.87–5.11)
RDW: 14.8 % (ref 11.5–15.5)
WBC: 9.7 10*3/uL (ref 4.0–10.5)

## 2018-01-07 LAB — URINALYSIS, ROUTINE W REFLEX MICROSCOPIC
BILIRUBIN URINE: NEGATIVE
Glucose, UA: NEGATIVE mg/dL
HGB URINE DIPSTICK: NEGATIVE
KETONES UR: NEGATIVE mg/dL
Leukocytes, UA: NEGATIVE
Nitrite: NEGATIVE
PH: 7 (ref 5.0–8.0)
Protein, ur: NEGATIVE mg/dL
SPECIFIC GRAVITY, URINE: 1.021 (ref 1.005–1.030)

## 2018-01-07 LAB — POC URINE PREG, ED: Preg Test, Ur: NEGATIVE

## 2018-01-07 LAB — I-STAT BETA HCG BLOOD, ED (MC, WL, AP ONLY): I-stat hCG, quantitative: <5 m[IU]/mL (ref <5)

## 2018-01-07 MED ORDER — HYDROCODONE-ACETAMINOPHEN 5-325 MG PO TABS: 1.0000 | ORAL_TABLET | ORAL | 0 refills | Status: DC | PRN

## 2018-01-07 MED ORDER — DIAZEPAM 5 MG PO TABS
5.0000 mg | ORAL_TABLET | Freq: Once | ORAL | Status: AC
Start: 2018-01-07 — End: 2018-01-07
  Administered 2018-01-07: 5 mg via ORAL
  Filled 2018-01-07: qty 1

## 2018-01-07 MED ORDER — METHOCARBAMOL 750 MG PO TABS: 750.0000 mg | ORAL_TABLET | Freq: Three times a day (TID) | ORAL | 0 refills | Status: DC | PRN

## 2018-01-07 MED ORDER — ACETAMINOPHEN 500 MG PO TABS
1000.0000 mg | ORAL_TABLET | Freq: Once | ORAL | Status: AC
  Administered 2018-01-07: 1000 mg via ORAL
  Filled 2018-01-07: qty 2

## 2018-01-07 MED ORDER — MORPHINE SULFATE (PF) 4 MG/ML IV SOLN
4.0000 mg | Freq: Once | INTRAVENOUS | Status: AC
  Administered 2018-01-07: 4 mg via INTRAVENOUS
  Filled 2018-01-07: qty 1

## 2018-01-07 NOTE — ED Notes (Signed)
Patient transported to X-ray 

## 2018-01-07 NOTE — ED Notes (Signed)
Pt ambulatory to restroom at this time. 

## 2018-01-07 NOTE — ED Notes (Signed)
Pt ambulated to restroom with no assistance. ?

## 2018-01-07 NOTE — ED Provider Notes (Signed)
[facility]-EMERGENCY DEPT Provider Note   CSN: [id number] Arrival date & time: 01/07/18  4098     History   Chief Complaint Chief Complaint  Patient presents with  . Back Pain    HPI Ashlee Thompson is a 22 y.o. female with a history of pseudoseizures, DM, ovarian cysts, anxiety, who presents today for evaluation of lower back pain and right leg weakness.  She reports that her right leg gave out on her this morning.  She reports that she does not have any feeling in her right leg from hip to foot.  She reports inability to move the leg.  She has seen her PCP for this multiple times and told that she has a pinched nerve treated with ibuprofen.  She reports that her initial injury was approximately 2 weeks ago when she had a 75 pound pole fall on her lower back and then a dress manikin fall on her.  She reports that her pain has just been gradually worsening since then.  The weakness only started this morning.  She denies any difficulties urinating, no changes to bowel or bladder function.  She does report tingling and decreased sensation on her right medial thigh.  HPI  Past Medical History:  Diagnosis Date  . Anxiety   . Bilateral ovarian cysts   . Diabetes mellitus without complication (HCC)    prediabetic  . GI bleed due to NSAIDs   . Myocardial infarction (HCC)   . Pseudoseizures   . Seizures (HCC)    pseudoseizures    Patient Active Problem List   Diagnosis Date Noted  . Headache   . UGI bleed 07/07/2016  . History of diabetes mellitus 07/07/2016  . Morbid obesity (HCC) 07/07/2016  . Hematemesis with nausea 07/07/2016  . GI bleed 07/07/2016    Past Surgical History:  Procedure Laterality Date  . ESOPHAGOGASTRODUODENOSCOPY N/A 07/07/2016   Procedure: ESOPHAGOGASTRODUODENOSCOPY (EGD);  Surgeon: Ruffin Frederick, MD;  Location: [facility] ENDOSCOPY;  Service: Gastroenterology;  Laterality: N/A;  . HEMORROIDECTOMY       OB History    Gravida  1     Para  0   Term  0   Preterm  0   AB  1   Living  0     SAB  1   TAB  0   Ectopic  0   Multiple  0   Live Births  0            Home Medications    Prior to Admission medications   Medication Sig Start Date End Date Taking? Authorizing Provider  diphenhydrAMINE (BENADRYL) 12.5 MG/5ML liquid Take 12.5 mg 4 (four) times daily as needed by mouth for allergies.   Yes [provider]  etonogestrel (NEXPLANON) 68 MG IMPL implant 1 each by Subdermal route continuous.   Yes [provider]  FLUoxetine (PROZAC) 10 MG capsule Take 10 mg by mouth daily.   Yes [provider]  hydrocortisone (PROCTOSOL HC) 2.5 % rectal cream Place 1 application rectally 2 (two) times daily as needed for hemorrhoids or anal itching.   Yes [provider]  omeprazole (PRILOSEC) 40 MG capsule Take 40 mg by mouth 2 (two) times daily as needed (for reflux).  06/08/17  Yes [provider]  polyethylene glycol (MIRALAX / GLYCOLAX) packet Take 17 g by mouth daily as needed for mild constipation.   Yes [provider]  HYDROcodone-acetaminophen (NORCO/VICODIN) 5-325 MG tablet Take 1 tablet by mouth  every 4 (four) hours as needed. 01/07/18   Cristina GongHammond, Grasiela Jonsson W, PA-C  ibuprofen (ADVIL,MOTRIN) 800 MG tablet Take 1 tablet (800 mg total) by mouth 3 (three) times daily. Patient not taking: Reported on 01/07/2018 09/17/17   Fayrene Helperran, Bowie, PA-C  LORazepam (ATIVAN) 0.5 MG tablet Take 1 tablet (0.5 mg total) every 8 (eight) hours as needed by mouth for anxiety. Patient not taking: Reported on 01/07/2018 08/03/17   Gerhard MunchLockwood, Robert, MD  methocarbamol (ROBAXIN) 750 MG tablet Take 1-2 tablets (750-1,500 mg total) by mouth 3 (three) times daily as needed for muscle spasms. 01/07/18   Cristina GongHammond, Feliz Lincoln W, PA-C  ondansetron (ZOFRAN ODT) 4 MG disintegrating tablet Take 1 tablet (4 mg total) by mouth every 8 (eight) hours as needed for nausea or vomiting. Patient not taking:  Reported on 01/07/2018 10/21/17   Judeth HornLawrence, Erin, NP  oxyCODONE-acetaminophen (PERCOCET/ROXICET) 5-325 MG tablet Take 1 tablet by mouth every 4 (four) hours as needed. Patient not taking: Reported on 01/07/2018 10/21/17   Judeth HornLawrence, Erin, NP    Family History Family History  Problem Relation Age of Onset  . Hyperlipidemia Mother   . Mental illness Mother   . Hyperlipidemia Father   . Mental illness Brother   . Heart disease Paternal Grandfather   . Hyperlipidemia Paternal Grandfather   . Hypertension Paternal Grandfather   . CAD Other   . Cancer Other     Social History Social History   Tobacco Use  . Smoking status: Current Some Day Smoker    Packs/day: 0.25    Types: Cigars  . Smokeless tobacco: Never Used  Substance Use Topics  . Alcohol use: Yes    Comment: social  . Drug use: No     Allergies   Bentyl [dicyclomine hcl]; Haldol [haloperidol lactate]; Tramadol; and Latex   Review of Systems Review of Systems  Constitutional: Negative for chills, fever and unexpected weight change.  Respiratory: Negative for shortness of breath and wheezing.   Gastrointestinal: Negative for diarrhea, nausea and vomiting.  Genitourinary: Negative for difficulty urinating, dysuria and urgency.  Musculoskeletal: Positive for back pain and gait problem. Negative for myalgias, neck pain and neck stiffness.  Neurological: Positive for weakness and numbness. Negative for dizziness, syncope and headaches.  Psychiatric/Behavioral: Negative for confusion.  All other systems reviewed and are negative.    Physical Exam Updated Vital Signs BP 117/81   Pulse 89   Temp 97.8 F (36.6 C) (Oral)   Resp 18   SpO2 100%   Physical Exam  Constitutional: She appears well-developed and well-nourished. No distress.  HENT:  Head: Normocephalic and atraumatic.  Eyes: Conjunctivae are normal. Right eye exhibits no discharge. Left eye exhibits no discharge. No scleral icterus.  Neck: Normal range of  motion.  Cardiovascular: Normal rate, regular rhythm, normal heart sounds and intact distal pulses.  No murmur heard. Pulmonary/Chest: Effort normal. No stridor. No respiratory distress.  Abdominal: She exhibits no distension.  Musculoskeletal: She exhibits no edema or deformity.  Diffuse TTP across lower back.  Due to patient body habitus unable to fully assess for step offs or deformities. Patient has 4/5 strength in left leg, 3/5 strength in right leg with hip flexion/extension and dorsi/plantar flexion.  Patient right hip is painful with general ROM, worse with hip external rotation.    Neurological: She is alert. She exhibits normal muscle tone.  Patient is alert and oriented.  She has decreased sensation in her entire right leg to pain/touch except for her entire foot has  normal sensation.  She has 3/5 strength in right leg.   Unable to accurately assess patellar reflexes due to patient pain, and body habitus.  At times when testing left leg strength patient appears to push right foot down on bed.   Babinski's are downgoing bilaterally.  Skin: Skin is warm and dry. She is not diaphoretic.  Psychiatric: She has a normal mood and affect. Her behavior is normal.  Nursing note and vitals reviewed.    ED Treatments / Results  Labs (all labs ordered are listed, but only abnormal results are displayed) Labs Reviewed  CBC WITH DIFFERENTIAL/PLATELET - Abnormal; Notable for the following components:      Result Value   RBC 5.47 (*)    MCV 76.6 (*)    MCH 24.3 (*)    All other components within normal limits  URINALYSIS, ROUTINE W REFLEX MICROSCOPIC - Abnormal; Notable for the following components:   APPearance HAZY (*)    All other components within normal limits  COMPREHENSIVE METABOLIC PANEL  POC URINE PREG, ED  I-STAT BETA HCG BLOOD, ED ([facility], [facility], [facility] ONLY)    EKG None  Radiology   Dg Lumbar Spine Complete  Result Date: 01/07/2018 CLINICAL DATA:  Low back pain radiating into  the right hip and leg. EXAM: LUMBAR SPINE - COMPLETE 4+ VIEW COMPARISON:  CT abdomen pelvis dated September 17, 2017. FINDINGS: Five lumbar type vertebral bodies. No acute fracture or subluxation. Vertebral body heights are preserved. Alignment is normal. Mild disc height loss at L4-L5, unchanged. Remaining intervertebral disc heights are preserved. The sacroiliac joints are intact. IMPRESSION: 1. Mild degenerative disc disease at L4-L5, similar to prior study. Electronically Signed   By: Obie Dredge M.D.   On: 01/07/2018 12:11   Mr Lumbar Spine Wo Contrast  Result Date: 01/07/2018 CLINICAL DATA:  22 y/o F; worsening back pain exacerbated by object falling on her 2 weeks ago. EXAM: MRI LUMBAR SPINE WITHOUT CONTRAST TECHNIQUE: Multiplanar, multisequence MR imaging of the lumbar spine was performed. No intravenous contrast was administered. COMPARISON:  01/07/2018 lumbar spine radiographs. 09/17/2017 CT abdomen and pelvis. FINDINGS: Segmentation:  Standard. Alignment:  Physiologic. Vertebrae:  No fracture, evidence of discitis, or bone lesion. Conus medullaris and cauda equina: Conus extends to the L1-2 level. Conus and cauda equina appear normal. Paraspinal and other soft tissues: Negative. Disc levels: L1-2: No significant disc displacement, foraminal stenosis, or canal stenosis. L2-3: No significant disc displacement, foraminal stenosis, or canal stenosis. L3-4: No significant disc displacement, foraminal stenosis, or canal stenosis. L4-5: No significant disc displacement, foraminal stenosis, or canal stenosis. L5-S1: Minimal right central and subarticular disc protrusion with annular fissure. No significant foraminal or canal stenosis. IMPRESSION: 1. No abnormal bone marrow signal or malalignment. No evidence for fracture. 2. Minimal L5-S1 right central and subarticular protrusion with annular fissure. No significant foraminal or canal stenosis. Electronically Signed   By: Mitzi Hansen M.D.   On:  01/07/2018 14:34   Dg Hip Unilat With Pelvis 2-3 Views Right  Result Date: 01/07/2018 CLINICAL DATA:  Back pain radiating into the right hip and leg. EXAM: DG HIP (WITH OR WITHOUT PELVIS) 2-3V RIGHT COMPARISON:  CT abdomen pelvis dated September 17, 2017. FINDINGS: There is no evidence of hip fracture or dislocation. There is no evidence of arthropathy or other focal bone abnormality. IMPRESSION: Negative. Electronically Signed   By: Obie Dredge M.D.   On: 01/07/2018 12:12    Procedures Procedures (including critical care time)  Medications Ordered in  ED Medications  diazepam (VALIUM) tablet 5 mg (5 mg Oral Given 01/07/18 1131)  acetaminophen (TYLENOL) tablet 1,000 mg (1,000 mg Oral Given 01/07/18 1131)  morphine 4 MG/ML injection 4 mg (4 mg Intravenous Given 01/07/18 1352)     Initial Impression / Assessment and Plan / ED Course  I have reviewed the triage vital signs and the nursing notes.  Pertinent labs & imaging results that were available during my care of the patient were reviewed by me and considered in my medical decision making (see chart for details).  Clinical Course as of Jan 08 1628  Tue Jan 07, 2018  1346 Notified that patient needs pain meds in MRI or will not tolerate scan, will give IV morphine.    [EH]  1440 Patient re-evaluated, now has 4/5 strength in her bilateral lower extremities.  She has been treated with Valium for muscle spasms and IV morphine for pain.  She was also given Tylenol for pain.  She has been seen ambulating to the restroom without difficulties by multiple staff members.     [EH]    Clinical Course User Index [EH] Cristina Gong, PA-C   Patient presents today for evaluation of acute bilateral lower back pain with right leg decreased sensation and weakness.  On initial exam she had only 3/5 strength in right lower extremity.  She had subjective decreased sensations in the right leg.  Initially she told me that it was her entire right leg  except for the foot, however patient later told me that it was only the area between her foot and her knee.  Patient was seen ambulating to the bathroom by staff multiple times without obvious difficulty, she did not require assistance.  Based on back pain with neurologic deficit plain films of lumbar spine along with right hip were performed.  X-rays did not show significant abnormalities or cause for her pain.  MRI lumbar spine was obtained.  Patient's pain and symptoms were treated with Tylenol, Valium, and morphine, after which she had improved strength and sensation.  MRI did not reveal any spinal cord compression minimal L5-S1 right-sided subarticular disc protrusion.  Patient's symptoms do not appear to match this minimal disc protrusion.    Patient does not have evidence of spinal cord compression, no evidence of cauda equina on MRI.  Patient ambulatory without difficulties.  She will be given prescriptions for pain medicine, muscle relaxers, instructions on alternating ibuprofen and Tylenol, and neurology follow-up.  Patient was given strict return precautions and states her understanding.  This patient was seen as a shared visit with my supervising physician Dr. Freida Busman who evaluated the patient and agreed with my plan.  Final Clinical Impressions(s) / ED Diagnoses   Final diagnoses:  Acute bilateral low back pain with right-sided sciatica    ED Discharge Orders        Ordered    HYDROcodone-acetaminophen (NORCO/VICODIN) 5-325 MG tablet  Every 4 hours PRN     01/07/18 1453    methocarbamol (ROBAXIN) 750 MG tablet  3 times daily PRN     01/07/18 1453       Cristina Gong, [place] 01/07/18 1636

## 2018-01-07 NOTE — ED Triage Notes (Signed)
Transported by [facility] from home--persistent back pain that has gotten worse over the last 2 weeks after a object fell on her 2 weeks ago. Saw [facility] and was told that she has a pinched nerve. VSS with EMS.

## 2018-01-07 NOTE — ED Notes (Signed)
Called for repeat vitals multiple times with no answer

## 2018-01-07 NOTE — Discharge Instructions (Signed)
Please take Ibuprofen (Advil, motrin) and Tylenol (acetaminophen) to relieve your pain.  You may take up to 600 MG (3 pills) of normal strength ibuprofen every 8 hours as needed.  In between doses of ibuprofen you make take tylenol, up to 1,000 mg (two extra strength pills).  Do not take more than 3,000 mg tylenol in a 24 hour period.  Please check all medication labels as many medications such as pain and cold medications may contain tylenol.  Do not drink alcohol while taking these medications.  Do not take other NSAID'S while taking ibuprofen (such as aleve or naproxen).  Please take ibuprofen with food to decrease stomach upset. ° °Today you received medications that may make you sleepy or impair your ability to make decisions.  For the next 24 hours please do not drive, operate heavy machinery, care for a small child with out another adult present, or perform any activities that may cause harm to you or someone else if you were to fall asleep or be impaired.  ° °You are being prescribed a medication which may make you sleepy. Please follow up of listed precautions for at least 24 hours after taking one dose. ° °

## 2018-01-07 NOTE — ED Notes (Signed)
Patient in MRI 

## 2018-01-07 NOTE — ED Provider Notes (Signed)
Medical screening examination/treatment/procedure(s) were conducted as a shared visit with non-physician practitioner(s) and myself.  I personally evaluated the patient during the encounter.  None 22 year old female presents with persistent back pain radiating down her right leg after a large object fell on her 2 weeks ago.  On exam here patient able to lift up both legs.  She has 5/5 strength on both lower extremities.  Trace patellar reflexes and symmetric.  She had MRI of her L-spine as well as plain issues of her hip which did not show any acute process.  Patient noted to be ambulatory here and had good resolution of her symptoms with muscle relaxants and analgesics.  She is stable for discharge   Lorre NickAllen, Jashawna Reever, MD 01/07/18 1450

## 2018-01-07 NOTE — ED Notes (Signed)
ED Provider at bedside. 

## 2018-01-16 ENCOUNTER — Ambulatory Visit: Payer: Self-pay | Admitting: Physical Therapy

## 2018-01-17 ENCOUNTER — Inpatient Hospital Stay (HOSPITAL_COMMUNITY): Payer: PRIVATE HEALTH INSURANCE

## 2018-01-17 ENCOUNTER — Other Ambulatory Visit: Payer: Self-pay

## 2018-01-17 ENCOUNTER — Encounter (HOSPITAL_COMMUNITY): Payer: Self-pay

## 2018-01-17 ENCOUNTER — Inpatient Hospital Stay (HOSPITAL_COMMUNITY)
Admission: AD | Admit: 2018-01-17 | Discharge: 2018-01-17 | Disposition: A | Discharge status: Home / Self Care | Payer: PRIVATE HEALTH INSURANCE | Source: Ambulatory Visit | Attending: Obstetrics & Gynecology | Admitting: Obstetrics & Gynecology

## 2018-01-17 DIAGNOSIS — Z888 Allergy status to other drugs, medicaments and biological substances status: Secondary | ICD-10-CM | POA: Insufficient documentation

## 2018-01-17 DIAGNOSIS — R112 Nausea with vomiting, unspecified: Secondary | ICD-10-CM | POA: Insufficient documentation

## 2018-01-17 DIAGNOSIS — F17210 Nicotine dependence, cigarettes, uncomplicated: Secondary | ICD-10-CM | POA: Insufficient documentation

## 2018-01-17 DIAGNOSIS — N946 Dysmenorrhea, unspecified: Secondary | ICD-10-CM | POA: Diagnosis not present

## 2018-01-17 DIAGNOSIS — R102 Pelvic and perineal pain: Secondary | ICD-10-CM | POA: Insufficient documentation

## 2018-01-17 DIAGNOSIS — I252 Old myocardial infarction: Secondary | ICD-10-CM | POA: Diagnosis not present

## 2018-01-17 DIAGNOSIS — N83202 Unspecified ovarian cyst, left side: Secondary | ICD-10-CM | POA: Diagnosis not present

## 2018-01-17 DIAGNOSIS — Z79899 Other long term (current) drug therapy: Secondary | ICD-10-CM | POA: Diagnosis not present

## 2018-01-17 DIAGNOSIS — N83201 Unspecified ovarian cyst, right side: Secondary | ICD-10-CM | POA: Insufficient documentation

## 2018-01-17 DIAGNOSIS — E119 Type 2 diabetes mellitus without complications: Secondary | ICD-10-CM | POA: Diagnosis not present

## 2018-01-17 DIAGNOSIS — R1031 Right lower quadrant pain: Secondary | ICD-10-CM | POA: Insufficient documentation

## 2018-01-17 DIAGNOSIS — K589 Irritable bowel syndrome without diarrhea: Secondary | ICD-10-CM | POA: Insufficient documentation

## 2018-01-17 DIAGNOSIS — F419 Anxiety disorder, unspecified: Secondary | ICD-10-CM | POA: Diagnosis not present

## 2018-01-17 DIAGNOSIS — Z8742 Personal history of other diseases of the female genital tract: Secondary | ICD-10-CM

## 2018-01-17 LAB — COMPREHENSIVE METABOLIC PANEL
ALT: 12 U/L — AB (ref 14–54)
ANION GAP: 7 (ref 5–15)
AST: 18 U/L (ref 15–41)
Albumin: 3.8 g/dL (ref 3.5–5.0)
Alkaline Phosphatase: 85 U/L (ref 38–126)
BUN: 7 mg/dL (ref 6–20)
CHLORIDE: 109 mmol/L (ref 101–111)
CO2: 24 mmol/L (ref 22–32)
Calcium: 9.4 mg/dL (ref 8.9–10.3)
Creatinine, Ser: 0.61 mg/dL (ref 0.44–1.00)
Glucose, Bld: 95 mg/dL (ref 65–99)
Potassium: 4.3 mmol/L (ref 3.5–5.1)
SODIUM: 140 mmol/L (ref 135–145)
Total Bilirubin: 0.9 mg/dL (ref 0.3–1.2)
Total Protein: 7.2 g/dL (ref 6.5–8.1)

## 2018-01-17 LAB — URINALYSIS, MICROSCOPIC (REFLEX)

## 2018-01-17 LAB — CBC
HCT: 39.3 % (ref 36.0–46.0)
HEMOGLOBIN: 12.6 g/dL (ref 12.0–15.0)
MCH: 24.3 pg — AB (ref 26.0–34.0)
MCHC: 32.1 g/dL (ref 30.0–36.0)
MCV: 75.7 fL — AB (ref 78.0–100.0)
PLATELETS: 278 10*3/uL (ref 150–400)
RBC: 5.19 MIL/uL — ABNORMAL HIGH (ref 3.87–5.11)
RDW: 14.8 % (ref 11.5–15.5)
WBC: 8.3 10*3/uL (ref 4.0–10.5)

## 2018-01-17 LAB — POCT PREGNANCY, URINE: PREG TEST UR: NEGATIVE

## 2018-01-17 LAB — URINALYSIS, ROUTINE W REFLEX MICROSCOPIC

## 2018-01-17 MED ORDER — HYDROMORPHONE HCL 1 MG/ML IJ SOLN
1.0000 mg | Freq: Once | INTRAMUSCULAR | Status: AC
  Administered 2018-01-17: 1 mg via INTRAVENOUS
  Filled 2018-01-17: qty 1

## 2018-01-17 MED ORDER — NAPROXEN 500 MG PO TABS: 500.0000 mg | ORAL_TABLET | Freq: Two times a day (BID) | ORAL | 3 refills | Status: DC

## 2018-01-17 MED ORDER — LACTATED RINGERS IV SOLN
INTRAVENOUS | Status: DC
  Administered 2018-01-17: 09:00:00 via INTRAVENOUS

## 2018-01-17 MED ORDER — KETOROLAC TROMETHAMINE 60 MG/2ML IM SOLN
60.0000 mg | Freq: Once | INTRAMUSCULAR | Status: AC
  Administered 2018-01-17: 60 mg via INTRAMUSCULAR
  Filled 2018-01-17: qty 2

## 2018-01-17 NOTE — Discharge Instructions (Signed)
[facility] [employer] for [employer] at [facility]       Phone: [phone]  Center for [employer] at [place] Phone: [phone]  [facility] for [employer] at [place]  Phone: [phone]  [facility] for [employer] at [employer]  Phone: [phone]  Center for [facility] at [place]  Phone: [phone]  [facility]       Phone: [phone]  [facility]    Phone: [phone]   [facility])    Phone: [phone]  [facility]    Phone: [phone]  [facility]                                     Phone: [phone]  [facility]    Phone: [phone]  [facility]    Phone: [phone]  [facility]       Phone: [phone]   [facility]  Phone: [phone]  [facility]    Phone: [phone]  [facility] [place]   Phone: [phone]  [facility]      Phone: [phone]  [facility]    Phone: [phone]      Dysmenorrhea Menstrual cramps (dysmenorrhea) are caused by the muscles of the uterus tightening (contracting) during a menstrual period. For some women, this discomfort is merely bothersome. For others, dysmenorrhea can be severe enough to interfere with everyday activities for a few days each month. Primary dysmenorrhea is menstrual cramps that last a couple of days when you start having menstrual periods or soon after. This often begins after a teenager starts having her period. As a woman gets older or has a baby, the cramps will usually lessen or disappear. Secondary dysmenorrhea begins later in life, lasts longer, and the pain may be stronger than primary dysmenorrhea. The pain may start before the period  and last a few days after the period. What are the causes? Dysmenorrhea is usually caused by an underlying problem, such as:  The tissue lining the uterus grows outside of the uterus in other areas of the body (endometriosis).  The endometrial tissue, which normally lines the uterus, is found in or grows into the muscular walls of the uterus (adenomyosis).  The pelvic blood vessels are engorged with blood just before the menstrual period (pelvic congestive syndrome).  Overgrowth of cells (polyps) in the lining of the uterus or cervix.  Falling down of the uterus (prolapse) because of loose or stretched ligaments.  Depression.  Bladder problems, infection, or inflammation.  Problems with the intestine, a tumor, or irritable bowel syndrome.  Cancer of the female organs or bladder.  A severely tipped uterus.  A very tight opening or closed cervix.  Noncancerous tumors of the uterus (fibroids).  Pelvic inflammatory disease (PID).  Pelvic scarring (adhesions) from a previous surgery.  Ovarian cyst.  An intrauterine device (IUD) used for birth control.  What increases the risk? You may be at greater risk of dysmenorrhea if:  You are younger than age 61.  You started puberty early.  You have irregular or heavy bleeding.  You have never given birth.  You have a family history of this problem.  You are a smoker.  What are the signs or symptoms?  Cramping or throbbing pain in your lower abdomen.  Headaches.  Lower back pain.  Nausea or vomiting.  Diarrhea.  Sweating or dizziness.  Loose stools. How is this diagnosed? A diagnosis is based on your history, symptoms, physical exam, diagnostic tests, or procedures. Diagnostic tests or procedures may include:  Blood tests.  Ultrasonography.  An examination of the lining of the uterus (dilation and curettage, D&C).  An examination inside your abdomen or pelvis with a scope (laparoscopy).  X-rays.  CT  scan.  MRI.  An examination inside the bladder with a scope (cystoscopy).  An examination inside the intestine or stomach with a scope (colonoscopy, gastroscopy).  How is this treated? Treatment depends on the cause of the dysmenorrhea. Treatment may include:  Pain medicine prescribed by your health care provider.  Birth control pills or an IUD with progesterone hormone in it.  Hormone replacement therapy.  Nonsteroidal anti-inflammatory drugs (NSAIDs). These may help stop the production of prostaglandins.  Surgery to remove adhesions, endometriosis, ovarian cyst, or fibroids.  Removal of the uterus (hysterectomy).  Progesterone shots to stop the menstrual period.  Cutting the nerves on the sacrum that go to the female organs (presacral neurectomy).  Electric current to the sacral nerves (sacral nerve stimulation).  Antidepressant medicine.  Psychiatric therapy, counseling, or group therapy.  Exercise and physical therapy.  Meditation and yoga therapy.  Acupuncture.  Follow these instructions at home:  Only take over-the-counter or prescription medicines as directed by your health care provider.  Place a heating pad or hot water bottle on your lower back or abdomen. Do not sleep with the heating pad.  Use aerobic exercises, walking, swimming, biking, and other exercises to help lessen the cramping.  Massage to the lower back or abdomen may help.  Stop smoking.  Avoid alcohol and caffeine. Contact a health care provider if:  Your pain does not get better with medicine.  You have pain with sexual intercourse.  Your pain increases and is not controlled with medicines.  You have abnormal vaginal bleeding with your period.  You develop nausea or vomiting with your period that is not controlled with medicine. Get help right away if: You pass out. This information is not intended to replace advice given to you by your health care provider. Make sure you  discuss any questions you have with your health care provider. Document Released: 09/03/2005 Document Revised: 02/09/2016 Document Reviewed: 02/19/2013 Elsevier Interactive Patient Education  2017 [employer].

## 2018-01-17 NOTE — MAU Provider Note (Addendum)
Chief Complaint:  Abdominal Pain   First Provider Initiated Contact with Patient 01/17/18 [phone]     HPI: Laurian Heart is a 22 y.o. G1P0010 who presents to maternity admissions reporting severe abdominal/pelvic pain from endometriosis. Started menses today.  Passing large clots.  Cramping started yesterday but pain increased when menses began.  Has nausea and vomiting also. Retching while I interviewed her. . She reports vaginal bleeding, but no vaginal itching/burning, urinary symptoms, h/a, dizziness, or fever/chills.    Long history of endometriosis.  Wants diagnostic/therapeutic surgery but states her doctor "won't do it".  States she tried the physical therapy that was suggested and it did not work.   Last seen here for this on 10/21/17, treated with Toradol and referred back to Dr Charlotta Newton.  Database review showed 10 tabs hydrocodone on 4/23 and 5 tabs oxycodone on 10/21/17.  Abdominal Pain  This is a recurrent problem. The current episode started yesterday. The problem occurs constantly. The problem has been gradually worsening. The pain is located in the LLQ, RLQ and suprapubic region. The pain is severe. The quality of the pain is cramping, sharp and tearing. The abdominal pain does not radiate. Associated symptoms include nausea and vomiting. Pertinent negatives include no constipation, diarrhea, dysuria, fever, frequency or myalgias. The pain is aggravated by palpation and certain positions. The pain is relieved by nothing. She has tried nothing for the symptoms. Her past medical history is significant for irritable bowel syndrome.   RN note: Pt here with c/o pain from endometriosis and having large blood clots. Also having nausea the last few days.    Past Medical History: Past Medical History:  Diagnosis Date  . Anxiety   . Bilateral ovarian cysts   . Diabetes mellitus without complication (HCC)    prediabetic  . GI bleed due to NSAIDs   . Myocardial infarction (HCC)   .  Pseudoseizures   . Seizures (HCC)    pseudoseizures    Past obstetric history: OB History  Gravida Para Term Preterm AB Living  1 0 0 0 1 0  SAB TAB Ectopic Multiple Live Births  1 0 0 0 0    # Outcome Date GA Lbr Len/2nd Weight Sex Delivery Anes PTL Lv  1 SAB             Past Surgical History: Past Surgical History:  Procedure Laterality Date  . ESOPHAGOGASTRODUODENOSCOPY N/A 07/07/2016   Procedure: ESOPHAGOGASTRODUODENOSCOPY (EGD);  Surgeon: Ruffin Frederick, MD;  Location[facility] ENDOSCOPY;  Service: Gastroenterology;  Laterality: N/A;  . HEMORROIDECTOMY      Family History: Family History  Problem Relation Age of Onset  . Hyperlipidemia Mother   . Mental illness Mother   . Hyperlipidemia Father   . Mental illness Brother   . Heart disease Paternal Grandfather   . Hyperlipidemia Paternal Grandfather   . Hypertension Paternal Grandfather   . CAD Other   . Cancer Other     Social History: Social History   Tobacco Use  . Smoking status: Current Some Day Smoker    Packs/day: 0.25    Types: Cigars  . Smokeless tobacco: Never Used  Substance Use Topics  . Alcohol use: Yes    Comment: social  . Drug use: No    Allergies:  Allergies  Allergen Reactions  . Bentyl [Dicyclomine Hcl] Cough    Coughs up blood  . Haldol [Haloperidol Lactate] Other (See Comments)    Psychotic episode  . Tramadol Hives and Itching  .  Latex Itching, Swelling and Rash    Reaction to condoms    Meds:  Medications Prior to Admission  Medication Sig Dispense Refill Last Dose  . diphenhydrAMINE (BENADRYL) 12.5 MG/5ML liquid Take 12.5 mg 4 (four) times daily as needed by mouth for allergies.   Past Month at Unknown time  . etonogestrel (NEXPLANON) 68 MG IMPL implant 1 each by Subdermal route continuous.   08/03/2017 at Unknown time  . FLUoxetine (PROZAC) 10 MG capsule Take 10 mg by mouth daily.   Past Week at Unknown time  . HYDROcodone-acetaminophen (NORCO/VICODIN) 5-325 MG tablet  Take 1 tablet by mouth every 4 (four) hours as needed. 10 tablet 0   . hydrocortisone (PROCTOSOL HC) 2.5 % rectal cream Place 1 application rectally 2 (two) times daily as needed for hemorrhoids or anal itching.   01/06/2018 at Unknown time  . ibuprofen (ADVIL,MOTRIN) 800 MG tablet Take 1 tablet (800 mg total) by mouth 3 (three) times daily. (Patient not taking: Reported on 01/07/2018) 21 tablet 0 Not Taking at Unknown time  . LORazepam (ATIVAN) 0.5 MG tablet Take 1 tablet (0.5 mg total) every 8 (eight) hours as needed by mouth for anxiety. (Patient not taking: Reported on 01/07/2018) 15 tablet 0 Not Taking at Unknown time  . methocarbamol (ROBAXIN) 750 MG tablet Take 1-2 tablets (750-1,500 mg total) by mouth 3 (three) times daily as needed for muscle spasms. 18 tablet 0   . omeprazole (PRILOSEC) 40 MG capsule Take 40 mg by mouth 2 (two) times daily as needed (for reflux).   1 01/06/2018 at Unknown time  . ondansetron (ZOFRAN ODT) 4 MG disintegrating tablet Take 1 tablet (4 mg total) by mouth every 8 (eight) hours as needed for nausea or vomiting. (Patient not taking: Reported on 01/07/2018) 15 tablet 0 Not Taking at Unknown time  . oxyCODONE-acetaminophen (PERCOCET/ROXICET) 5-325 MG tablet Take 1 tablet by mouth every 4 (four) hours as needed. (Patient not taking: Reported on 01/07/2018) 5 tablet 0 Not Taking at Unknown time  . polyethylene glycol (MIRALAX / GLYCOLAX) packet Take 17 g by mouth daily as needed for mild constipation.   Past Month at Unknown time    I have reviewed patient's Past Medical Hx, Surgical Hx, Family Hx, Social Hx, medications and allergies.  ROS:  Review of Systems  Constitutional: Negative for fever.  Gastrointestinal: Positive for abdominal pain, nausea and vomiting. Negative for constipation and diarrhea.  Genitourinary: Negative for dysuria and frequency.  Musculoskeletal: Negative for myalgias.   Other systems negative     Physical Exam   Patient Vitals for the  past 24 hrs:  BP Temp Temp src Pulse Resp SpO2 Height Weight  01/17/18 1041 111/69 - - (!) 55 18 - - -  01/17/18 0902 - - - - - 99 % - -  01/17/18 0859 127/78 - - (!) 56 18 - - -  01/17/18 0853 (!) 118/57 - - (!) 58 18 - - -  01/17/18 0653 (!) 134/95 97.9 F (36.6 C) Oral 87 20 99 % 5' 5.5" (1.664 m) 256 lb (116.1 kg)   Constitutional: Well-developed, well-nourished female in no acute distress., but writhing in pain, knees drawn up   Was vomiting earlier.  Cardiovascular: normal rate and rhythm, no ectopy audible, S1 & S2 heard, no murmur Respiratory: normal effort, no distress. Lungs CTAB with no wheezes or crackles GI: Abd soft, tender over lower abdomen.  Nondistended.  Moderate rebound, No guarding MS: Extremities nontender, no edema, normal ROM Neurologic:  Alert and oriented x 4.   Grossly nonfocal. GU: Neg CVAT. Skin:  Warm and Dry Psych:  Affect appropriate.   Labs: CBC and CMET ordered    Imaging:  US Pelvic Complete W Transvaginal And Torsion R/o  Result Date: 01/17/2018 CLINICAL DATA:  Lower right abdominal pain. EXAM: TRANSABDOMINAL AND TRANSVAGINAL ULTRASOUND OF PELVIS TECHNIQUE: Both transabdominal and transvaginal ultrasound examinations of the pelvis were performed. Transabdominal technique was performed for global imaging of the pelvis including uterus, ovaries, adnexal regions, and pelvic cul-de-sac. It was necessary to proceed with endovaginal exam following the transabdominal exam to visualize the uterus, endometrium, ovaries and adnexa. COMPARISON:  08/03/2017 FINDINGS: Uterus Measurements: 7.0 x 3.5 x 3.4 cm. Heterogeneous appearance throughout the myometrium without visible measurable focal fibroid Endometrium Thickness: Difficult to visualize due to the heterogeneous echotexture throughout the uterus, 10 mm. No focal abnormality visualized. Right ovary Measurements: 2.3 x 1.3 x 1.5 cm. Normal appearance/no adnexal mass. Left ovary Measurements: 5.0 x 5.1 x 4.6 cm  minimally complex left ovarian cyst measures 4.4 x 4.0 x 3.7 cm with scattered internal echoes. Other findings No abnormal free fluid. IMPRESSION: No evidence of ovarian torsion. 4.4 cm minimally complex left ovarian cyst. This is almost certainly benign, and no specific imaging follow up is recommended according to the Society of Radiologists in Ultrasound2010 Consensus Conference Statement (D Lenis Noon et al. Management of Asymptomatic Ovarian and Other Adnexal Cysts Imaged at [place]: Society of Radiologists in Ultrasound Consensus Conference Statement 2010. Radiology 256 (Sept 2010): 943-954.). Heterogeneous echotexture throughout the myometrium, likely adenomyosis. Electronically Signed   By: Charlett Nose M.D.   On: 01/17/2018 09:49     MAU Course/MDM: I have ordered labs as follows:  CBC and CMET   Assessment:   Plan: Care turned over to oncoming provider  Wynelle Bourgeois CNM, MSN Certified Nurse-Midwife 01/17/2018 7:06 AM     Patient initially signed in under Dr. Charlotta Newton. I spoke with Dr. Charlotta Newton who reviewed office chart. Patient was dismissed from practice in February d/t no shows and was last seen in the office in November 2017.   Ultrasound shows left ovarian cyst and possible adenomyosis. Discussed results with patient. Discussed with patient that she needs to establish care with a gyn to appropriately evaluate and treat her symptoms. Will d/c home with rx nsaids to be used on schedule during her cycles and will give list of gyns in the area. Patient agreeable with plan.    A: 1. Dysmenorrhea   2. RLQ abdominal pain   3. Hx of ovarian cyst    P: Discharge home Rx naproxen to use during menses Discussed reasons to return to MAU Establish care with ob/gyn  Judeth Horn, NP

## 2018-01-17 NOTE — Progress Notes (Signed)
Pt. Complains of lower rt abd. Pain that radiates to middle of abd. Pt. Demonstrated rebound tenderness during exam

## 2018-01-17 NOTE — MAU Note (Addendum)
Pt here with c/o pain from endometriosis and having large blood clots. Also having nausea the last few days.

## 2018-02-14 ENCOUNTER — Other Ambulatory Visit: Payer: Self-pay

## 2018-02-14 ENCOUNTER — Encounter (HOSPITAL_COMMUNITY): Payer: Self-pay

## 2018-02-14 ENCOUNTER — Inpatient Hospital Stay (HOSPITAL_COMMUNITY)
Admission: AD | Admit: 2018-02-14 | Discharge: 2018-02-14 | Disposition: A | Discharge status: Home / Self Care | Payer: PRIVATE HEALTH INSURANCE | Source: Ambulatory Visit | Attending: Obstetrics and Gynecology | Admitting: Obstetrics and Gynecology

## 2018-02-14 DIAGNOSIS — N946 Dysmenorrhea, unspecified: Secondary | ICD-10-CM | POA: Diagnosis not present

## 2018-02-14 DIAGNOSIS — R1032 Left lower quadrant pain: Secondary | ICD-10-CM | POA: Diagnosis present

## 2018-02-14 DIAGNOSIS — N83202 Unspecified ovarian cyst, left side: Secondary | ICD-10-CM | POA: Insufficient documentation

## 2018-02-14 DIAGNOSIS — N809 Endometriosis, unspecified: Secondary | ICD-10-CM

## 2018-02-14 DIAGNOSIS — N8003 Adenomyosis of the uterus: Secondary | ICD-10-CM

## 2018-02-14 DIAGNOSIS — E119 Type 2 diabetes mellitus without complications: Secondary | ICD-10-CM | POA: Insufficient documentation

## 2018-02-14 DIAGNOSIS — N939 Abnormal uterine and vaginal bleeding, unspecified: Secondary | ICD-10-CM | POA: Diagnosis present

## 2018-02-14 DIAGNOSIS — Z888 Allergy status to other drugs, medicaments and biological substances status: Secondary | ICD-10-CM | POA: Diagnosis not present

## 2018-02-14 DIAGNOSIS — I252 Old myocardial infarction: Secondary | ICD-10-CM | POA: Diagnosis not present

## 2018-02-14 DIAGNOSIS — N83201 Unspecified ovarian cyst, right side: Secondary | ICD-10-CM | POA: Insufficient documentation

## 2018-02-14 DIAGNOSIS — N800 Endometriosis of uterus: Secondary | ICD-10-CM | POA: Diagnosis not present

## 2018-02-14 DIAGNOSIS — Z79899 Other long term (current) drug therapy: Secondary | ICD-10-CM | POA: Diagnosis not present

## 2018-02-14 DIAGNOSIS — F17210 Nicotine dependence, cigarettes, uncomplicated: Secondary | ICD-10-CM | POA: Diagnosis not present

## 2018-02-14 LAB — CBC
HCT: 37.6 % (ref 36.0–46.0)
HEMOGLOBIN: 11.7 g/dL — AB (ref 12.0–15.0)
MCH: 24.2 pg — ABNORMAL LOW (ref 26.0–34.0)
MCHC: 31.1 g/dL (ref 30.0–36.0)
MCV: 77.8 fL — ABNORMAL LOW (ref 78.0–100.0)
Platelets: 248 10*3/uL (ref 150–400)
RBC: 4.83 MIL/uL (ref 3.87–5.11)
RDW: 15.4 % (ref 11.5–15.5)
WBC: 9.2 10*3/uL (ref 4.0–10.5)

## 2018-02-14 LAB — URINALYSIS, ROUTINE W REFLEX MICROSCOPIC
Bilirubin Urine: NEGATIVE
Glucose, UA: NEGATIVE mg/dL
KETONES UR: NEGATIVE mg/dL
Leukocytes, UA: NEGATIVE
Nitrite: NEGATIVE
PROTEIN: NEGATIVE mg/dL
Specific Gravity, Urine: 1.025 (ref 1.005–1.030)
pH: 7 (ref 5.0–8.0)

## 2018-02-14 LAB — POCT PREGNANCY, URINE: PREG TEST UR: NEGATIVE

## 2018-02-14 MED ORDER — HYDROMORPHONE HCL 1 MG/ML IJ SOLN
0.5000 mg | Freq: Once | INTRAMUSCULAR | Status: AC
  Administered 2018-02-14: 0.5 mg via INTRAMUSCULAR
  Filled 2018-02-14: qty 1

## 2018-02-14 MED ORDER — PROMETHAZINE HCL 25 MG PO TABS: 25.0000 mg | ORAL_TABLET | Freq: Four times a day (QID) | ORAL | 0 refills | Status: DC | PRN

## 2018-02-14 MED ORDER — KETOROLAC TROMETHAMINE 30 MG/ML IJ SOLN
30.0000 mg | Freq: Once | INTRAMUSCULAR | Status: AC
Start: 2018-02-14 — End: 2018-02-14
  Administered 2018-02-14: 30 mg via INTRAMUSCULAR
  Filled 2018-02-14: qty 1

## 2018-02-14 MED ORDER — PROMETHAZINE HCL 25 MG/ML IJ SOLN
12.5000 mg | Freq: Once | INTRAMUSCULAR | Status: AC
Start: 2018-02-14 — End: 2018-02-14
  Administered 2018-02-14: 12.5 mg via INTRAMUSCULAR
  Filled 2018-02-14: qty 1

## 2018-02-14 NOTE — Progress Notes (Addendum)
Nonpregn. Here for bleeding and severe abd pain that has been ongoing for past 3 years.Dr. Charlotta Newtonzan is well aware of her issue and has recommended therapy of some sort. No longer seeing that practice  Pt states took naproxin 1000mg  at 0430 and states helped an hour. States was suppose to work at 0600 but didn't have ride until brother could come get her as to reason coming to MAU at this time.   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   1045: Medicated per order  1112: provider at bs assessing. Bimanual VE done.   1145: medicated per order. Dilaudid ordered was given more than ordered. Provider made aware.   Pharmacy notified and made aware   1236: D/c instructions given with pt understanding.   1242: Pt left unit via ambulatory

## 2018-02-14 NOTE — MAU Provider Note (Addendum)
Chief Complaint: Vaginal Bleeding and Abdominal Pain   SUBJECTIVE HPI: Ashlee Thompson is a 22 y.o. G1P0010 at Unknown who presents to MAU vaginal bleeding that is light and similar to spotting with dark blood clots and LLQ abdominal pain.  The spotting is different from her menstrual cycle because it is usually profuse heavy bleeding.  She had mild cramping yesterday that was relieved with Naproxen but worsened today.  This morning she began have severe pain that feels like a hot knife stabbing her.  She has experienced similar pain during her menstrual cycle.  She believes she is early for her cycle which is usually every 2-3 months and either on-time or late.  She is expecting her period to start in the next 2 weeks.  She has not eaten anything for the past day and has drank several bottles of water and Gatorade. She had her Nexplanon placed in Oct/Nov 2016.  She had primary care at [facility], but has not been able to establish care since she turned 21 and no longer has Medicaid.  Offices that she has called does not accept her [employer].    LMP December 05, 2017  Last sexual intercourse Last Wednesday May 29, used condoms   Past Medical History:  Diagnosis Date  . Anxiety    not taking meds  . Bilateral ovarian cysts   . Diabetes mellitus without complication (HCC)    prediabetic  . GI bleed due to NSAIDs   . Myocardial infarction (HCC)    2017   . Pseudoseizures   . Seizures (HCC)    pseudoseizures   OB History  Gravida Para Term Preterm AB Living  1 0 0 0 1 0  SAB TAB Ectopic Multiple Live Births  1 0 0 0 0    # Outcome Date GA Lbr Len/2nd Weight Sex Delivery Anes PTL Lv  1 SAB            Past Surgical History:  Procedure Laterality Date  . ESOPHAGOGASTRODUODENOSCOPY N/A 07/07/2016   Procedure: ESOPHAGOGASTRODUODENOSCOPY (EGD);  Surgeon: Ruffin Frederick, MD;  Location: [facility] ENDOSCOPY;  Service: Gastroenterology;  Laterality: N/A;  . HEMORROIDECTOMY     Social  History   Socioeconomic History  . Marital status: Single    Spouse name: Not on file  . Number of children: Not on file  . Years of education: Not on file  . Highest education level: Not on file  Occupational History  . Not on file  Social Needs  . Financial resource strain: Not on file  . Food insecurity:    Worry: Not on file    Inability: Not on file  . Transportation needs:    Medical: Not on file    Non-medical: Not on file  Tobacco Use  . Smoking status: Current Some Day Smoker    Packs/day: 0.25    Types: Cigars  . Smokeless tobacco: Never Used  Substance and Sexual Activity  . Alcohol use: Yes    Comment: social  . Drug use: Yes    Types: Marijuana    Comment: yesterday at 1500  . Sexual activity: Yes    Birth control/protection: Implant  Lifestyle  . Physical activity:    Days per week: Not on file    Minutes per session: Not on file  . Stress: Not on file  Relationships  . Social connections:    Talks on phone: Not on file    Gets together: Not on file    Attends  religious service: Not on file    Active member of club or organization: Not on file    Attends meetings of clubs or organizations: Not on file    Relationship status: Not on file  . Intimate partner violence:    Fear of current or ex partner: Not on file    Emotionally abused: Not on file    Physically abused: Not on file    Forced sexual activity: Not on file  Other Topics Concern  . Not on file  Social History Narrative  . Not on file   No current facility-administered medications on file prior to encounter.    Current Outpatient Medications on File Prior to Encounter  Medication Sig Dispense Refill  . diphenhydrAMINE (BENADRYL) 12.5 MG/5ML liquid Take 12.5 mg 4 (four) times daily as needed by mouth for allergies.    Marland Kitchen etonogestrel (NEXPLANON) 68 MG IMPL implant 1 each by Subdermal route continuous.    Marland Kitchen FLUoxetine (PROZAC) 10 MG capsule Take 10 mg by mouth daily.    Marland Kitchen  HYDROcodone-acetaminophen (NORCO/VICODIN) 5-325 MG tablet Take 1 tablet by mouth every 4 (four) hours as needed. 10 tablet 0  . hydrocortisone (PROCTOSOL HC) 2.5 % rectal cream Place 1 application rectally 2 (two) times daily as needed for hemorrhoids or anal itching.    . methocarbamol (ROBAXIN) 750 MG tablet Take 1-2 tablets (750-1,500 mg total) by mouth 3 (three) times daily as needed for muscle spasms. 18 tablet 0  . naproxen (NAPROSYN) 500 MG tablet Take 1 tablet (500 mg total) by mouth 2 (two) times daily with a meal. Take 1 tablet by mouth 2 times per day while on your period. 15 tablet 3  . omeprazole (PRILOSEC) 40 MG capsule Take 40 mg by mouth 2 (two) times daily as needed (for reflux).   1  . polyethylene glycol (MIRALAX / GLYCOLAX) packet Take 17 g by mouth daily as needed for mild constipation.     Allergies  Allergen Reactions  . Bentyl [Dicyclomine Hcl] Cough    Coughs up blood  . Haldol [Haloperidol Lactate] Other (See Comments)    Psychotic episode  . Tramadol Hives and Itching  . Latex Itching, Swelling and Rash    Reaction to condoms    I have reviewed the past Medical Hx, Surgical Hx, Social Hx, Allergies and Medications.   REVIEW OF SYSTEMS Endorses N/V, Vomiting 3x, HA chills sweats, wrenching left sided chest pain, SOB, cloudy discharge without odoor, last BM tiny pellets  Denies fever, rash, swelling, dysuria,  long distance travel.     OBJECTIVE BP (!) 152/83   Pulse 69   Temp 97.9 F (36.6 C) (Oral)   Resp 16    PHYSICAL EXAM Constitutional: Well-developed, well-nourished female, laying on side in distress from pain Cardiovascular: normal rate and rhythm, pulses intact Respiratory: normal rate and effort.  GI: Abd soft, ttp LLQ, non-distended. Normoactive BS MS: Extremities nontender, no edema, normal ROM Neurologic: Alert and oriented x 4. No focal deficits GU: Neg CVAT. SPECULUM EXAM:  Deferred BIMANUAL: cervix closed, firm, anterior, smooth, neg  CMT,  uterus nml , no adnexal tenderness, enlargement or masses. +bright red blood  Psych: normal mood and affect  LAB RESULTS Results for orders placed or performed during the hospital encounter of 02/14/18 (from the past 24 hour(s))  Urinalysis, Routine w reflex microscopic     Status: Abnormal   Collection Time: 02/14/18  9:10 AM  Result Value Ref Range   Color, Urine YELLOW YELLOW  APPearance CLOUDY (A) CLEAR   Specific Gravity, Urine 1.025 1.005 - 1.030   pH 7.0 5.0 - 8.0   Glucose, UA NEGATIVE NEGATIVE mg/dL   Hgb urine dipstick LARGE (A) NEGATIVE   Bilirubin Urine NEGATIVE NEGATIVE   Ketones, ur NEGATIVE NEGATIVE mg/dL   Protein, ur NEGATIVE NEGATIVE mg/dL   Nitrite NEGATIVE NEGATIVE   Leukocytes, UA NEGATIVE NEGATIVE   RBC / HPF >50 (H) 0 - 5 RBC/hpf   WBC, UA 0-5 0 - 5 WBC/hpf   Bacteria, UA RARE (A) NONE SEEN   Squamous Epithelial / LPF 0-5 0 - 5   Mucus PRESENT   Pregnancy, urine POC     Status: None   Collection Time: 02/14/18  9:58 AM  Result Value Ref Range   Preg Test, Ur NEGATIVE NEGATIVE  CBC     Status: Abnormal   Collection Time: 02/14/18 10:35 AM  Result Value Ref Range   WBC 9.2 4.0 - 10.5 K/uL   RBC 4.83 3.87 - 5.11 MIL/uL   Hemoglobin 11.7 (L) 12.0 - 15.0 g/dL   HCT 16.1 09.6 - 04.5 %   MCV 77.8 (L) 78.0 - 100.0 fL   MCH 24.2 (L) 26.0 - 34.0 pg   MCHC 31.1 30.0 - 36.0 g/dL   RDW 40.9 81.1 - 91.4 %   Platelets 248 150 - 400 K/uL    IMAGING   MAU Management/MDM: Vitals and nursing notes reviewed Orders Placed This Encounter  Procedures  . Urinalysis, Routine w reflex microscopic  . CBC  . Pregnancy, urine POC  . Discharge patient    Ashlee Thompson is a 22 y.o. G1P0010 with PMHx of IBS and adenomyosis who presents to MAU vaginal bleeding similar to spotting with dark blood clots and LLQ abdominal pain.  Symptoms may correlate with onset of menstrual cycle since she's had similar symptoms prior, is due for her period, and has hx of  adenomyosis. Urine pregnancy test was negative, no concern for ectopic pregnancy or SAB.  Differential for infectious etiology of LLQ abd pain includes STIs such as gonorrhea, chlamydia, trichomonas.  Other infectious causes include UTI or diverticulitis.  Unlikely an STI given prior neg test results, absence of fever, and lack of purulent discharge.  Unlikely UTI since patient denies dysuria, increased in urgency/frequency (patient has overactive bladder at baseline), and UA was neg.  Diverticulitis is possible given location of pain and history of constipation.  Will evaluate for ovarian torsion with bimanual exam.    - CBC: WBC 9.2, Hgb 11.7 - UA: neg for nitrites, +RBC, Hgb Large, rare bacteria  Plan of care reviewed with patient, including labs and tests ordered and medical treatment.  Consult none.  Treatments in MAU included Toradol  IM x1, Dilaudid 0.5mg  IM x1, Promethazine 12.5mg  IM x1.   ASSESSMENT Dysmenorrhea Adenomyosis  PLAN Discharge home in stable condition. Recommend daily stool softener for constipation Start phenergan as needed for nausea Counseled on return precautions if symptoms worsen Message sent for [facility] to establish care  Christel Mormon, Medical Student 02/14/2018, 9:35 AM     I have seen and evaluated the patient with the NP/PA/Med student. I agree with the assessment and plan as written above. See provider note for full documentation  Ashlee Thompson has been seen multiple times in MAU with same complaint. Has not been able to establish gyn care as previously instructed d/t insurance issues. Will send message to [facility] to try to establish care there. Last ultrasound concerning for adenomyosis.  Pt afebrile with stable VS and normal CBC. No masses palpated on bimanual exam.  LLQ pain could be d/t her ongoing GI issues that she sees gastroenterology for or her gyn issues. Pt to f/u with GI and gyn. Continue to take NSAIDs on a schedule. Phenergan rx to pharmacy  since she can't afford zofran that was previously prescribed.   Judeth Horn, NP  02/14/2018 3:03 PM

## 2018-02-14 NOTE — Discharge Instructions (Signed)
Dysmenorrhea °Menstrual cramps (dysmenorrhea) are caused by the muscles of the uterus tightening (contracting) during a menstrual period. For some women, this discomfort is merely bothersome. For others, dysmenorrhea can be severe enough to interfere with everyday activities for a few days each month. °Primary dysmenorrhea is menstrual cramps that last a couple of days when you start having menstrual periods or soon after. This often begins after a teenager starts having her period. As a woman gets older or has a baby, the cramps will usually lessen or disappear. Secondary dysmenorrhea begins later in life, lasts longer, and the pain may be stronger than primary dysmenorrhea. The pain may start before the period and last a few days after the period. °What are the causes? °Dysmenorrhea is usually caused by an underlying problem, such as: °· The tissue lining the uterus grows outside of the uterus in other areas of the body (endometriosis). °· The endometrial tissue, which normally lines the uterus, is found in or grows into the muscular walls of the uterus (adenomyosis). °· The pelvic blood vessels are engorged with blood just before the menstrual period (pelvic congestive syndrome). °· Overgrowth of cells (polyps) in the lining of the uterus or cervix. °· Falling down of the uterus (prolapse) because of loose or stretched ligaments. °· Depression. °· Bladder problems, infection, or inflammation. °· Problems with the intestine, a tumor, or irritable bowel syndrome. °· Cancer of the female organs or bladder. °· A severely tipped uterus. °· A very tight opening or closed cervix. °· Noncancerous tumors of the uterus (fibroids). °· Pelvic inflammatory disease (PID). °· Pelvic scarring (adhesions) from a previous surgery. °· Ovarian cyst. °· An intrauterine device (IUD) used for birth control. °What increases the risk? °You may be at greater risk of dysmenorrhea if: °· You are younger than age 30. °· You started puberty  early. °· You have irregular or heavy bleeding. °· You have never given birth. °· You have a family history of this problem. °· You are a smoker. °What are the signs or symptoms? °· Cramping or throbbing pain in your lower abdomen. °· Headaches. °· Lower back pain. °· Nausea or vomiting. °· Diarrhea. °· Sweating or dizziness. °· Loose stools. °How is this diagnosed? °A diagnosis is based on your history, symptoms, physical exam, diagnostic tests, or procedures. Diagnostic tests or procedures may include: °· Blood tests. °· Ultrasonography. °· An examination of the lining of the uterus (dilation and curettage, D&C). °· An examination inside your abdomen or pelvis with a scope (laparoscopy). °· X-rays. °· CT scan. °· MRI. °· An examination inside the bladder with a scope (cystoscopy). °· An examination inside the intestine or stomach with a scope (colonoscopy, gastroscopy). °How is this treated? °Treatment depends on the cause of the dysmenorrhea. Treatment may include: °· Pain medicine prescribed by your health care provider. °· Birth control pills or an IUD with progesterone hormone in it. °· Hormone replacement therapy. °· Nonsteroidal anti-inflammatory drugs (NSAIDs). These may help stop the production of prostaglandins. °· Surgery to remove adhesions, endometriosis, ovarian cyst, or fibroids. °· Removal of the uterus (hysterectomy). °· Progesterone shots to stop the menstrual period. °· Cutting the nerves on the sacrum that go to the female organs (presacral neurectomy). °· Electric current to the sacral nerves (sacral nerve stimulation). °· Antidepressant medicine. °· Psychiatric therapy, counseling, or group therapy. °· Exercise and physical therapy. °· Meditation and yoga therapy. °· Acupuncture. °Follow these instructions at home: °· Only take over-the-counter or prescription medicines as directed   by your health care provider. °· Place a heating pad or hot water bottle on your lower back or abdomen. Do not  sleep with the heating pad. °· Use aerobic exercises, walking, swimming, biking, and other exercises to help lessen the cramping. °· Massage to the lower back or abdomen may help. °· Stop smoking. °· Avoid alcohol and caffeine. °Contact a health care provider if: °· Your pain does not get better with medicine. °· You have pain with sexual intercourse. °· Your pain increases and is not controlled with medicines. °· You have abnormal vaginal bleeding with your period. °· You develop nausea or vomiting with your period that is not controlled with medicine. °Get help right away if: °You pass out. °This information is not intended to replace advice given to you by your health care provider. Make sure you discuss any questions you have with your health care provider. °Document Released: 09/03/2005 Document Revised: 02/09/2016 Document Reviewed: 02/19/2013 °Elsevier Interactive Patient Education © 2017 [employer] ° °

## 2018-03-05 ENCOUNTER — Encounter: Payer: PRIVATE HEALTH INSURANCE | Admitting: Obstetrics and Gynecology

## 2018-03-05 ENCOUNTER — Encounter: Payer: Self-pay | Admitting: Obstetrics and Gynecology

## 2018-03-06 NOTE — Progress Notes (Signed)
Patient did not keep GYN appointment for 03/06/2018.  Ashlee Thompson, Jr MD Attending [facility] (Faculty Practice)   

## 2018-03-22 ENCOUNTER — Encounter (HOSPITAL_COMMUNITY): Payer: Self-pay | Admitting: Emergency Medicine

## 2018-03-22 ENCOUNTER — Emergency Department (HOSPITAL_COMMUNITY)
Admission: EM | Admit: 2018-03-22 | Discharge: 2018-03-22 | Disposition: A | Discharge status: Home / Self Care | Payer: PRIVATE HEALTH INSURANCE | Attending: Emergency Medicine | Admitting: Emergency Medicine

## 2018-03-22 DIAGNOSIS — Z91040 Latex allergy status: Secondary | ICD-10-CM | POA: Diagnosis not present

## 2018-03-22 DIAGNOSIS — E119 Type 2 diabetes mellitus without complications: Secondary | ICD-10-CM | POA: Diagnosis not present

## 2018-03-22 DIAGNOSIS — Z79899 Other long term (current) drug therapy: Secondary | ICD-10-CM | POA: Diagnosis not present

## 2018-03-22 DIAGNOSIS — N939 Abnormal uterine and vaginal bleeding, unspecified: Secondary | ICD-10-CM | POA: Diagnosis present

## 2018-03-22 DIAGNOSIS — F17290 Nicotine dependence, other tobacco product, uncomplicated: Secondary | ICD-10-CM | POA: Diagnosis not present

## 2018-03-22 LAB — CBC WITH DIFFERENTIAL/PLATELET
Abs Immature Granulocytes: 0 10*3/uL (ref 0.0–0.1)
BASOS ABS: 0 10*3/uL (ref 0.0–0.1)
Basophils Relative: 0 %
EOS ABS: 0.1 10*3/uL (ref 0.0–0.7)
EOS PCT: 2 %
HCT: 41.8 % (ref 36.0–46.0)
Hemoglobin: 12.6 g/dL (ref 12.0–15.0)
Immature Granulocytes: 1 %
Lymphocytes Relative: 39 %
Lymphs Abs: 2.7 10*3/uL (ref 0.7–4.0)
MCH: 23.6 pg — AB (ref 26.0–34.0)
MCHC: 30.1 g/dL (ref 30.0–36.0)
MCV: 78.1 fL (ref 78.0–100.0)
MONO ABS: 0.8 10*3/uL (ref 0.1–1.0)
Monocytes Relative: 12 %
Neutro Abs: 3.3 10*3/uL (ref 1.7–7.7)
Neutrophils Relative %: 46 %
Platelets: 272 10*3/uL (ref 150–400)
RBC: 5.35 MIL/uL — AB (ref 3.87–5.11)
RDW: 14.8 % (ref 11.5–15.5)
WBC: 7 10*3/uL (ref 4.0–10.5)

## 2018-03-22 LAB — I-STAT BETA HCG BLOOD, ED (MC, WL, AP ONLY)

## 2018-03-22 MED ORDER — ONDANSETRON HCL 4 MG PO TABS: 4.0000 mg | ORAL_TABLET | Freq: Four times a day (QID) | ORAL | 0 refills | Status: DC

## 2018-03-22 MED ORDER — HYDROCODONE-ACETAMINOPHEN 5-325 MG PO TABS
1.0000 | ORAL_TABLET | Freq: Once | ORAL | Status: AC
  Administered 2018-03-22: 1 via ORAL
  Filled 2018-03-22: qty 1

## 2018-03-22 MED ORDER — ONDANSETRON HCL 4 MG/2ML IJ SOLN
4.0000 mg | Freq: Once | INTRAMUSCULAR | Status: AC
  Administered 2018-03-22: 4 mg via INTRAVENOUS
  Filled 2018-03-22: qty 2

## 2018-03-22 MED ORDER — KETOROLAC TROMETHAMINE 15 MG/ML IJ SOLN
15.0000 mg | Freq: Once | INTRAMUSCULAR | Status: AC
  Administered 2018-03-22: 15 mg via INTRAVENOUS
  Filled 2018-03-22: qty 1

## 2018-03-22 MED ORDER — NAPROXEN 500 MG PO TABS: 500.0000 mg | ORAL_TABLET | Freq: Two times a day (BID) | ORAL | 0 refills | Status: DC

## 2018-03-22 NOTE — ED Triage Notes (Signed)
Pt reports vaginal bleeding and vaginal/rectal pain, states that she has had 3 episodes of vaginal bleeding lasting 7 days in the past month. Has nexplannon that she thinks has been in about 4 years, is unable to get information about date of placement of device from her obgyn. Reports vaginal bleeding is heavy with small clots.

## 2018-03-22 NOTE — ED Provider Notes (Signed)
[facility] EMERGENCY DEPARTMENT Provider Note   CSN: [id number] Arrival date & time: 03/22/18  0906     History   Chief Complaint Chief Complaint  Patient presents with  . Vaginal Bleeding    HPI Ashlee Thompson is a 22 y.o. female.  22 year old female with prior history as detailed below presents with complaint of irregular vaginal bleeding.  Patient reports irregular vaginal bleeding over the last month.  She reports heavy bleeding at times.  She denies associated fever, chest pain, shortness breath, nausea, or vomiting.  She does report associated lower abdominal cramping that is intermittent.  She denies any abdominal pain at this time.  She reports that she had a Nexplanon implant placed approximately 4 years ago.  She believes this implant is good for about 5 years.  The history is provided by the patient.  Vaginal Bleeding  Primary symptoms include vaginal bleeding.  Primary symptoms include no discharge, no pelvic pain. There has been no fever. This is a new problem. The problem occurs constantly. The problem has not changed since onset.She has tried NSAIDs for the symptoms.    Past Medical History:  Diagnosis Date  . Anxiety    not taking meds  . Bilateral ovarian cysts   . Diabetes mellitus without complication (HCC)    prediabetic  . GI bleed due to NSAIDs   . Myocardial infarction (HCC)    2017   . Pseudoseizures   . Seizures (HCC)    pseudoseizures    Patient Active Problem List   Diagnosis Date Noted  . Headache   . UGI bleed 07/07/2016  . History of diabetes mellitus 07/07/2016  . Morbid obesity (HCC) 07/07/2016  . Hematemesis with nausea 07/07/2016  . GI bleed 07/07/2016    Past Surgical History:  Procedure Laterality Date  . ESOPHAGOGASTRODUODENOSCOPY N/A 07/07/2016   Procedure: ESOPHAGOGASTRODUODENOSCOPY (EGD);  Surgeon: Ruffin Frederick, MD;  Location: [facility] ENDOSCOPY;  Service: Gastroenterology;  Laterality: N/A;  .  HEMORROIDECTOMY       OB History    Gravida  1   Para  0   Term  0   Preterm  0   AB  1   Living  0     SAB  1   TAB  0   Ectopic  0   Multiple  0   Live Births  0            Home Medications    Prior to Admission medications   Medication Sig Start Date End Date Taking? Authorizing Provider  etonogestrel (NEXPLANON) 68 MG IMPL implant 1 each by Subdermal route continuous.   Yes [provider]  omeprazole (PRILOSEC) 40 MG capsule Take 40 mg by mouth 2 (two) times daily as needed (for reflux).  06/08/17  Yes [provider]  polyethylene glycol (MIRALAX / GLYCOLAX) packet Take 17 g by mouth daily as needed for mild constipation.   Yes [provider]  naproxen (NAPROSYN) 500 MG tablet Take 1 tablet (500 mg total) by mouth 2 (two) times daily with a meal. Take 1 tablet by mouth 2 times per day while on your period. Patient not taking: Reported on 03/22/2018 01/17/18   Judeth Horn, NP  naproxen (NAPROSYN) 500 MG tablet Take 1 tablet (500 mg total) by mouth 2 (two) times daily. 03/22/18   Wynetta Fines, MD  ondansetron (ZOFRAN) 4 MG tablet Take 1 tablet (4 mg total) by mouth every 6 (six) hours. 03/22/18  Wynetta FinesMessick, Chareese Sergent C, MD  promethazine (PHENERGAN) 25 MG tablet Take 1 tablet (25 mg total) by mouth every 6 (six) hours as needed for nausea or vomiting. Patient not taking: Reported on 03/22/2018 02/14/18   Judeth HornLawrence, Erin, NP    Family History Family History  Problem Relation Age of Onset  . Hyperlipidemia Mother   . Mental illness Mother   . Hyperlipidemia Father   . Mental illness Brother   . Heart disease Paternal Grandfather   . Hyperlipidemia Paternal Grandfather   . Hypertension Paternal Grandfather   . CAD Other   . Cancer Other     Social History Social History   Tobacco Use  . Smoking status: Current Some Day Smoker    Packs/day: 0.25    Types: Cigars  . Smokeless tobacco: Never Used  Substance Use Topics  . Alcohol use:  Yes    Comment: social  . Drug use: Yes    Types: Marijuana    Comment: yesterday at 1500     Allergies   Bentyl [dicyclomine hcl]; Haldol [haloperidol lactate]; Tramadol; and Latex   Review of Systems Review of Systems  Genitourinary: Positive for vaginal bleeding. Negative for pelvic pain.  All other systems reviewed and are negative.    Physical Exam Updated Vital Signs BP (!) 132/92   Pulse 83   Temp 98.2 F (36.8 C) (Oral)   Resp 18   SpO2 100%   Physical Exam  Constitutional: She is oriented to person, place, and time. She appears well-developed and well-nourished. No distress.  HENT:  Head: Normocephalic and atraumatic.  Mouth/Throat: Oropharynx is clear and moist.  Eyes: Pupils are equal, round, and reactive to light. Conjunctivae and EOM are normal.  Neck: Normal range of motion. Neck supple.  Cardiovascular: Normal rate, regular rhythm and normal heart sounds.  Pulmonary/Chest: Effort normal and breath sounds normal. No respiratory distress.  Abdominal: Soft. She exhibits no distension. There is no tenderness.  Musculoskeletal: Normal range of motion. She exhibits no edema or deformity.  Neurological: She is alert and oriented to person, place, and time.  Skin: Skin is warm and dry.  Psychiatric: She has a normal mood and affect.  Nursing note and vitals reviewed.    ED Treatments / Results  Labs (all labs ordered are listed, but only abnormal results are displayed) Labs Reviewed  CBC WITH DIFFERENTIAL/PLATELET - Abnormal; Notable for the following components:      Result Value   RBC 5.35 (*)    MCH 23.6 (*)    All other components within normal limits  I-STAT BETA HCG BLOOD, ED ([facility], [facility], [facility] ONLY)    EKG None  Radiology No results found.  Procedures Procedures (including critical care time)  Medications Ordered in ED Medications  ondansetron (ZOFRAN) injection 4 mg (4 mg Intravenous Given 03/22/18 1048)  ketorolac (TORADOL) 15 MG/ML  injection 15 mg (15 mg Intravenous Given 03/22/18 1048)     Initial Impression / Assessment and Plan / ED Course  I have reviewed the triage vital signs and the nursing notes.  Pertinent labs & imaging results that were available during my care of the patient were reviewed by me and considered in my medical decision making (see chart for details).     MDM  Screen complete  Patient is presenting for evaluation of abnormal uterine bleeding.  Patient reports irregular menses over the last month.  I suspect that this is breakthrough bleeding as her Nexplanon implant has been in for several years.  H&H today is normal.  Pregnancy test is negative.  Patient feels improved following administration of Toradol and Zofran.  She understands need for close follow-up with GYN.  Strict return precautions are given and understood.  Final Clinical Impressions(s) / ED Diagnoses   Final diagnoses:  Abnormal uterine bleeding (AUB)    ED Discharge Orders        Ordered    ondansetron (ZOFRAN) 4 MG tablet  Every 6 hours     03/22/18 1115    naproxen (NAPROSYN) 500 MG tablet  2 times daily     03/22/18 1115       Wynetta Fines, MD 03/22/18 1119

## 2018-03-22 NOTE — Discharge Instructions (Signed)
Please return for any problem.  Follow-up with your regular doctor as instructed.  Please obtain follow-up with GYN as instructed.

## 2018-05-04 ENCOUNTER — Inpatient Hospital Stay (HOSPITAL_COMMUNITY)
Admission: AD | Admit: 2018-05-04 | Discharge: 2018-05-04 | Disposition: A | Discharge status: Home / Self Care | Payer: PRIVATE HEALTH INSURANCE | Source: Ambulatory Visit | Attending: Obstetrics and Gynecology | Admitting: Obstetrics and Gynecology

## 2018-05-04 ENCOUNTER — Encounter (HOSPITAL_COMMUNITY): Payer: Self-pay | Admitting: *Deleted

## 2018-05-04 DIAGNOSIS — Z3202 Encounter for pregnancy test, result negative: Secondary | ICD-10-CM | POA: Diagnosis not present

## 2018-05-04 DIAGNOSIS — R1030 Lower abdominal pain, unspecified: Secondary | ICD-10-CM | POA: Diagnosis present

## 2018-05-04 DIAGNOSIS — N946 Dysmenorrhea, unspecified: Secondary | ICD-10-CM | POA: Insufficient documentation

## 2018-05-04 DIAGNOSIS — F17210 Nicotine dependence, cigarettes, uncomplicated: Secondary | ICD-10-CM | POA: Insufficient documentation

## 2018-05-04 LAB — URINALYSIS, ROUTINE W REFLEX MICROSCOPIC
BACTERIA UA: NONE SEEN
Bilirubin Urine: NEGATIVE
Glucose, UA: NEGATIVE mg/dL
Ketones, ur: NEGATIVE mg/dL
Leukocytes, UA: NEGATIVE
Nitrite: NEGATIVE
PROTEIN: NEGATIVE mg/dL
Specific Gravity, Urine: 1.017 (ref 1.005–1.030)
pH: 6 (ref 5.0–8.0)

## 2018-05-04 LAB — CBC
HEMATOCRIT: 39.6 % (ref 36.0–46.0)
HEMOGLOBIN: 12.9 g/dL (ref 12.0–15.0)
MCH: 24.2 pg — ABNORMAL LOW (ref 26.0–34.0)
MCHC: 32.6 g/dL (ref 30.0–36.0)
MCV: 74.3 fL — ABNORMAL LOW (ref 78.0–100.0)
Platelets: 287 10*3/uL (ref 150–400)
RBC: 5.33 MIL/uL — AB (ref 3.87–5.11)
RDW: 15.2 % (ref 11.5–15.5)
WBC: 9.4 10*3/uL (ref 4.0–10.5)

## 2018-05-04 LAB — WET PREP, GENITAL
CLUE CELLS WET PREP: NONE SEEN
SPERM: NONE SEEN
TRICH WET PREP: NONE SEEN
YEAST WET PREP: NONE SEEN

## 2018-05-04 LAB — POCT PREGNANCY, URINE: PREG TEST UR: NEGATIVE

## 2018-05-04 MED ORDER — IBUPROFEN 800 MG PO TABS: 800.0000 mg | ORAL_TABLET | Freq: Three times a day (TID) | ORAL | 0 refills | Status: DC

## 2018-05-04 MED ORDER — KETOROLAC TROMETHAMINE 60 MG/2ML IM SOLN
60.0000 mg | Freq: Once | INTRAMUSCULAR | Status: AC
  Administered 2018-05-04: 60 mg via INTRAMUSCULAR
  Filled 2018-05-04: qty 2

## 2018-05-04 MED ORDER — ONDANSETRON 8 MG PO TBDP
8.0000 mg | ORAL_TABLET | Freq: Once | ORAL | Status: AC
  Administered 2018-05-04: 8 mg via ORAL
  Filled 2018-05-04: qty 1

## 2018-05-04 NOTE — MAU Note (Signed)
Ashlee Thompson is a 22 y.o. here in MAU reporting:  +lower abdominal and back pain LMP: 04/14/18 Onset of complaint: 3-4 days ago Pain score: 7/10 Has not taken any medications today. Endorses having tried "electrotherapy" and heat packs Describes as cramping and pressure like Constant with intermittent waves of increased pain  +clear odorous discharge Started yesterday She endorses having thought she had a uti or yeast infection Reports external itching and internal vaginal burning Then +vaginal bleeding that started this morning  Vitals:   05/04/18 1053  BP: 135/89  Pulse: 99  Resp: 19  Temp: 97.8 F (36.6 C)  SpO2: 98%     Lab orders placed from triage: ua and poc pregnancy

## 2018-05-04 NOTE — Discharge Instructions (Signed)
Dysmenorrhea °Menstrual cramps (dysmenorrhea) are caused by the muscles of the uterus tightening (contracting) during a menstrual period. For some women, this discomfort is merely bothersome. For others, dysmenorrhea can be severe enough to interfere with everyday activities for a few days each month. °Primary dysmenorrhea is menstrual cramps that last a couple of days when you start having menstrual periods or soon after. This often begins after a teenager starts having her period. As a woman gets older or has a baby, the cramps will usually lessen or disappear. Secondary dysmenorrhea begins later in life, lasts longer, and the pain may be stronger than primary dysmenorrhea. The pain may start before the period and last a few days after the period. °What are the causes? °Dysmenorrhea is usually caused by an underlying problem, such as: °· The tissue lining the uterus grows outside of the uterus in other areas of the body (endometriosis). °· The endometrial tissue, which normally lines the uterus, is found in or grows into the muscular walls of the uterus (adenomyosis). °· The pelvic blood vessels are engorged with blood just before the menstrual period (pelvic congestive syndrome). °· Overgrowth of cells (polyps) in the lining of the uterus or cervix. °· Falling down of the uterus (prolapse) because of loose or stretched ligaments. °· Depression. °· Bladder problems, infection, or inflammation. °· Problems with the intestine, a tumor, or irritable bowel syndrome. °· Cancer of the female organs or bladder. °· A severely tipped uterus. °· A very tight opening or closed cervix. °· Noncancerous tumors of the uterus (fibroids). °· Pelvic inflammatory disease (PID). °· Pelvic scarring (adhesions) from a previous surgery. °· Ovarian cyst. °· An intrauterine device (IUD) used for birth control. °What increases the risk? °You may be at greater risk of dysmenorrhea if: °· You are younger than age 30. °· You started puberty  early. °· You have irregular or heavy bleeding. °· You have never given birth. °· You have a family history of this problem. °· You are a smoker. °What are the signs or symptoms? °· Cramping or throbbing pain in your lower abdomen. °· Headaches. °· Lower back pain. °· Nausea or vomiting. °· Diarrhea. °· Sweating or dizziness. °· Loose stools. °How is this diagnosed? °A diagnosis is based on your history, symptoms, physical exam, diagnostic tests, or procedures. Diagnostic tests or procedures may include: °· Blood tests. °· Ultrasonography. °· An examination of the lining of the uterus (dilation and curettage, D&C). °· An examination inside your abdomen or pelvis with a scope (laparoscopy). °· X-rays. °· CT scan. °· MRI. °· An examination inside the bladder with a scope (cystoscopy). °· An examination inside the intestine or stomach with a scope (colonoscopy, gastroscopy). °How is this treated? °Treatment depends on the cause of the dysmenorrhea. Treatment may include: °· Pain medicine prescribed by your health care provider. °· Birth control pills or an IUD with progesterone hormone in it. °· Hormone replacement therapy. °· Nonsteroidal anti-inflammatory drugs (NSAIDs). These may help stop the production of prostaglandins. °· Surgery to remove adhesions, endometriosis, ovarian cyst, or fibroids. °· Removal of the uterus (hysterectomy). °· Progesterone shots to stop the menstrual period. °· Cutting the nerves on the sacrum that go to the female organs (presacral neurectomy). °· Electric current to the sacral nerves (sacral nerve stimulation). °· Antidepressant medicine. °· Psychiatric therapy, counseling, or group therapy. °· Exercise and physical therapy. °· Meditation and yoga therapy. °· Acupuncture. °Follow these instructions at home: °· Only take over-the-counter or prescription medicines as directed   by your health care provider. °· Place a heating pad or hot water bottle on your lower back or abdomen. Do not  sleep with the heating pad. °· Use aerobic exercises, walking, swimming, biking, and other exercises to help lessen the cramping. °· Massage to the lower back or abdomen may help. °· Stop smoking. °· Avoid alcohol and caffeine. °Contact a health care provider if: °· Your pain does not get better with medicine. °· You have pain with sexual intercourse. °· Your pain increases and is not controlled with medicines. °· You have abnormal vaginal bleeding with your period. °· You develop nausea or vomiting with your period that is not controlled with medicine. °Get help right away if: °You pass out. °This information is not intended to replace advice given to you by your health care provider. Make sure you discuss any questions you have with your health care provider. °Document Released: 09/03/2005 Document Revised: 02/09/2016 Document Reviewed: 02/19/2013 °Elsevier Interactive Patient Education © 2017 [employer] ° °

## 2018-05-04 NOTE — MAU Provider Note (Signed)
History     CSN: [id number]  Arrival date and time: 05/04/18 1016   First Provider Initiated Contact with Patient 05/04/18 1116      Chief Complaint  Patient presents with  . Abdominal Pain  . Back Pain  . Vaginal Bleeding  . Vaginal Discharge   HPI Tarsha Aldredge is a 22 y.o. G1P0010 non pregnant female who presents with lower abdominal pain. She states the pain started on Tuesday and got worse when her period started yesterday. She rates the pain a 7/10 and has not tried anything for the pain. She reports a history of irregular heavy periods, has a nexplanon in place since 2016. She also reports a discharge with an odor last week.   She has been seen multiple times in MAU and the ER for similar complaints. She states she has out of state insurance and is unable to follow up in the clinic for financial reasons.   OB History    Gravida  1   Para  0   Term  0   Preterm  0   AB  1   Living  0     SAB  1   TAB  0   Ectopic  0   Multiple  0   Live Births  0           Past Medical History:  Diagnosis Date  . Anxiety    not taking meds  . Bilateral ovarian cysts   . Diabetes mellitus without complication (HCC)    prediabetic  . GI bleed due to NSAIDs   . Myocardial infarction (HCC)    2017   . Pseudoseizures   . Seizures (HCC)    pseudoseizures    Past Surgical History:  Procedure Laterality Date  . ESOPHAGOGASTRODUODENOSCOPY N/A 07/07/2016   Procedure: ESOPHAGOGASTRODUODENOSCOPY (EGD);  Surgeon: Ruffin Frederick, MD;  Location: [facility] ENDOSCOPY;  Service: Gastroenterology;  Laterality: N/A;  . HEMORROIDECTOMY      Family History  Problem Relation Age of Onset  . Hyperlipidemia Mother   . Mental illness Mother   . Hyperlipidemia Father   . Mental illness Brother   . Heart disease Paternal Grandfather   . Hyperlipidemia Paternal Grandfather   . Hypertension Paternal Grandfather   . CAD Other   . Cancer Other     Social History    Tobacco Use  . Smoking status: Current Some Day Smoker    Packs/day: 0.25    Types: Cigars  . Smokeless tobacco: Never Used  Substance Use Topics  . Alcohol use: Yes    Comment: social  . Drug use: Yes    Types: Marijuana    Comment: yesterday at 1500    Allergies:  Allergies  Allergen Reactions  . Bentyl [Dicyclomine Hcl] Cough    Coughs up blood  . Haldol [Haloperidol Lactate] Other (See Comments)    Psychotic episode  . Tramadol Hives and Itching  . Latex Itching, Swelling and Rash    Reaction to condoms    Medications Prior to Admission  Medication Sig Dispense Refill Last Dose  . etonogestrel (NEXPLANON) 68 MG IMPL implant 1 each by Subdermal route continuous.   03/22/2018 at Unknown time  . naproxen (NAPROSYN) 500 MG tablet Take 1 tablet (500 mg total) by mouth 2 (two) times daily with a meal. Take 1 tablet by mouth 2 times per day while on your period. (Patient not taking: Reported on 03/22/2018) 15 tablet 3 Not Taking at Unknown time  .  naproxen (NAPROSYN) 500 MG tablet Take 1 tablet (500 mg total) by mouth 2 (two) times daily. 30 tablet 0   . omeprazole (PRILOSEC) 40 MG capsule Take 40 mg by mouth 2 (two) times daily as needed (for reflux).   1 03/08/2018 at prn  . ondansetron (ZOFRAN) 4 MG tablet Take 1 tablet (4 mg total) by mouth every 6 (six) hours. 12 tablet 0   . polyethylene glycol (MIRALAX / GLYCOLAX) packet Take 17 g by mouth daily as needed for mild constipation.   03/21/2018 at prn  . promethazine (PHENERGAN) 25 MG tablet Take 1 tablet (25 mg total) by mouth every 6 (six) hours as needed for nausea or vomiting. (Patient not taking: Reported on 03/22/2018) 30 tablet 0 Not Taking at Unknown time    Review of Systems  Constitutional: Negative.  Negative for fatigue and fever.  HENT: Negative.   Respiratory: Negative.  Negative for shortness of breath.   Cardiovascular: Negative.  Negative for chest pain.  Gastrointestinal: Positive for abdominal pain. Negative for  constipation, diarrhea, nausea and vomiting.  Genitourinary: Positive for vaginal bleeding and vaginal discharge. Negative for dysuria.  Neurological: Negative.  Negative for dizziness and headaches.   Physical Exam   Blood pressure 135/89, pulse 99, temperature 97.8 F (36.6 C), temperature source Oral, resp. rate 19, weight 117 kg, SpO2 98 %.  Physical Exam  Nursing note and vitals reviewed. Constitutional: She is oriented to person, place, and time. She appears well-developed and well-nourished. No distress.  HENT:  Head: Normocephalic.  Eyes: Pupils are equal, round, and reactive to light.  Cardiovascular: Normal rate, regular rhythm and normal heart sounds.  Respiratory: Effort normal and breath sounds normal. No respiratory distress.  GI: Soft. Bowel sounds are normal. She exhibits no distension. There is no tenderness.  Genitourinary: No vaginal discharge found.  Genitourinary Comments: Small amount of bright red vaginal bleeding in vault. No CMT, adnexal mass or tenderness. Uterus nonenlarged, non tender.  Neurological: She is alert and oriented to person, place, and time.  Skin: Skin is warm and dry.  Psychiatric: She has a normal mood and affect. Her behavior is normal. Judgment and thought content normal.    MAU Course  Procedures Results for orders placed or performed during the hospital encounter of 05/04/18 (from the past 24 hour(s))  Urinalysis, Routine w reflex microscopic     Status: Abnormal   Collection Time: 05/04/18 11:05 AM  Result Value Ref Range   Color, Urine YELLOW YELLOW   APPearance CLEAR CLEAR   Specific Gravity, Urine 1.017 1.005 - 1.030   pH 6.0 5.0 - 8.0   Glucose, UA NEGATIVE NEGATIVE mg/dL   Hgb urine dipstick LARGE (A) NEGATIVE   Bilirubin Urine NEGATIVE NEGATIVE   Ketones, ur NEGATIVE NEGATIVE mg/dL   Protein, ur NEGATIVE NEGATIVE mg/dL   Nitrite NEGATIVE NEGATIVE   Leukocytes, UA NEGATIVE NEGATIVE   RBC / HPF 0-5 0 - 5 RBC/hpf   WBC, UA  0-5 0 - 5 WBC/hpf   Bacteria, UA NONE SEEN NONE SEEN   Squamous Epithelial / LPF 0-5 0 - 5   Mucus PRESENT   Pregnancy, urine POC     Status: None   Collection Time: 05/04/18 11:09 AM  Result Value Ref Range   Preg Test, Ur NEGATIVE NEGATIVE   MDM UA, UPT Wet prep and gc/chlamydia CBC Toradol IM Zofran 8mg  ODT  Patient reports resolution of pain and nausea  Assessment and Plan   1. Dysmenorrhea    -  Discharge home in stable condition -Rx for ibuprofen sent to patient's pharmacy -Patient advised to follow-up with OB of choice for ongoing management of dysmenorrhea. List given, suggested health department or [facility] for sliding scale options -Patient may return to MAU as needed or if her condition were to change or worsen   Rolm BookbinderCaroline M Kyomi Hector CNM 05/04/2018, 11:16 AM

## 2018-05-05 ENCOUNTER — Encounter (HOSPITAL_COMMUNITY): Payer: Self-pay | Admitting: Emergency Medicine

## 2018-05-05 ENCOUNTER — Emergency Department (HOSPITAL_COMMUNITY): Payer: PRIVATE HEALTH INSURANCE

## 2018-05-05 ENCOUNTER — Emergency Department (HOSPITAL_COMMUNITY)
Admission: EM | Admit: 2018-05-05 | Discharge: 2018-05-05 | Disposition: A | Discharge status: Home / Self Care | Payer: PRIVATE HEALTH INSURANCE | Attending: Emergency Medicine | Admitting: Emergency Medicine

## 2018-05-05 ENCOUNTER — Other Ambulatory Visit: Payer: Self-pay

## 2018-05-05 DIAGNOSIS — R102 Pelvic and perineal pain: Secondary | ICD-10-CM

## 2018-05-05 DIAGNOSIS — R112 Nausea with vomiting, unspecified: Secondary | ICD-10-CM | POA: Diagnosis not present

## 2018-05-05 DIAGNOSIS — R1030 Lower abdominal pain, unspecified: Secondary | ICD-10-CM | POA: Diagnosis present

## 2018-05-05 DIAGNOSIS — Z202 Contact with and (suspected) exposure to infections with a predominantly sexual mode of transmission: Secondary | ICD-10-CM | POA: Diagnosis not present

## 2018-05-05 DIAGNOSIS — N939 Abnormal uterine and vaginal bleeding, unspecified: Secondary | ICD-10-CM | POA: Diagnosis not present

## 2018-05-05 LAB — CBC WITH DIFFERENTIAL/PLATELET
BASOS ABS: 0 10*3/uL (ref 0.0–0.1)
Basophils Relative: 0 %
EOS ABS: 0.1 10*3/uL (ref 0.0–0.7)
EOS PCT: 1 %
HCT: 37.9 % (ref 36.0–46.0)
Hemoglobin: 11.8 g/dL — ABNORMAL LOW (ref 12.0–15.0)
LYMPHS ABS: 2.8 10*3/uL (ref 0.7–4.0)
Lymphocytes Relative: 28 %
MCH: 23.9 pg — AB (ref 26.0–34.0)
MCHC: 31.1 g/dL (ref 30.0–36.0)
MCV: 76.9 fL — ABNORMAL LOW (ref 78.0–100.0)
MONO ABS: 1.3 10*3/uL — AB (ref 0.1–1.0)
Monocytes Relative: 13 %
Neutro Abs: 6 10*3/uL (ref 1.7–7.7)
Neutrophils Relative %: 58 %
PLATELETS: 273 10*3/uL (ref 150–400)
RBC: 4.93 MIL/uL (ref 3.87–5.11)
RDW: 15 % (ref 11.5–15.5)
WBC: 10.3 10*3/uL (ref 4.0–10.5)

## 2018-05-05 LAB — URINALYSIS, ROUTINE W REFLEX MICROSCOPIC
Bacteria, UA: NONE SEEN
Bilirubin Urine: NEGATIVE
GLUCOSE, UA: NEGATIVE mg/dL
Ketones, ur: NEGATIVE mg/dL
LEUKOCYTES UA: NEGATIVE
NITRITE: NEGATIVE
PH: 6 (ref 5.0–8.0)
Protein, ur: 30 mg/dL — AB
SPECIFIC GRAVITY, URINE: 1.01 (ref 1.005–1.030)

## 2018-05-05 LAB — TYPE AND SCREEN
ABO/RH(D): B POS
Antibody Screen: NEGATIVE

## 2018-05-05 LAB — URINALYSIS, MICROSCOPIC (REFLEX): RBC / HPF: >50 RBC/hpf (ref 0–5)

## 2018-05-05 LAB — WET PREP, GENITAL
Sperm: NONE SEEN
TRICH WET PREP: NONE SEEN
Yeast Wet Prep HPF POC: NONE SEEN

## 2018-05-05 LAB — GC/CHLAMYDIA PROBE AMP (~~LOC~~) NOT AT ARMC
CHLAMYDIA, DNA PROBE: NEGATIVE
Chlamydia: NEGATIVE
Neisseria Gonorrhea: NEGATIVE
Neisseria Gonorrhea: NEGATIVE

## 2018-05-05 LAB — LIPASE, BLOOD: Lipase: 44 U/L (ref 11–51)

## 2018-05-05 LAB — BASIC METABOLIC PANEL
ANION GAP: 8 (ref 5–15)
BUN: 13 mg/dL (ref 6–20)
CO2: 27 mmol/L (ref 22–32)
Calcium: 9 mg/dL (ref 8.9–10.3)
Chloride: 110 mmol/L (ref 98–111)
Creatinine, Ser: 0.83 mg/dL (ref 0.44–1.00)
GFR calc Af Amer: >60 mL/min (ref >60)
Glucose, Bld: 112 mg/dL — ABNORMAL HIGH (ref 70–99)
POTASSIUM: 4.1 mmol/L (ref 3.5–5.1)
Sodium: 145 mmol/L (ref 135–145)

## 2018-05-05 LAB — I-STAT BETA HCG BLOOD, ED (MC, WL, AP ONLY): I-stat hCG, quantitative: <5 m[IU]/mL (ref <5)

## 2018-05-05 MED ORDER — ONDANSETRON 8 MG PO TBDP: ORAL_TABLET | ORAL | 0 refills | Status: AC

## 2018-05-05 MED ORDER — HYDROMORPHONE HCL 1 MG/ML IJ SOLN
1.0000 mg | Freq: Once | INTRAMUSCULAR | Status: AC
  Administered 2018-05-05: 1 mg via INTRAVENOUS
  Filled 2018-05-05: qty 1

## 2018-05-05 MED ORDER — IOPAMIDOL (ISOVUE-300) INJECTION 61%
INTRAVENOUS | Status: AC
  Filled 2018-05-05: qty 100

## 2018-05-05 MED ORDER — IOPAMIDOL (ISOVUE-300) INJECTION 61%
100.0000 mL | Freq: Once | INTRAVENOUS | Status: AC | PRN
  Administered 2018-05-05: 100 mL via INTRAVENOUS

## 2018-05-05 NOTE — ED Triage Notes (Addendum)
Pt arriving with abdominal pain. Pt has hx of ovarian cysts and she believes she may have another one. Pt seen at MAU yesterday for same.  Prior to arrival pt received: 100mcg Fentanyl 4mg  Zofran

## 2018-05-05 NOTE — Discharge Instructions (Addendum)
1. Medications: zofran, usual home medications °2. Treatment: rest, drink plenty of fluids, advance diet slowly °3. Follow Up: Please followup with your primary doctor in 2 days for discussion of your diagnoses and further evaluation after today's visit; if you do not have a primary care doctor use the resource guide provided to find one; Please return to the ER for persistent vomiting, high fevers or worsening symptoms ° °

## 2018-05-05 NOTE — ED Provider Notes (Signed)
[facility]-EMERGENCY DEPT Provider Note   CSN: [id number] Arrival date & time: 05/05/18  0240     History   Chief Complaint Chief Complaint  Patient presents with  . Abdominal Pain    HPI Ashlee Thompson is a 22 y.o. female with a hx of anxiety, ovarian cyst, prediabetes, interstitial cystitis, irritable bowel disease, pseudoseizures presents to the Emergency Department complaining of gradual, persistent, progressively worsening lower quadrant abdominal pain onset approximately 1 week ago.  She reports that she has also had associated dysuria and flank pain.  She denies fevers or chills.  She reports that her pain became acutely worse this morning after a "popping" sensation in her left lower quadrant.  She reports after that she noticed some vaginal bleeding.  Per EMS, patient writhing in pain, diaphoretic and vomiting on their arrival.  Patient was given 100 mcg of fentanyl and Zofran in route to the hospital.  She denies previous pain similar to this in the past.  No aggravating or alleviating factors.  She denies fever, chills, headache, neck pain, chest pain, shortness of breath, vaginal discharge.  Patient reports her last menstrual cycle was in June.  She reports long and irregular menstrual cycles.  She uses Nexplanon for birth control.  She reports 3 female sexual partners in the last 6 months.  The history is provided by the patient, medical records and the EMS personnel. No language interpreter was used.    Past Medical History:  Diagnosis Date  . Anxiety    not taking meds  . Bilateral ovarian cysts   . Diabetes mellitus without complication (HCC)    prediabetic  . GI bleed due to NSAIDs   . Myocardial infarction (HCC)    2017   . Pseudoseizures   . Seizures (HCC)    pseudoseizures    Patient Active Problem List   Diagnosis Date Noted  . Headache   . UGI bleed 07/07/2016  . History of diabetes mellitus 07/07/2016  . Morbid obesity (HCC)  07/07/2016  . Hematemesis with nausea 07/07/2016  . GI bleed 07/07/2016    Past Surgical History:  Procedure Laterality Date  . ESOPHAGOGASTRODUODENOSCOPY N/A 07/07/2016   Procedure: ESOPHAGOGASTRODUODENOSCOPY (EGD);  Surgeon: Ruffin Frederick, MD;  Location: [facility] ENDOSCOPY;  Service: Gastroenterology;  Laterality: N/A;  . HEMORROIDECTOMY       OB History    Gravida  1   Para  0   Term  0   Preterm  0   AB  1   Living  0     SAB  1   TAB  0   Ectopic  0   Multiple  0   Live Births  0            Home Medications    Prior to Admission medications   Medication Sig Start Date End Date Taking? Authorizing Provider  etonogestrel (NEXPLANON) 68 MG IMPL implant 1 each by Subdermal route continuous.   Yes [provider]  hydrocortisone (ANUSOL-HC) 2.5 % rectal cream Place 1 application rectally 3 (three) times daily as needed for hemorrhoids or anal itching.   Yes [provider]  linaclotide (LINZESS) 72 MCG capsule Take 72 mcg by mouth daily before breakfast.   Yes [provider]  naproxen (NAPROSYN) 500 MG tablet Take 500 mg by mouth 2 (two) times daily as needed for moderate pain.   Yes [provider]  polyethylene glycol (MIRALAX / GLYCOLAX) packet Take 17 g by mouth  daily as needed for mild constipation.   Yes [provider]  ibuprofen (ADVIL,MOTRIN) 800 MG tablet Take 1 tablet (800 mg total) by mouth 3 (three) times daily. 05/04/18   Rolm Bookbinder, CNM  omeprazole (PRILOSEC) 40 MG capsule Take 40 mg by mouth 2 (two) times daily as needed (for reflux).  06/08/17   [provider]  ondansetron (ZOFRAN ODT) 8 MG disintegrating tablet 8mg  ODT q4 hours prn nausea 05/05/18   Lace Chenevert, Dahlia Client, PA-C    Family History Family History  Problem Relation Age of Onset  . Hyperlipidemia Mother   . Mental illness Mother   . Hyperlipidemia Father   . Mental illness Brother   . Heart disease Paternal  Grandfather   . Hyperlipidemia Paternal Grandfather   . Hypertension Paternal Grandfather   . CAD Other   . Cancer Other     Social History Social History   Tobacco Use  . Smoking status: Current Some Day Smoker    Packs/day: 0.25    Types: Cigars  . Smokeless tobacco: Never Used  Substance Use Topics  . Alcohol use: Yes    Comment: social  . Drug use: Yes    Types: Marijuana    Comment: yesterday at 1500     Allergies   Bentyl [dicyclomine hcl]; Haldol [haloperidol lactate]; Tramadol; and Latex   Review of Systems Review of Systems  Constitutional: Negative for appetite change, diaphoresis, fatigue, fever and unexpected weight change.  HENT: Negative for mouth sores.   Eyes: Negative for visual disturbance.  Respiratory: Negative for cough, chest tightness, shortness of breath and wheezing.   Cardiovascular: Negative for chest pain.  Gastrointestinal: Positive for abdominal pain and vomiting. Negative for constipation, diarrhea and nausea.  Endocrine: Negative for polydipsia, polyphagia and polyuria.  Genitourinary: Positive for dysuria, flank pain and vaginal bleeding. Negative for frequency, hematuria and urgency.  Musculoskeletal: Negative for back pain and neck stiffness.  Skin: Negative for rash.  Allergic/Immunologic: Negative for immunocompromised state.  Neurological: Negative for syncope, light-headedness and headaches.  Hematological: Does not bruise/bleed easily.  Psychiatric/Behavioral: Negative for sleep disturbance. The patient is not nervous/anxious.      Physical Exam Updated Vital Signs BP (!) 135/93   Pulse 79   Temp 97.8 F (36.6 C) (Oral)   Resp 16   SpO2 100%   Physical Exam  Constitutional: She appears well-developed and well-nourished. No distress.  Awake, alert, nontoxic appearance  HENT:  Head: Normocephalic and atraumatic.  Mouth/Throat: Oropharynx is clear and moist. No oropharyngeal exudate.  Eyes: Conjunctivae are normal. No  scleral icterus.  Neck: Normal range of motion. Neck supple.  Cardiovascular: Normal rate, regular rhythm, normal heart sounds and intact distal pulses.  No murmur heard. Pulmonary/Chest: Effort normal and breath sounds normal. No respiratory distress. She has no wheezes.  Equal chest expansion  Abdominal: Soft. Bowel sounds are normal. She exhibits no mass. There is tenderness in the left lower quadrant. There is guarding. There is no rigidity, no rebound and no CVA tenderness. Hernia confirmed negative in the right inguinal area and confirmed negative in the left inguinal area.  Exam limited by body habitus  Genitourinary: Uterus normal. No labial fusion. There is no rash, tenderness or lesion on the right labia. There is no rash, tenderness or lesion on the left labia. Uterus is not deviated, not enlarged, not fixed and not tender. Cervix exhibits no motion tenderness, no discharge and no friability. Right adnexum displays no mass, no tenderness and no fullness.  Left adnexum displays tenderness. Left adnexum displays no mass and no fullness. There is bleeding in the vagina. No erythema or tenderness in the vagina. No foreign body in the vagina. No signs of injury around the vagina. No vaginal discharge found.  Genitourinary Comments: Moderate amount of dark blood in the vaginal vault coming from the cervical os Exquisite tenderness of the left adnexa  Musculoskeletal: Normal range of motion. She exhibits no edema.  Lymphadenopathy:       Right: No inguinal adenopathy present.       Left: No inguinal adenopathy present.  Neurological: She is alert.  Speech is clear and goal oriented Moves extremities without ataxia  Skin: Skin is warm and dry. She is not diaphoretic. No erythema.  Psychiatric: She has a normal mood and affect.  Nursing note and vitals reviewed.    ED Treatments / Results  Labs (all labs ordered are listed, but only abnormal results are displayed) Labs Reviewed  WET  PREP, GENITAL - Abnormal; Notable for the following components:      Result Value   Clue Cells Wet Prep HPF POC PRESENT (*)    WBC, Wet Prep HPF POC MANY (*)    All other components within normal limits  CBC WITH DIFFERENTIAL/PLATELET - Abnormal; Notable for the following components:   Hemoglobin 11.8 (*)    MCV 76.9 (*)    MCH 23.9 (*)    Monocytes Absolute 1.3 (*)    All other components within normal limits  BASIC METABOLIC PANEL - Abnormal; Notable for the following components:   Glucose, Bld 112 (*)    All other components within normal limits  URINALYSIS, ROUTINE W REFLEX MICROSCOPIC - Abnormal; Notable for the following components:   Color, Urine RED (*)    APPearance CLOUDY (*)    Glucose, UA   (*)    Value: TEST NOT REPORTED DUE TO COLOR INTERFERENCE OF URINE PIGMENT   Hgb urine dipstick   (*)    Value: TEST NOT REPORTED DUE TO COLOR INTERFERENCE OF URINE PIGMENT   Bilirubin Urine   (*)    Value: TEST NOT REPORTED DUE TO COLOR INTERFERENCE OF URINE PIGMENT   Ketones, ur   (*)    Value: TEST NOT REPORTED DUE TO COLOR INTERFERENCE OF URINE PIGMENT   Protein, ur   (*)    Value: TEST NOT REPORTED DUE TO COLOR INTERFERENCE OF URINE PIGMENT   Nitrite   (*)    Value: TEST NOT REPORTED DUE TO COLOR INTERFERENCE OF URINE PIGMENT   Leukocytes, UA   (*)    Value: TEST NOT REPORTED DUE TO COLOR INTERFERENCE OF URINE PIGMENT   All other components within normal limits  URINALYSIS, MICROSCOPIC (REFLEX) - Abnormal; Notable for the following components:   Bacteria, UA RARE (*)    All other components within normal limits  URINALYSIS, ROUTINE W REFLEX MICROSCOPIC - Abnormal; Notable for the following components:   Color, Urine STRAW (*)    Hgb urine dipstick SMALL (*)    Protein, ur 30 (*)    All other components within normal limits  LIPASE, BLOOD  I-STAT BETA HCG BLOOD, ED ([facility], [facility], [facility] ONLY)  TYPE AND SCREEN  GC/CHLAMYDIA PROBE AMP ([place]) NOT AT [facility]    Radiology [place]  Transvaginal Non-ob  Result Date: 05/05/2018 CLINICAL DATA:  Initial evaluation for acute left lower quadrant pain, vaginal bleeding. EXAM: TRANSABDOMINAL AND TRANSVAGINAL ULTRASOUND OF PELVIS DOPPLER ULTRASOUND OF OVARIES TECHNIQUE: Both transabdominal and transvaginal  ultrasound examinations of the pelvis were performed. Transabdominal technique was performed for global imaging of the pelvis including uterus, ovaries, adnexal regions, and pelvic cul-de-sac. It was necessary to proceed with endovaginal exam following the transabdominal exam to visualize the uterus, endometrium, and ovaries. Color and duplex Doppler ultrasound was utilized to evaluate blood flow to the ovaries. COMPARISON:  Prior ultrasound from 08/03/2017. FINDINGS: Uterus Measurements: 5.7 x 3.4 x 4.6 cm. No fibroids or other mass visualized. Endometrium Thickness: 5.0 mm.  No focal abnormality visualized. Right ovary Measurements: 2.6 x 1.4 x 1.9 cm. Normal appearance/no adnexal mass. Left ovary Measurements: 3.1 x 1.7 x 1.9 cm. Normal appearance/no adnexal mass. Pulsed Doppler evaluation of both ovaries demonstrates normal low-resistance arterial and venous waveforms. Other findings No abnormal free fluid. IMPRESSION: Normal pelvic ultrasound. No evidence for torsion or other acute abnormality. Electronically Signed   By: Rise Mu M.D.   On: 05/05/2018 06:08   US Pelvis Complete  Result Date: 05/05/2018 CLINICAL DATA:  Initial evaluation for acute left lower quadrant pain, vaginal bleeding. EXAM: TRANSABDOMINAL AND TRANSVAGINAL ULTRASOUND OF PELVIS DOPPLER ULTRASOUND OF OVARIES TECHNIQUE: Both transabdominal and transvaginal ultrasound examinations of the pelvis were performed. Transabdominal technique was performed for global imaging of the pelvis including uterus, ovaries, adnexal regions, and pelvic cul-de-sac. It was necessary to proceed with endovaginal exam following the transabdominal exam to visualize the uterus,  endometrium, and ovaries. Color and duplex Doppler ultrasound was utilized to evaluate blood flow to the ovaries. COMPARISON:  Prior ultrasound from 08/03/2017. FINDINGS: Uterus Measurements: 5.7 x 3.4 x 4.6 cm. No fibroids or other mass visualized. Endometrium Thickness: 5.0 mm.  No focal abnormality visualized. Right ovary Measurements: 2.6 x 1.4 x 1.9 cm. Normal appearance/no adnexal mass. Left ovary Measurements: 3.1 x 1.7 x 1.9 cm. Normal appearance/no adnexal mass. Pulsed Doppler evaluation of both ovaries demonstrates normal low-resistance arterial and venous waveforms. Other findings No abnormal free fluid. IMPRESSION: Normal pelvic ultrasound. No evidence for torsion or other acute abnormality. Electronically Signed   By: Rise Mu M.D.   On: 05/05/2018 06:08   Ct Abdomen Pelvis W Contrast  Result Date: 05/05/2018 CLINICAL DATA:  Left lower quadrant abdominal pain. History of ovarian cysts. Seen yesterday at [facility] for same. EXAM: CT ABDOMEN AND PELVIS WITH CONTRAST TECHNIQUE: Multidetector CT imaging of the abdomen and pelvis was performed using the standard protocol following bolus administration of intravenous contrast. CONTRAST:  ISOVUE-300 IOPAMIDOL (ISOVUE-300) INJECTION 61% COMPARISON:  Ultrasound pelvis 05/05/2018. CT abdomen and pelvis 09/17/2017 FINDINGS: Lower chest: Lung bases are clear. Hepatobiliary: No focal liver abnormality is seen. No gallstones, gallbladder wall thickening, or biliary dilatation. Pancreas: Unremarkable. No pancreatic ductal dilatation or surrounding inflammatory changes. Spleen: Normal in size without focal abnormality. Adrenals/Urinary Tract: Adrenal glands are unremarkable. Kidneys are normal, without renal calculi, focal lesion, or hydronephrosis. Bladder is unremarkable. Stomach/Bowel: Stomach is within normal limits. Appendix appears normal. No evidence of bowel wall thickening, distention, or inflammatory changes. Vascular/Lymphatic: No significant  vascular findings are present. No enlarged abdominal or pelvic lymph nodes. Reproductive: Uterus and bilateral adnexa are unremarkable. Other: No abdominal wall hernia or abnormality. No abdominopelvic ascites. Musculoskeletal: No acute or significant osseous findings. IMPRESSION: No significant abnormality. Electronically Signed   By: Burman Nieves M.D.   On: 05/05/2018 06:23   [place] Art/ven Flow Abd Pelv Doppler  Result Date: 05/05/2018 CLINICAL DATA:  Initial evaluation for acute left lower quadrant pain, vaginal bleeding. EXAM: TRANSABDOMINAL AND TRANSVAGINAL ULTRASOUND OF PELVIS DOPPLER ULTRASOUND  OF OVARIES TECHNIQUE: Both transabdominal and transvaginal ultrasound examinations of the pelvis were performed. Transabdominal technique was performed for global imaging of the pelvis including uterus, ovaries, adnexal regions, and pelvic cul-de-sac. It was necessary to proceed with endovaginal exam following the transabdominal exam to visualize the uterus, endometrium, and ovaries. Color and duplex Doppler ultrasound was utilized to evaluate blood flow to the ovaries. COMPARISON:  Prior ultrasound from 08/03/2017. FINDINGS: Uterus Measurements: 5.7 x 3.4 x 4.6 cm. No fibroids or other mass visualized. Endometrium Thickness: 5.0 mm.  No focal abnormality visualized. Right ovary Measurements: 2.6 x 1.4 x 1.9 cm. Normal appearance/no adnexal mass. Left ovary Measurements: 3.1 x 1.7 x 1.9 cm. Normal appearance/no adnexal mass. Pulsed Doppler evaluation of both ovaries demonstrates normal low-resistance arterial and venous waveforms. Other findings No abnormal free fluid. IMPRESSION: Normal pelvic ultrasound. No evidence for torsion or other acute abnormality. Electronically Signed   By: Rise MuBenjamin  McClintock M.D.   On: 05/05/2018 06:08    Procedures Procedures (including critical care time)  Medications Ordered in ED Medications  iopamidol (ISOVUE-300) 61 % injection (has no administration in time range)    HYDROmorphone (DILAUDID) injection 1 mg (1 mg Intravenous Given 05/05/18 0326)  HYDROmorphone (DILAUDID) injection 1 mg (1 mg Intravenous Given 05/05/18 0534)  iopamidol (ISOVUE-300) 61 % injection 100 mL (100 mLs Intravenous Contrast Given 05/05/18 0600)     Initial Impression / Assessment and Plan / ED Course  I have reviewed the triage vital signs and the nursing notes.  Pertinent labs & imaging results that were available during my care of the patient were reviewed by me and considered in my medical decision making (see chart for details).  Clinical Course as of May 05 632  [facility]  [id number] Repeat UA without evidence of urinary tract infection  WBC, UA: 0-5 [HM]  0555 No leukocytosis  WBC: 10.3 [HM]  0555 Mild anemia, not far from baseline  Hemoglobin(!): 11.8 [HM]  0555 Normal  Lipase: 44 [HM]  0556 Patient currently menstruating.  No infectious appearing vaginal discharge.  WBC, Wet Prep HPF POC(!): MANY [HM]    Clinical Course User Index [HM] Dorin Stooksbury, Dahlia ClientHannah, PA-C    Severe left lower quadrant abdominal pain and vaginal bleeding.  She had difficult control pain here in the emergency department.  Record review shows patient has been seen numerous times for this over the last year with CT scans and ultrasounds.  Labs today are reassuring.  Very mild anemia but no leukocytosis.  Patient is afebrile without tachycardia or hypotension.  No evidence of sepsis.  Vaginal ultrasound shows no evidence of ovarian torsion or ovarian cyst.  No free fluid in the pelvis.  CT scan is without evidence of acute abnormality including diverticulitis, cholecystitis or ruptured hemorrhagic cyst.  No evidence of TOA.  At this time there is no evidence of emergent medical condition.  Will be discharged home and referred to OB/GYN for her recurrent pelvic pain.  Final Clinical Impressions(s) / ED Diagnoses   Final diagnoses:  Pelvic pain in female  Vaginal bleeding  Non-intractable  vomiting with nausea, unspecified vomiting type    ED Discharge Orders         Ordered    ondansetron (ZOFRAN ODT) 8 MG disintegrating tablet     05/05/18 0632           Kurt Azimi, Dahlia ClientHannah, PA-C 05/05/18 [id number]    Zadie RhineWickline, Donald, MD 05/06/18 [phone]

## 2018-05-05 NOTE — ED Notes (Signed)
Bed: [id number] Expected date:  Expected time:  Means of arrival:  Comments: EMS 22 yo female abdominal pain/hx ovarian cysts-diaphoretic-fentanyl

## 2018-05-15 ENCOUNTER — Other Ambulatory Visit: Payer: Self-pay

## 2018-05-15 ENCOUNTER — Encounter (HOSPITAL_COMMUNITY): Payer: Self-pay

## 2018-05-15 ENCOUNTER — Ambulatory Visit (HOSPITAL_COMMUNITY)
Admission: EM | Admit: 2018-05-15 | Discharge: 2018-05-15 | Disposition: A | Discharge status: Home / Self Care | Payer: PRIVATE HEALTH INSURANCE | Source: Home / Self Care

## 2018-05-15 ENCOUNTER — Emergency Department (HOSPITAL_COMMUNITY)
Admission: EM | Admit: 2018-05-15 | Discharge: 2018-05-15 | Disposition: A | Discharge status: Home / Self Care | Payer: PRIVATE HEALTH INSURANCE | Attending: Emergency Medicine | Admitting: Emergency Medicine

## 2018-05-15 DIAGNOSIS — F17290 Nicotine dependence, other tobacco product, uncomplicated: Secondary | ICD-10-CM | POA: Insufficient documentation

## 2018-05-15 DIAGNOSIS — I252 Old myocardial infarction: Secondary | ICD-10-CM | POA: Diagnosis not present

## 2018-05-15 DIAGNOSIS — Z79899 Other long term (current) drug therapy: Secondary | ICD-10-CM | POA: Insufficient documentation

## 2018-05-15 DIAGNOSIS — R1010 Upper abdominal pain, unspecified: Secondary | ICD-10-CM | POA: Diagnosis present

## 2018-05-15 DIAGNOSIS — R45851 Suicidal ideations: Secondary | ICD-10-CM | POA: Insufficient documentation

## 2018-05-15 DIAGNOSIS — E119 Type 2 diabetes mellitus without complications: Secondary | ICD-10-CM | POA: Insufficient documentation

## 2018-05-15 DIAGNOSIS — F419 Anxiety disorder, unspecified: Secondary | ICD-10-CM

## 2018-05-15 DIAGNOSIS — R112 Nausea with vomiting, unspecified: Secondary | ICD-10-CM | POA: Insufficient documentation

## 2018-05-15 DIAGNOSIS — Z91040 Latex allergy status: Secondary | ICD-10-CM | POA: Diagnosis not present

## 2018-05-15 DIAGNOSIS — R1013 Epigastric pain: Secondary | ICD-10-CM | POA: Diagnosis not present

## 2018-05-15 DIAGNOSIS — F332 Major depressive disorder, recurrent severe without psychotic features: Secondary | ICD-10-CM | POA: Insufficient documentation

## 2018-05-15 LAB — URINALYSIS, ROUTINE W REFLEX MICROSCOPIC
Bilirubin Urine: NEGATIVE
GLUCOSE, UA: NEGATIVE mg/dL
Hgb urine dipstick: NEGATIVE
Ketones, ur: NEGATIVE mg/dL
LEUKOCYTES UA: NEGATIVE
Nitrite: NEGATIVE
PH: 7 (ref 5.0–8.0)
PROTEIN: NEGATIVE mg/dL
SPECIFIC GRAVITY, URINE: 1.012 (ref 1.005–1.030)

## 2018-05-15 LAB — CBC
HEMATOCRIT: 42.3 % (ref 36.0–46.0)
HEMOGLOBIN: 12.8 g/dL (ref 12.0–15.0)
MCH: 23.7 pg — ABNORMAL LOW (ref 26.0–34.0)
MCHC: 30.3 g/dL (ref 30.0–36.0)
MCV: 78.2 fL (ref 78.0–100.0)
Platelets: 318 10*3/uL (ref 150–400)
RBC: 5.41 MIL/uL — AB (ref 3.87–5.11)
RDW: 14.5 % (ref 11.5–15.5)
WBC: 8.3 10*3/uL (ref 4.0–10.5)

## 2018-05-15 LAB — COMPREHENSIVE METABOLIC PANEL
ALK PHOS: 86 U/L (ref 38–126)
ALT: 15 U/L (ref 0–44)
AST: 17 U/L (ref 15–41)
Albumin: 3.8 g/dL (ref 3.5–5.0)
Anion gap: 8 (ref 5–15)
BUN: 8 mg/dL (ref 6–20)
CHLORIDE: 104 mmol/L (ref 98–111)
CO2: 28 mmol/L (ref 22–32)
Calcium: 9.3 mg/dL (ref 8.9–10.3)
Creatinine, Ser: 0.77 mg/dL (ref 0.44–1.00)
Glucose, Bld: 89 mg/dL (ref 70–99)
POTASSIUM: 4.1 mmol/L (ref 3.5–5.1)
Sodium: 140 mmol/L (ref 135–145)
Total Bilirubin: 0.7 mg/dL (ref 0.3–1.2)
Total Protein: 7.3 g/dL (ref 6.5–8.1)

## 2018-05-15 LAB — I-STAT BETA HCG BLOOD, ED (MC, WL, AP ONLY): I-stat hCG, quantitative: <5 m[IU]/mL (ref <5)

## 2018-05-15 LAB — ACETAMINOPHEN LEVEL: Acetaminophen (Tylenol), Serum: <10 ug/mL — ABNORMAL LOW (ref 10–30)

## 2018-05-15 LAB — SALICYLATE LEVEL: Salicylate Lvl: <7 mg/dL (ref 2.8–30.0)

## 2018-05-15 LAB — LIPASE, BLOOD: LIPASE: 40 U/L (ref 11–51)

## 2018-05-15 MED ORDER — ONDANSETRON 4 MG PO TBDP
4.0000 mg | ORAL_TABLET | Freq: Once | ORAL | Status: AC | PRN
  Administered 2018-05-15: 4 mg via ORAL
  Filled 2018-05-15: qty 1

## 2018-05-15 MED ORDER — RANITIDINE HCL 150 MG PO TABS: 150.0000 mg | ORAL_TABLET | Freq: Two times a day (BID) | ORAL | 0 refills | Status: AC

## 2018-05-15 MED ORDER — RANITIDINE HCL 150 MG/10ML PO SYRP
150.0000 mg | ORAL_SOLUTION | Freq: Once | ORAL | Status: AC
  Administered 2018-05-15: 150 mg via ORAL
  Filled 2018-05-15: qty 10

## 2018-05-15 MED ORDER — GI COCKTAIL ~~LOC~~
30.0000 mL | Freq: Once | ORAL | Status: AC
  Administered 2018-05-15: 30 mL via ORAL
  Filled 2018-05-15: qty 30

## 2018-05-15 MED ORDER — SUCRALFATE 1 G PO TABS
1.0000 g | ORAL_TABLET | Freq: Once | ORAL | Status: AC
  Administered 2018-05-15: 1 g via ORAL
  Filled 2018-05-15: qty 1

## 2018-05-15 MED ORDER — HYDROXYZINE HCL 25 MG PO TABS
25.0000 mg | ORAL_TABLET | Freq: Once | ORAL | Status: AC
  Administered 2018-05-15: 25 mg via ORAL
  Filled 2018-05-15: qty 1

## 2018-05-15 MED ORDER — SUCRALFATE 1 G PO TABS: 1.0000 g | ORAL_TABLET | Freq: Three times a day (TID) | ORAL | 0 refills | Status: AC

## 2018-05-15 NOTE — Consult Note (Signed)
   5:50 - 6:12 PM Attempted to reassess patient via tele assessment for disposition.  Spoke with Maralyn SagoSarah, RN 3 times to get tele machine set up.  Called machine several time and it is not picking up.  Will attempt assessment at later time.    Shuvon B. Rankin, NP

## 2018-05-15 NOTE — ED Notes (Signed)
TTS at bedside. 

## 2018-05-15 NOTE — ED Notes (Signed)
Patient Alert and oriented to baseline. Stable and ambulatory to baseline. Patient verbalized understanding of the discharge instructions.  Patient belongings were taken by the patient.   

## 2018-05-15 NOTE — ED Triage Notes (Signed)
Pt presents with epigastric pain x 3-4 days with onset of nausea and vomiting; denies any diarrhea but reports black stool.

## 2018-05-15 NOTE — ED Provider Notes (Addendum)
[facility] EMERGENCY DEPARTMENT Provider Note   CSN: [id number] Arrival date & time: 05/15/18  1152     History   Chief Complaint Chief Complaint  Patient presents with  . Abdominal Pain    HPI Ashlee Thompson is a 22 y.o. female with history of anxiety, bilateral ovarian cyst, diabetes mellitus, GI bleed due to NSAIDs, MI, and pseudoseizures presents for evaluation of acute onset, constant upper abdominal pain for 5 days.  She states that she had her menstrual cycle last week as expected which lasted around 5 days.  She was taking 800 mg of ibuprofen 3-4 times daily for her period as she usually does.  She typically has lower abdominal pain associated with her menstrual cycle and this pain resolved on Saturday.  On Sunday 5 days ago she developed pressure-like pins and needle sensation to the epigastric region and left upper quadrant.  The pain radiates bilaterally.  She also notes excessive belching.  Nausea and vomiting began today and she notes that she has had 4 episodes of nonbilious emesis with some read streaking.  Improvement in nausea with Zofran.  She feels worsening pressure-like sensation to the abdomen when she lays flat.  She also feels as though her abdomen is "bloated".  Denies fevers but notes subjective chills.  Notes that her stools have been loose for the past few days but she thinks this is because she took MiraLAX because she thought she was constipated.  She notes she had one bowel movement several days ago that was dark in color but not bloody and has not had dark stool since then.  This morning was normal for her.  Denies melena or hematochezia.  She has tried cutting out dairy which has been minimally helpful.  She does have a gastroenterologist.  She is also in the process of being set up for a procedure for her endometriosis.  The history is provided by the patient.    Past Medical History:  Diagnosis Date  . Anxiety    not taking meds  .  Bilateral ovarian cysts   . Diabetes mellitus without complication (HCC)    prediabetic  . GI bleed due to NSAIDs   . Myocardial infarction (HCC)    2017   . Pseudoseizures   . Seizures (HCC)    pseudoseizures    Patient Active Problem List   Diagnosis Date Noted  . Headache   . UGI bleed 07/07/2016  . History of diabetes mellitus 07/07/2016  . Morbid obesity (HCC) 07/07/2016  . Hematemesis with nausea 07/07/2016  . GI bleed 07/07/2016    Past Surgical History:  Procedure Laterality Date  . ESOPHAGOGASTRODUODENOSCOPY N/A 07/07/2016   Procedure: ESOPHAGOGASTRODUODENOSCOPY (EGD);  Surgeon: Ruffin Frederick, MD;  Location: [facility] ENDOSCOPY;  Service: Gastroenterology;  Laterality: N/A;  . HEMORROIDECTOMY       OB History    Gravida  1   Para  0   Term  0   Preterm  0   AB  1   Living  0     SAB  1   TAB  0   Ectopic  0   Multiple  0   Live Births  0            Home Medications    Prior to Admission medications   Medication Sig Start Date End Date Taking? Authorizing Provider  diphenhydrAMINE (BENADRYL) 25 MG tablet Take 25 mg by mouth every 6 (six) hours as needed for allergies (  bee stings).   Yes [provider]  etonogestrel (NEXPLANON) 68 MG IMPL implant 1 each by Subdermal route continuous. Placed 05/2015   Yes [provider]  hydrocortisone (ANUSOL-HC) 2.5 % rectal cream Place 1 application rectally 3 (three) times daily as needed for hemorrhoids or anal itching.   Yes [provider]  linaclotide (LINZESS) 72 MCG capsule Take 72 mcg by mouth daily before breakfast.   Yes [provider]  omeprazole (PRILOSEC) 40 MG capsule Take 40 mg by mouth 2 (two) times daily as needed (for reflux).  06/08/17  Yes [provider]  polyethylene glycol (MIRALAX / GLYCOLAX) packet Take 17 g by mouth daily as needed for mild constipation.   Yes [provider]  ibuprofen (ADVIL,MOTRIN) 800 MG tablet Take 1  tablet (800 mg total) by mouth 3 (three) times daily. Patient not taking: Reported on 05/15/2018 05/04/18   Rolm BookbinderNeill, Caroline M, CNM  ondansetron [facility](ZOFRAN ODT) 8 MG disintegrating tablet 8mg  ODT q4 hours prn nausea Patient not taking: Reported on 05/15/2018 05/05/18   Muthersbaugh, Dahlia ClientHannah, PA-C  ranitidine (ZANTAC) 150 MG tablet Take 1 tablet (150 mg total) by mouth 2 (two) times daily. 05/15/18   Luevenia MaxinFawze, Ellington Greenslade A, PA-C  sucralfate (CARAFATE) 1 g tablet Take 1 tablet (1 g total) by mouth 4 (four) times daily -  with meals and at bedtime. 05/15/18   Jeanie SewerFawze, Pat Elicker A, PA-C    Family History Family History  Problem Relation Age of Onset  . Hyperlipidemia Mother   . Mental illness Mother   . Hyperlipidemia Father   . Mental illness Brother   . Heart disease Paternal Grandfather   . Hyperlipidemia Paternal Grandfather   . Hypertension Paternal Grandfather   . CAD Other   . Cancer Other     Social History Social History   Tobacco Use  . Smoking status: Current Some Day Smoker    Packs/day: 0.25    Types: Cigars  . Smokeless tobacco: Never Used  Substance Use Topics  . Alcohol use: Yes    Comment: social  . Drug use: Yes    Types: Marijuana    Comment: yesterday at 1500     Allergies   Bentyl [dicyclomine hcl]; Haldol [haloperidol lactate]; Tramadol; and Latex   Review of Systems Review of Systems  Constitutional: Positive for chills. Negative for fever.  Respiratory: Negative for shortness of breath.   Cardiovascular: Negative for chest pain.  Gastrointestinal: Positive for abdominal pain, nausea and vomiting.       +belching  All other systems reviewed and are negative.    Physical Exam Updated Vital Signs BP 131/78   Pulse 93   Temp 98.2 F (36.8 C) (Oral)   Resp (!) 22   LMP 05/09/2018 (Exact Date)   SpO2 100%   Physical Exam  Constitutional: She appears well-developed and well-nourished. No distress.  Obese female laying in bed in the right lateral decubitus position.   Frequent belching  HENT:  Head: Normocephalic and atraumatic.  Eyes: Conjunctivae are normal. Right eye exhibits no discharge. Left eye exhibits no discharge.  Neck: No JVD present. No tracheal deviation present.  Cardiovascular: Normal rate, regular rhythm and normal heart sounds.  Pulmonary/Chest: Effort normal and breath sounds normal.  Abdominal: Soft. Normal appearance. She exhibits no distension. Bowel sounds are increased. There is tenderness in the epigastric area and left upper quadrant. There is guarding. There is no rigidity, no rebound, no CVA tenderness, no tenderness at McBurney's point and negative  Murphy's sign.  Musculoskeletal: She exhibits no edema.  No midline spine TTP, no paraspinal muscle tenderness, no deformity, crepitus, or step-off noted   Neurological: She is alert.  Skin: Skin is warm and dry. No erythema.  Psychiatric: She has a normal mood and affect. Her behavior is normal.  Nursing note and vitals reviewed.    ED Treatments / Results  Labs (all labs ordered are listed, but only abnormal results are displayed) Labs Reviewed  CBC - Abnormal; Notable for the following components:      Result Value   RBC 5.41 (*)    MCH 23.7 (*)    All other components within normal limits  URINALYSIS, ROUTINE W REFLEX MICROSCOPIC - Abnormal; Notable for the following components:   APPearance HAZY (*)    All other components within normal limits  ACETAMINOPHEN LEVEL - Abnormal; Notable for the following components:   Acetaminophen (Tylenol), Serum <10 (*)    All other components within normal limits  LIPASE, BLOOD  COMPREHENSIVE METABOLIC PANEL  SALICYLATE LEVEL  RAPID URINE DRUG SCREEN, HOSP PERFORMED  I-STAT BETA HCG BLOOD, ED ([facility], [facility], [facility] ONLY)    EKG None  Radiology No results found.  Procedures Procedures (including critical care time)  Medications Ordered in ED Medications  ondansetron (ZOFRAN-ODT) disintegrating tablet 4 mg (4 mg Oral Given  05/15/18 1202)  gi cocktail (Maalox,Lidocaine,Donnatal) (30 mLs Oral Given 05/15/18 1457)  ranitidine (ZANTAC) 150 MG/10ML syrup 150 mg (150 mg Oral Given 05/15/18 1457)  sucralfate (CARAFATE) tablet 1 g (1 g Oral Given 05/15/18 1457)  hydrOXYzine (ATARAX/VISTARIL) tablet 25 mg (25 mg Oral Given 05/15/18 1528)     Initial Impression / Assessment and Plan / ED Course  I have reviewed the triage vital signs and the nursing notes.  Pertinent labs & imaging results that were available during my care of the patient were reviewed by me and considered in my medical decision making (see chart for details).     Patient with 5-day history of abdominal bloating, belching, nausea and vomiting, and epigastric abdominal pain after taking ibuprofen for her menstrual cramps.  Has a history of GI bleed secondary to NSAIDs.  She is afebrile, vital signs are stable.  She is nontoxic in appearance.  No peritoneal signs on examination of the abdomen.  She has tenderness in the epigastric region and left upper quadrant.  Lab work reviewed by me shows no leukocytosis, no anemia.  H&H is stable.  No metabolic derangements, LFTs, lipase, and creatinine are within normal limits.  UA is not concerning for UTI or nephrolithiasis. Suspect hematemesis likely secondary to Mallory-Weiss tear and not likely to be due to esophageal varies or Boerhaave.  See no indication for emergent imaging at this time and have a low suspicion of obstruction, perforation, appendicitis, colitis, or other acute surgical abdominal pathology.  Doubt ectopic pregnancy, PID, ovarian torsion, or TOA in the absence of lower abdominal pain.  Symptoms appear consistent with gastritis/PUD.  Will give GI cocktail, Carafate, and Zantac.  4:00PM Upon reassessment, patient is sobbing uncontrollably, tachypneic.  She states that she is having a panic attack which occurs occasionally.  She states that she does not want to live any longer, does not have any specific  plan.  She does note that she sometimes has urges to hurt others but states "I just have no outlet for my anger ".  Denies auditory or visual hallucinations.  She agrees to stay in the ED for TTS assessment.  With regards  to her abdominal pain, she states her symptoms have improved after ministration medication.  She is tolerating p.o. fluids in the ED without difficulty and serial abdominal examinations remain benign.  5:29 PM Signed out to oncoming provider PA Shrosbree.  Awaiting TTS consultation.  If patient is found to be psychiatrically cleared, stable for discharge home with Zantac and Carafate.  We had an extensive discussion regarding dietary modifications and avoidance of NSAIDs.  Of note, patient is here voluntarily.  If she attempts to leave prior to TTS evaluation she may require IVC.  Final Clinical Impressions(s) / ED Diagnoses   Final diagnoses:  Epigastric pain  Non-intractable vomiting with nausea, unspecified vomiting type  Suicidal ideations  Anxiety    ED Discharge Orders         Ordered    ranitidine (ZANTAC) 150 MG tablet  2 times daily     05/15/18 1652    sucralfate (CARAFATE) 1 g tablet  3 times daily with meals & bedtime     05/15/18 1652            Jeanie Sewer, PA-C 05/15/18 1730    Melene Plan, DO 05/17/18 [phone]

## 2018-05-15 NOTE — ED Provider Notes (Signed)
Received signout at shift change [facility].  Please see her H&P for further details.  Patient reportedly was sobbing uncontrollably and told Dorene GrebeMina that she was having a panic attack.  She stated that she does not want to live any longer, but did not have a specific plan.  I reevaluated the patient several hours later.  She is calm and cooperative.  She states that she is not suicidal or homicidal.  States that she just feels overwhelmed sometimes and does not have an outlet for her anger.  She does not have any plan to harm herself or others.  She is asking to go home.  TTS interviewed the patient, but she has not had a disposition by psychiatry.  Given my discussion with this patient, I feel that she can be discharged home. I have counseled her on strict return precautions.  She agrees and appears reliable.   Vitals:   05/15/18 1700 05/15/18 1914  BP: 114/79 (!) 122/96  Pulse: 90 97  Resp: 18 18  Temp:    SpO2: 96% 100%      Kellie ShropshireShrosbree, Damiah Mcdonald J, PA-C 05/15/18 1931    Melene PlanFloyd, Dan, DO 05/17/18 [phone]
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
## 2018-05-15 NOTE — Discharge Instructions (Addendum)
Start taking Zantac twice daily.  You can take Carafate with meals and at night.  Avoid fried foods, fatty foods, alcohol, or acidic foods.  You can also elevate the head of the bed. Avoid ibuprofen.  Follow-up with your gastroneurologist for reevaluation of your symptoms.  Return to the emergency department if any concerning signs or symptoms develop such as fevers, persistent vomiting, or worsening pain.  Please also return if you have any thoughts of hurting your self or others.

## 2018-05-15 NOTE — ED Notes (Signed)
PT redirected to ED by Dr. Delton SeeNelson. Pt taken to ED via wheelchair by this RN

## 2018-05-21 ENCOUNTER — Ambulatory Visit: Payer: PRIVATE HEALTH INSURANCE | Admitting: Gastroenterology

## 2018-06-02 ENCOUNTER — Emergency Department (HOSPITAL_COMMUNITY)
Admission: EM | Admit: 2018-06-02 | Discharge: 2018-06-02 | Disposition: A | Discharge status: Home / Self Care | Payer: PRIVATE HEALTH INSURANCE | Attending: Emergency Medicine | Admitting: Emergency Medicine

## 2018-06-02 ENCOUNTER — Other Ambulatory Visit: Payer: Self-pay

## 2018-06-02 ENCOUNTER — Ambulatory Visit (HOSPITAL_COMMUNITY)
Admission: EM | Admit: 2018-06-02 | Discharge: 2018-06-02 | Disposition: A | Discharge status: Home / Self Care | Payer: PRIVATE HEALTH INSURANCE | Attending: Family Medicine | Admitting: Family Medicine

## 2018-06-02 ENCOUNTER — Encounter (HOSPITAL_COMMUNITY): Payer: Self-pay | Admitting: Emergency Medicine

## 2018-06-02 DIAGNOSIS — Z79899 Other long term (current) drug therapy: Secondary | ICD-10-CM | POA: Insufficient documentation

## 2018-06-02 DIAGNOSIS — N938 Other specified abnormal uterine and vaginal bleeding: Secondary | ICD-10-CM

## 2018-06-02 DIAGNOSIS — R102 Pelvic and perineal pain: Secondary | ICD-10-CM | POA: Diagnosis not present

## 2018-06-02 DIAGNOSIS — R1030 Lower abdominal pain, unspecified: Secondary | ICD-10-CM

## 2018-06-02 DIAGNOSIS — R112 Nausea with vomiting, unspecified: Secondary | ICD-10-CM | POA: Diagnosis not present

## 2018-06-02 DIAGNOSIS — E119 Type 2 diabetes mellitus without complications: Secondary | ICD-10-CM | POA: Diagnosis not present

## 2018-06-02 DIAGNOSIS — F17290 Nicotine dependence, other tobacco product, uncomplicated: Secondary | ICD-10-CM | POA: Diagnosis not present

## 2018-06-02 DIAGNOSIS — F410 Panic disorder [episodic paroxysmal anxiety] without agoraphobia: Secondary | ICD-10-CM | POA: Insufficient documentation

## 2018-06-02 DIAGNOSIS — Z3202 Encounter for pregnancy test, result negative: Secondary | ICD-10-CM

## 2018-06-02 DIAGNOSIS — N939 Abnormal uterine and vaginal bleeding, unspecified: Secondary | ICD-10-CM

## 2018-06-02 LAB — CBC WITH DIFFERENTIAL/PLATELET
Abs Immature Granulocytes: 0 10*3/uL (ref 0.0–0.1)
BASOS PCT: 0 %
Basophils Absolute: 0 10*3/uL (ref 0.0–0.1)
EOS ABS: 0.1 10*3/uL (ref 0.0–0.7)
Eosinophils Relative: 1 %
HEMATOCRIT: 38.9 % (ref 36.0–46.0)
Hemoglobin: 12 g/dL (ref 12.0–15.0)
IMMATURE GRANULOCYTES: 1 %
LYMPHS ABS: 1.7 10*3/uL (ref 0.7–4.0)
Lymphocytes Relative: 25 %
MCH: 24.1 pg — ABNORMAL LOW (ref 26.0–34.0)
MCHC: 30.8 g/dL (ref 30.0–36.0)
MCV: 78.3 fL (ref 78.0–100.0)
Monocytes Absolute: 0.5 10*3/uL (ref 0.1–1.0)
Monocytes Relative: 8 %
NEUTROS PCT: 65 %
Neutro Abs: 4.4 10*3/uL (ref 1.7–7.7)
PLATELETS: 268 10*3/uL (ref 150–400)
RBC: 4.97 MIL/uL (ref 3.87–5.11)
RDW: 14.9 % (ref 11.5–15.5)
WBC: 6.7 10*3/uL (ref 4.0–10.5)

## 2018-06-02 LAB — COMPREHENSIVE METABOLIC PANEL
ALBUMIN: 3.4 g/dL — AB (ref 3.5–5.0)
ALT: 16 U/L (ref 0–44)
AST: 18 U/L (ref 15–41)
Alkaline Phosphatase: 86 U/L (ref 38–126)
Anion gap: 6 (ref 5–15)
BUN: 7 mg/dL (ref 6–20)
CHLORIDE: 109 mmol/L (ref 98–111)
CO2: 24 mmol/L (ref 22–32)
Calcium: 8.9 mg/dL (ref 8.9–10.3)
Creatinine, Ser: 0.67 mg/dL (ref 0.44–1.00)
GFR calc Af Amer: >60 mL/min (ref >60)
GFR calc non Af Amer: >60 mL/min (ref >60)
GLUCOSE: 92 mg/dL (ref 70–99)
POTASSIUM: 4.3 mmol/L (ref 3.5–5.1)
SODIUM: 139 mmol/L (ref 135–145)
Total Bilirubin: 0.4 mg/dL (ref 0.3–1.2)
Total Protein: 6.7 g/dL (ref 6.5–8.1)

## 2018-06-02 LAB — POCT URINALYSIS DIP (DEVICE)
Bilirubin Urine: NEGATIVE
GLUCOSE, UA: NEGATIVE mg/dL
Ketones, ur: NEGATIVE mg/dL
LEUKOCYTES UA: NEGATIVE
Nitrite: NEGATIVE
Protein, ur: NEGATIVE mg/dL
SPECIFIC GRAVITY, URINE: 1.02 (ref 1.005–1.030)
UROBILINOGEN UA: 0.2 mg/dL (ref 0.0–1.0)
pH: 6 (ref 5.0–8.0)

## 2018-06-02 LAB — POCT PREGNANCY, URINE: Preg Test, Ur: NEGATIVE

## 2018-06-02 LAB — LIPASE, BLOOD: Lipase: 28 U/L (ref 11–51)

## 2018-06-02 MED ORDER — ONDANSETRON 4 MG PO TBDP
4.0000 mg | ORAL_TABLET | Freq: Once | ORAL | Status: AC
  Administered 2018-06-02: 4 mg via ORAL

## 2018-06-02 MED ORDER — ONDANSETRON 4 MG PO TBDP
ORAL_TABLET | ORAL | Status: AC
  Filled 2018-06-02: qty 1

## 2018-06-02 MED ORDER — KETOROLAC TROMETHAMINE 60 MG/2ML IM SOLN
60.0000 mg | Freq: Once | INTRAMUSCULAR | Status: AC
  Administered 2018-06-02: 60 mg via INTRAMUSCULAR

## 2018-06-02 MED ORDER — KETOROLAC TROMETHAMINE 30 MG/ML IJ SOLN
15.0000 mg | Freq: Once | INTRAMUSCULAR | Status: AC
  Administered 2018-06-02: 15 mg via INTRAVENOUS
  Filled 2018-06-02: qty 1

## 2018-06-02 MED ORDER — MAGNESIUM SULFATE 2 GM/50ML IV SOLN
2.0000 g | Freq: Once | INTRAVENOUS | Status: AC
  Administered 2018-06-02: 2 g via INTRAVENOUS
  Filled 2018-06-02: qty 50

## 2018-06-02 MED ORDER — KETOROLAC TROMETHAMINE 60 MG/2ML IM SOLN
INTRAMUSCULAR | Status: AC
  Filled 2018-06-02: qty 2

## 2018-06-02 MED ORDER — IBUPROFEN 800 MG PO TABS: 800.0000 mg | ORAL_TABLET | Freq: Three times a day (TID) | ORAL | 0 refills | Status: DC | PRN

## 2018-06-02 NOTE — ED Triage Notes (Signed)
Verbalizes abd pain started yesterday- with small amount of vag bleeding, "I feel like my intestines ar stabbing me- not like my period pain or endometriosis"  Has TENS unit on lower abd, states it isn't working.   Crying and hyperventilating on arrival to trauma B- given a brown bag to breath in- hx of anxiety and pseudoseizures,

## 2018-06-02 NOTE — ED Notes (Signed)
Pt called out asking for pain meds. Notified Arthor CaptainAbigail Harris, PA-C

## 2018-06-02 NOTE — ED Triage Notes (Addendum)
Reports abdominal pain and light bleeding initially.   Symptoms started yesterday.  Bleeding is still light.  Continues to have lower abdominal pain, pelvic pain.  Last bm this morning, light in color otherwise normal.  Denies pain with urination.   "feels like intestines falling out"

## 2018-06-02 NOTE — Discharge Instructions (Addendum)
Contact a health care provider if: Medicine does not help your pain. Your pain comes back. You have new symptoms. You have abnormal vaginal discharge or bleeding, including bleeding after menopause. You have a fever or chills. You are constipated. You have blood in your urine or stool. You have foul-smelling urine. You feel weak or light-headed. Get help right away if: You have sudden severe pain. Your pain gets steadily worse. You have severe pain along with fever, nausea, vomiting, or excessive sweating. You lose consciousness. 

## 2018-06-02 NOTE — ED Provider Notes (Addendum)
[place]    CSN: [id number] Arrival date & time: 06/02/18  1275     History   Chief Complaint Chief Complaint  Patient presents with  . Abdominal Pain    HPI Ashlee Thompson is a 22 y.o. female history of DM type II, previous GI bleed related to NSAIDs, endometriosis, OAB, IBS presenting today for evaluation of pelvic pain.  Patient states that beginning yesterday she developed some lower abdominal pain/pelvic pain.  This pain has worsened into today.  She also notes that she started to have vaginal bleeding yesterday, notes that this was lighter than her normal cycle.  This is around the time of when she should start her next menstrual cycle.  Uses Nexplanon for birth control.  She has had associated nausea and vomiting since onset of symptoms.  Patient states that it is normal for her to have nausea vomiting with menstrual cramping, but the pain itself is different in the location and intensity that she normally experiences with her cycles.  She has been taking ibuprofen 800 without relief as well as using Omerion muscle stimulator.  She notes she has been having regular bowel movements that are solid, but denies it being hard.  She has noted that her stools have been pale for the past 3 days.  Denies blood in the stool.  Denies fevers.  States that she recently was tested for STDs and has not been sexually active since. HPI  Past Medical History:  Diagnosis Date  . Anxiety    not taking meds  . Bilateral ovarian cysts   . Diabetes mellitus without complication (HCC)    prediabetic  . GI bleed due to NSAIDs   . Myocardial infarction ([place])    2017   . Pseudoseizures   . 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 Headache   . UGI bleed 07/07/2016  . History of diabetes mellitus 07/07/2016  . Morbid obesity ([place]) 07/07/2016  . Hematemesis with nausea 07/07/2016  . GI bleed 07/07/2016    Past Surgical History:    Procedure Laterality Date  . ESOPHAGOGASTRODUODENOSCOPY N/A 07/07/2016   Procedure: ESOPHAGOGASTRODUODENOSCOPY (EGD);  Surgeon: Manus Gunning, MD;  Location: [place];  Service: Gastroenterology;  Laterality: N/A;  . HEMORROIDECTOMY      OB History    Gravida  1   Para  0   Term  0   Preterm  0   AB  1   Living  0     SAB  1   TAB  0   Ectopic  0   Multiple  0   Live Births  0            Home Medications    Prior to Admission medications   Medication Sig Start Date End Date Taking? Authorizing Provider  linaclotide (LINZESS) 72 MCG capsule Take 72 mcg by mouth daily before breakfast.   Yes [provider]  omeprazole (PRILOSEC) 40 MG capsule Take 40 mg by mouth 2 (two) times daily as needed (for reflux).  06/08/17  Yes [provider]  diphenhydrAMINE (BENADRYL) 25 MG tablet Take 25 mg by mouth every 6 (six) hours as needed for allergies (bee stings).    [provider]  etonogestrel (NEXPLANON) 68 MG IMPL implant 1 each by Subdermal route continuous. Placed 05/2015    [provider]  hydrocortisone (ANUSOL-HC) 2.5 % rectal cream Place 1 application rectally  3 (three) times daily as needed for hemorrhoids or anal itching.    [provider]  ibuprofen (ADVIL,MOTRIN) 800 MG tablet Take 1 tablet (800 mg total) by mouth 3 (three) times daily. Patient not taking: Reported on 05/15/2018 05/04/18   Wende Mott, CNM  ondansetron [facility] ODT) 8 MG disintegrating tablet 28m ODT q4 hours prn nausea Patient not taking: Reported on 05/15/2018 05/05/18   Muthersbaugh, HJarrett Soho PA-C  polyethylene glycol (MIRALAX / GLYCOLAX) packet Take 17 g by mouth daily as needed for mild constipation.    [provider]  ranitidine (ZANTAC) 150 MG tablet Take 1 tablet (150 mg total) by mouth 2 (two) times daily. 05/15/18   FNils Flack Mina A, PA-C  sucralfate (CARAFATE) 1 g tablet Take 1 tablet (1 g total) by mouth 4 (four) times  daily -  with meals and at bedtime. 05/15/18   FRenita Papa PA-C    Family History Family History  Problem Relation Age of Onset  . Hyperlipidemia Mother   . Mental illness Mother   . Hyperlipidemia Father   . Mental illness Brother   . Heart disease Paternal Grandfather   . Hyperlipidemia Paternal Grandfather   . Hypertension Paternal Grandfather   . CAD Other   . Cancer Other     Social History Social History   Tobacco Use  . Smoking status: Current Some Day Smoker    Packs/day: 0.25    Types: Cigars  . Smokeless tobacco: Never Used  Substance Use Topics  . Alcohol use: Yes    Comment: social  . Drug use: Yes    Types: Marijuana    Comment: yesterday at 1500     Allergies   Bentyl [dicyclomine hcl]; Haldol [haloperidol lactate]; Tramadol; and Latex   Review of Systems Review of Systems  Constitutional: Negative for fatigue and fever.  HENT: Negative for congestion, sinus pressure and sore throat.   Eyes: Negative for photophobia, pain and visual disturbance.  Respiratory: Negative for cough and shortness of breath.   Cardiovascular: Negative for chest pain.  Gastrointestinal: Positive for abdominal pain. Negative for nausea and vomiting.  Genitourinary: Positive for menstrual problem, pelvic pain and vaginal bleeding. Negative for decreased urine volume, dysuria, hematuria and vaginal discharge.  Musculoskeletal: Negative for myalgias, neck pain and neck stiffness.  Neurological: Positive for headaches. Negative for dizziness, syncope, facial asymmetry, speech difficulty, weakness, light-headedness and numbness.     Physical Exam Triage Vital Signs ED Triage Vitals [06/02/18 0850]  Enc Vitals Group     BP 109/67     Pulse Rate 96     Resp (!) 22     Temp 97.8 F (36.6 C)     Temp src      SpO2 98 %     Weight      Height      Head Circumference      Peak Flow      Pain Score      Pain Loc      Pain Edu?      Excl. in [place]    No data  found.  Updated Vital Signs BP 109/67 (BP Location: Left Arm) Comment (BP Location): large cuff  Pulse 96   Temp 97.8 F (36.6 C)   Resp (!) 22   LMP 05/04/2018   SpO2 98%   Visual Acuity Right Eye Distance:   Left Eye Distance:   Bilateral Distance:    Right Eye Near:   Left Eye Near:  Bilateral Near:     Physical Exam  Constitutional: She appears well-developed and well-nourished. She appears distressed.  Lying on exam table, not wanting to move due to pain  HENT:  Head: Normocephalic and atraumatic.  Eyes: Conjunctivae are normal.  Neck: Neck supple.  Cardiovascular: Normal rate and regular rhythm.  No murmur heard. Pulmonary/Chest: Effort normal and breath sounds normal. No respiratory distress.  Abdominal: Soft. There is no tenderness.  Abdomen soft, nondistended, minimal tenderness to palpation of abdomen, patient states that pressure next discomfort felt better; after pelvic exam, pain localizing more to the right lower quadrant  Genitourinary:  Genitourinary Comments: Significant discomfort with speculum exam, bright red blood seen in the vagina, significant discomfort with bimanual exam.  Small skin tag on rectal exam, no hemorrhoid or other cause of discomfort visualized rectally.  Musculoskeletal: She exhibits no edema.  Neurological: She is alert.  Skin: Skin is warm and dry.  Psychiatric: She has a normal mood and affect.  Nursing note and vitals reviewed.    UC Treatments / Results  Labs (all labs ordered are listed, but only abnormal results are displayed) Labs Reviewed  POCT URINALYSIS DIP (DEVICE) - Abnormal; Notable for the following components:      Result Value   Hgb urine dipstick LARGE (*)    All other components within normal limits  POCT PREGNANCY, URINE    EKG None  Radiology No results found.  Procedures Procedures (including critical care time)  Medications Ordered in UC Medications  ketorolac (TORADOL) injection 60 mg  (has no administration in time range)  ondansetron (ZOFRAN-ODT) disintegrating tablet 4 mg (has no administration in time range)    Initial Impression / Assessment and Plan / UC Course  I have reviewed the triage vital signs and the nursing notes.  Pertinent labs & imaging results that were available during my care of the patient were reviewed by me and considered in my medical decision making (see chart for details).     Patient with pelvic pain with recent onset of vaginal bleeding, symptoms most likely related to menstrual cycle.  Significant discomfort related to movement.  Will provide patient with Toradol, Zofran.  Given significant amount of discomfort will have patient go to emergency room for further evaluation of abdominal pelvic pain.  Vital signs stable, patient wheeled to emergency room, on transfer, patient began to have seizure like activity, medical assistant was met with the nurse who helped complete the transport.  Final Clinical Impressions(s) / UC Diagnoses   Final diagnoses:  Vaginal bleeding  Pelvic pain in female     Discharge Instructions     Pain most likely related to menstrual cycle, because of the severity of your pain please go to ED for further evaluation of this pain We gave you zofran for nausea and toradol for pain   ED Prescriptions    None     Controlled Substance Prescriptions [place] Controlled Substance Registry consulted? Not Applicable   Janith Lima, PA-C 06/02/18 0945    Janith Lima, PA-C 06/02/18 1027

## 2018-06-02 NOTE — Discharge Instructions (Signed)
Pain most likely related to menstrual cycle, because of the severity of your pain please go to ED for further evaluation of this pain We gave you zofran for nausea and toradol for pain

## 2018-06-02 NOTE — ED Provider Notes (Signed)
[facility]   CSN: [id number] Arrival date & time: 06/02/18  1007     History   Chief Complaint Chief Complaint  Patient presents with  . Anxiety  . Pelvic Pain    HPI Ashlee Thompson is a 22 y.o. female wheeled emergently to the Trauma bay with concern for potential seizure and airway issues. Patient was sitting upright in a wheel chair, making guttural sounds and breathing rapidly. The patient was able to make purposeful movements and help herself to the gurney but would not answer questions. She was hyperventilating. She did not appear to be having a seizure or airway problems. Review of EMR shows that patient ws sent by UC for further work up. She presented for evaluation of abdominal and pelvic pain. She has a hx of heavy vaginal bleeding, endometriosis,   HPI  Past Medical History:  Diagnosis Date  . Anxiety    not taking meds  . Bilateral ovarian cysts   . Diabetes mellitus without complication (HCC)    prediabetic  . GI bleed due to NSAIDs   . Myocardial infarction (HCC)    2017   . Pseudoseizures   . Seizures (HCC)    pseudoseizures    Patient Active Problem List   Diagnosis Date Noted  . Headache   . UGI bleed 07/07/2016  . History of diabetes mellitus 07/07/2016  . Morbid obesity (HCC) 07/07/2016  . Hematemesis with nausea 07/07/2016  . GI bleed 07/07/2016    Past Surgical History:  Procedure Laterality Date  . ESOPHAGOGASTRODUODENOSCOPY N/A 07/07/2016   Procedure: ESOPHAGOGASTRODUODENOSCOPY (EGD);  Surgeon: Ruffin FrederickSteven Paul Armbruster, MD;  [facility];  Service: Gastroenterology;  Laterality: N/A;  . HEMORROIDECTOMY       OB History    Gravida  1   Para  0   Term  0   Preterm  0   AB  1   Living  0     SAB  1   TAB  0   Ectopic  0   Multiple  0   Live Births  0            Home Medications    Prior to Admission medications   Medication Sig Start Date End Date  Taking? Authorizing Provider  diphenhydrAMINE (BENADRYL) 25 MG tablet Take 25 mg by mouth every 6 (six) hours as needed for allergies (bee stings).    [provider]  etonogestrel (NEXPLANON) 68 MG IMPL implant 1 each by Subdermal route continuous. Placed 05/2015    [provider]  hydrocortisone (ANUSOL-HC) 2.5 % rectal cream Place 1 application rectally 3 (three) times daily as needed for hemorrhoids or anal itching.    [provider]  ibuprofen (ADVIL,MOTRIN) 800 MG tablet Take 1 tablet (800 mg total) by mouth 3 (three) times daily. Patient not taking: Reported on 05/15/2018 05/04/18   Rolm BookbinderNeill, Caroline M, CNM  linaclotide [facility](LINZESS) 72 MCG capsule Take 72 mcg by mouth daily before breakfast.    [provider]  omeprazole (PRILOSEC) 40 MG capsule Take 40 mg by mouth 2 (two) times daily as needed (for reflux).  06/08/17   [provider]  ondansetron (ZOFRAN ODT) 8 MG disintegrating tablet 8mg  ODT q4 hours prn nausea Patient not taking: Reported on 05/15/2018 05/05/18   Muthersbaugh, Dahlia ClientHannah, PA-C  polyethylene glycol (MIRALAX / GLYCOLAX) packet Take 17 g by mouth daily as needed for mild constipation.    [provider]  ranitidine (  ZANTAC) 150 MG tablet Take 1 tablet (150 mg total) by mouth 2 (two) times daily. 05/15/18   Luevenia Maxin, Mina A, PA-C  sucralfate (CARAFATE) 1 g tablet Take 1 tablet (1 g total) by mouth 4 (four) times daily -  with meals and at bedtime. 05/15/18   Jeanie Sewer, PA-C    Family History Family History  Problem Relation Age of Onset  . Hyperlipidemia Mother   . Mental illness Mother   . Hyperlipidemia Father   . Mental illness Brother   . Heart disease Paternal Grandfather   . Hyperlipidemia Paternal Grandfather   . Hypertension Paternal Grandfather   . CAD Other   . Cancer Other     Social History Social History   Tobacco Use  . Smoking status: Current Some Day Smoker    Packs/day: 0.25    Types: Cigars  .  Smokeless tobacco: Never Used  Substance Use Topics  . Alcohol use: Yes    Comment: social  . Drug use: Yes    Types: Marijuana    Comment: yesterday at 1500     Allergies   Bentyl [dicyclomine hcl]; Haldol [haloperidol lactate]; Tramadol; and Latex   Review of Systems Review of Systems Ten systems reviewed and are negative for acute change, except as noted in the HPI.    Physical Exam Updated Vital Signs BP 117/79 (BP Location: Left Arm)   Pulse 96   Temp 97.8 F (36.6 C) (Temporal)   Resp (!) 28   LMP 05/04/2018   SpO2 100%   Physical Exam  Constitutional: She is oriented to person, place, and time. She appears well-developed and well-nourished. No distress.  HENT:  Head: Normocephalic and atraumatic.  Eyes: Conjunctivae are normal. No scleral icterus.  Neck: Normal range of motion.  Cardiovascular: Normal rate, regular rhythm and normal heart sounds. Exam reveals no gallop and no friction rub.  No murmur heard. Pulmonary/Chest: Effort normal and breath sounds normal. No respiratory distress.  Abdominal: Soft. Bowel sounds are normal. She exhibits no distension and no mass. There is tenderness. There is no guarding.  Diffuse mild tenderness  Neurological: She is alert and oriented to person, place, and time.  Skin: Skin is warm and dry. She is not diaphoretic.  Psychiatric: Her mood appears anxious.  Nursing note and vitals reviewed.    ED Treatments / Results  Labs (all labs ordered are listed, but only abnormal results are displayed) Labs Reviewed  CBC WITH DIFFERENTIAL/PLATELET - Abnormal; Notable for the following components:      Result Value   MCH 24.1 (*)    All other components within normal limits  COMPREHENSIVE METABOLIC PANEL - Abnormal; Notable for the following components:   Albumin 3.4 (*)    All other components within normal limits  LIPASE, BLOOD    EKG None  Radiology No results found.  Procedures Procedures (including critical  care time)  Medications Ordered in ED Medications - No data to display   Initial Impression / Assessment and Plan / ED Course  I have reviewed the triage vital signs and the nursing notes.  Pertinent labs & imaging results that were available during my care of the patient were reviewed by me and considered in my medical decision making (see chart for details).     22 year old female who presented with acute panic attack and pseudoseizures she has been here 8 times in the last 6 months and has had multiple pelvic ultrasounds.  She states that she has a  history of endometriosis.  Her LMP was 1 month ago and she is at the time to begin menstruation again.  She had a negative pelvic ultrasound and transvaginal ultrasound performed 819.  I discussed the case with Dr. Rubin Payor and I do not feel that she needs repeat imaging.  He agrees.  The patient received IV mag 2 g push for her headache which was I think secondary to crying and hyperventilating.  Felt that the magnesium would also help with any smooth muscle contraction pain and cramping.  The patient feels greatly improved after intervention.  I discussed return precautions.  Patient is to follow-up with the [facility] for any further issues with vaginal bleeding.  Final Clinical Impressions(s) / ED Diagnoses   Final diagnoses:  Panic attack  Pelvic pain in female    ED Discharge Orders    None       Arthor Captain, PA-C 06/02/18 1712    Benjiman Core, MD 06/03/18 2022

## 2018-06-02 NOTE — Progress Notes (Signed)
   06/02/18 1000  Clinical Encounter Type  Visited With Patient  Visit Type Follow-up  Spiritual Encounters  Spiritual Needs Emotional  Already in ED patient was brought into Trauma B from parking lot area. Patient just left Urgent Care complaining of abdominal pain. Chaplain stood bedside and had silent prayer. Per nurse no calls need to be made on patient behalf.

## 2018-07-04 ENCOUNTER — Encounter (HOSPITAL_COMMUNITY): Payer: Self-pay | Admitting: *Deleted

## 2018-07-04 ENCOUNTER — Emergency Department (HOSPITAL_COMMUNITY): Payer: Self-pay

## 2018-07-04 ENCOUNTER — Emergency Department (HOSPITAL_COMMUNITY)
Admission: EM | Admit: 2018-07-04 | Discharge: 2018-07-05 | Disposition: A | Discharge status: Home / Self Care | Payer: Self-pay | Attending: Emergency Medicine | Admitting: Emergency Medicine

## 2018-07-04 DIAGNOSIS — K6289 Other specified diseases of anus and rectum: Secondary | ICD-10-CM

## 2018-07-04 DIAGNOSIS — F17290 Nicotine dependence, other tobacco product, uncomplicated: Secondary | ICD-10-CM | POA: Insufficient documentation

## 2018-07-04 DIAGNOSIS — I252 Old myocardial infarction: Secondary | ICD-10-CM | POA: Insufficient documentation

## 2018-07-04 DIAGNOSIS — E119 Type 2 diabetes mellitus without complications: Secondary | ICD-10-CM | POA: Insufficient documentation

## 2018-07-04 DIAGNOSIS — N83202 Unspecified ovarian cyst, left side: Secondary | ICD-10-CM

## 2018-07-04 DIAGNOSIS — N83292 Other ovarian cyst, left side: Secondary | ICD-10-CM | POA: Insufficient documentation

## 2018-07-04 DIAGNOSIS — R1030 Lower abdominal pain, unspecified: Secondary | ICD-10-CM

## 2018-07-04 LAB — CBC
HCT: 41.9 % (ref 36.0–46.0)
HEMOGLOBIN: 12.6 g/dL (ref 12.0–15.0)
MCH: 23.3 pg — ABNORMAL LOW (ref 26.0–34.0)
MCHC: 30.1 g/dL (ref 30.0–36.0)
MCV: 77.6 fL — ABNORMAL LOW (ref 80.0–100.0)
NRBC: 0 % (ref 0.0–0.2)
Platelets: 303 10*3/uL (ref 150–400)
RBC: 5.4 MIL/uL — AB (ref 3.87–5.11)
RDW: 14.7 % (ref 11.5–15.5)
WBC: 7.6 10*3/uL (ref 4.0–10.5)

## 2018-07-04 LAB — COMPREHENSIVE METABOLIC PANEL
ALT: 11 U/L (ref 0–44)
ANION GAP: 5 (ref 5–15)
AST: 16 U/L (ref 15–41)
Albumin: 3.6 g/dL (ref 3.5–5.0)
Alkaline Phosphatase: 85 U/L (ref 38–126)
BUN: <5 mg/dL — ABNORMAL LOW (ref 6–20)
CO2: 24 mmol/L (ref 22–32)
Calcium: 9.1 mg/dL (ref 8.9–10.3)
Chloride: 109 mmol/L (ref 98–111)
Creatinine, Ser: 0.63 mg/dL (ref 0.44–1.00)
Glucose, Bld: 99 mg/dL (ref 70–99)
POTASSIUM: 4 mmol/L (ref 3.5–5.1)
Sodium: 138 mmol/L (ref 135–145)
TOTAL PROTEIN: 6.8 g/dL (ref 6.5–8.1)
Total Bilirubin: 0.4 mg/dL (ref 0.3–1.2)

## 2018-07-04 LAB — URINALYSIS, ROUTINE W REFLEX MICROSCOPIC
BILIRUBIN URINE: NEGATIVE
GLUCOSE, UA: NEGATIVE mg/dL
Ketones, ur: NEGATIVE mg/dL
LEUKOCYTES UA: NEGATIVE
NITRITE: NEGATIVE
PROTEIN: NEGATIVE mg/dL
Specific Gravity, Urine: 1.017 (ref 1.005–1.030)
pH: 6 (ref 5.0–8.0)

## 2018-07-04 LAB — I-STAT BETA HCG BLOOD, ED (MC, WL, AP ONLY): I-stat hCG, quantitative: <5 m[IU]/mL (ref <5)

## 2018-07-04 LAB — LIPASE, BLOOD: LIPASE: 31 U/L (ref 11–51)

## 2018-07-04 MED ORDER — HYDROCODONE-ACETAMINOPHEN 5-325 MG PO TABS
1.0000 | ORAL_TABLET | Freq: Once | ORAL | Status: AC
  Administered 2018-07-04: 1 via ORAL
  Filled 2018-07-04: qty 1

## 2018-07-04 MED ORDER — IBUPROFEN 800 MG PO TABS: 800.0000 mg | ORAL_TABLET | Freq: Three times a day (TID) | ORAL | 0 refills | Status: AC | PRN

## 2018-07-04 MED ORDER — MORPHINE SULFATE (PF) 4 MG/ML IV SOLN
4.0000 mg | INTRAVENOUS | Status: DC | PRN
  Administered 2018-07-05: 4 mg via INTRAVENOUS
  Filled 2018-07-04: qty 1

## 2018-07-04 MED ORDER — MORPHINE SULFATE (PF) 4 MG/ML IV SOLN
4.0000 mg | Freq: Once | INTRAVENOUS | Status: AC
  Administered 2018-07-04: 4 mg via INTRAVENOUS
  Filled 2018-07-04: qty 1

## 2018-07-04 MED ORDER — IOHEXOL 300 MG/ML  SOLN
100.0000 mL | Freq: Once | INTRAMUSCULAR | Status: AC | PRN
  Administered 2018-07-04: 100 mL via INTRAVENOUS

## 2018-07-04 NOTE — ED Notes (Signed)
Patient transported to CT 

## 2018-07-04 NOTE — ED Notes (Signed)
Results reviewed, no changes in acuity at this time 

## 2018-07-04 NOTE — ED Notes (Signed)
Pt ambulated to bathroom independently

## 2018-07-04 NOTE — ED Notes (Signed)
Pt refused wheelchair and says she can not sit down.

## 2018-07-04 NOTE — ED Triage Notes (Signed)
Pt in c/o abdominal pain that she thinks is from her endometriosis, also has internal hemorrhoids that have started to become external and are very painful, one was bleeding some this morning, pt has suppositories that she uses at home for her hemorrhoids but she was not able to insert them today

## 2018-07-04 NOTE — ED Provider Notes (Signed)
Emergency Department Provider Note   I have reviewed the triage vital signs and the nursing notes.   HISTORY  Chief Complaint Hemorrhoids and Abdominal Pain   HPI Ashlee Thompson is a 22 y.o. female with PMH of DM, ovarian cysts, and anxiety presents to the emergency department with lower abdominal and rectal pain. The patient does have history of internal hemorrhoids. She states that she also started her menstrual cycle 4 days ago. She is having cramping, lower abdominal pain with associated rectal pain.  She attempted to use a suppository for her hemorrhoids today and could not get the suppository inside because of pain.  She reports subjective fever.  No chills.  No vomiting.  Patient is having bowel movements but states they are loose. No chest pain or SOB.   Past Medical History:  Diagnosis Date  . Anxiety    not taking meds  . Bilateral ovarian cysts   . Diabetes mellitus without complication (HCC)    prediabetic  . GI bleed due to NSAIDs   . Myocardial infarction (HCC)    2017   . Pseudoseizures   . Seizures (HCC)    pseudoseizures    Patient Active Problem List   Diagnosis Date Noted  . Headache   . UGI bleed 07/07/2016  . History of diabetes mellitus 07/07/2016  . Morbid obesity (HCC) 07/07/2016  . Hematemesis with nausea 07/07/2016  . GI bleed 07/07/2016    Past Surgical History:  Procedure Laterality Date  . ESOPHAGOGASTRODUODENOSCOPY N/A 07/07/2016   Procedure: ESOPHAGOGASTRODUODENOSCOPY (EGD);  Surgeon: Ruffin Frederick, MD;  Location: [facility] ENDOSCOPY;  Service: Gastroenterology;  Laterality: N/A;  . HEMORROIDECTOMY     Allergies Bentyl [dicyclomine hcl]; Haldol [haloperidol lactate]; Toradol [ketorolac tromethamine]; Tramadol; Chocolate; and Latex  Family History  Problem Relation Age of Onset  . Hyperlipidemia Mother   . Mental illness Mother   . Hyperlipidemia Father   . Mental illness Brother   . Heart disease Paternal Grandfather   .  Hyperlipidemia Paternal Grandfather   . Hypertension Paternal Grandfather   . CAD Other   . Cancer Other     Social History Social History   Tobacco Use  . Smoking status: Current Some Day Smoker    Packs/day: 0.25    Types: Cigars  . Smokeless tobacco: Never Used  Substance Use Topics  . Alcohol use: Yes    Comment: social  . Drug use: Yes    Types: Marijuana    Comment: yesterday at 1500    Review of Systems  Constitutional: No fever/chills Eyes: No visual changes. ENT: No sore throat. Cardiovascular: Denies chest pain. Respiratory: Denies shortness of breath. Gastrointestinal: Positive lower abdominal and rectal pain.  No nausea, no vomiting.  No diarrhea.  No constipation. Genitourinary: Negative for dysuria. Musculoskeletal: Negative for back pain. Skin: Negative for rash. Neurological: Negative for headaches, focal weakness or numbness.  10-point ROS otherwise negative.  ____________________________________________   PHYSICAL EXAM:  VITAL SIGNS: ED Triage Vitals  Enc Vitals Group     BP 07/04/18 1337 126/76     Pulse Rate 07/04/18 1337 92     Resp 07/04/18 1337 20     Temp 07/04/18 1337 98 F (36.7 C)     Temp Source 07/04/18 1337 Oral     SpO2 07/04/18 1337 100 %     Pain Score 07/04/18 1338 10   Constitutional: Alert and oriented. Well appearing and in no acute distress. Eyes: Conjunctivae are normal. Head: Atraumatic. Nose: No  congestion/rhinnorhea. Mouth/Throat: Mucous membranes are moist. Neck: No stridor. Cardiovascular: Normal rate, regular rhythm. Good peripheral circulation. Grossly normal heart sounds.   Respiratory: Normal respiratory effort.  No retractions. Lungs CTAB. Gastrointestinal: Soft with diffuse lower abdominal tenderness. No distention.  Musculoskeletal: No lower extremity tenderness nor edema. No gross deformities of extremities. Neurologic:  Normal speech and language. No gross focal neurologic deficits are appreciated.    Skin:  Skin is warm, dry and intact. No rash noted.  ____________________________________________   LABS (all labs ordered are listed, but only abnormal results are displayed)  Labs Reviewed  COMPREHENSIVE METABOLIC PANEL - Abnormal; Notable for the following components:      Result Value   BUN <5 (*)    All other components within normal limits  CBC - Abnormal; Notable for the following components:   RBC 5.40 (*)    MCV 77.6 (*)    MCH 23.3 (*)    All other components within normal limits  URINALYSIS, ROUTINE W REFLEX MICROSCOPIC - Abnormal; Notable for the following components:   APPearance HAZY (*)    Hgb urine dipstick LARGE (*)    RBC / HPF >50 (*)    Bacteria, UA RARE (*)    All other components within normal limits  LIPASE, BLOOD  I-STAT BETA HCG BLOOD, ED ([facility], [facility], [facility] ONLY)   ____________________________________________  RADIOLOGY  US Transvaginal Non-ob  Result Date: 07/05/2018 CLINICAL DATA:  Left pelvic pain for 2 days EXAM: TRANSABDOMINAL AND TRANSVAGINAL ULTRASOUND OF PELVIS DOPPLER ULTRASOUND OF OVARIES TECHNIQUE: Both transabdominal and transvaginal ultrasound examinations of the pelvis were performed. Transabdominal technique was performed for global imaging of the pelvis including uterus, ovaries, adnexal regions, and pelvic cul-de-sac. It was necessary to proceed with endovaginal exam following the transabdominal exam to visualize the ovaries. Color and duplex Doppler ultrasound was utilized to evaluate blood flow to the ovaries. COMPARISON:  CT 07/04/2018, ultrasound 05/05/2018 FINDINGS: Uterus Measurements: 6.6 x 3.5 x 3.7 cm. No fibroids or other mass visualized. Endometrium Thickness: 5.2 mm.  No focal abnormality visualized. Right ovary Measurements: 2.4 x 1.9 x 1.6 cm. Normal appearance/no adnexal mass. Left ovary Measurements: 5.4 x 4.6 x 4.9 cm. Left ovarian cyst measuring 4.5 x 4.1 x 4.3 cm. Pulsed Doppler evaluation of both ovaries demonstrates normal  low-resistance arterial and venous waveforms. Other findings No abnormal free fluid. IMPRESSION: 1. Negative for ovarian torsion 2. 4.5 cm left ovarian cyst. This is almost certainly benign, and no specific imaging follow up is recommended according to the Society of Radiologists in Ultrasound2010 Consensus Conference Statement (D Lenis Noon et al. Management of Asymptomatic Ovarian and Other Adnexal Cysts Imaged at [place]: Society of Radiologists in Ultrasound Consensus Conference Statement 2010. Radiology 256 (Sept 2010): 943-954.). Electronically Signed   By: Jasmine Pang M.D.   On: 07/05/2018 00:40   US Pelvis Complete  Result Date: 07/05/2018 CLINICAL DATA:  Left pelvic pain for 2 days EXAM: TRANSABDOMINAL AND TRANSVAGINAL ULTRASOUND OF PELVIS DOPPLER ULTRASOUND OF OVARIES TECHNIQUE: Both transabdominal and transvaginal ultrasound examinations of the pelvis were performed. Transabdominal technique was performed for global imaging of the pelvis including uterus, ovaries, adnexal regions, and pelvic cul-de-sac. It was necessary to proceed with endovaginal exam following the transabdominal exam to visualize the ovaries. Color and duplex Doppler ultrasound was utilized to evaluate blood flow to the ovaries. COMPARISON:  CT 07/04/2018, ultrasound 05/05/2018 FINDINGS: Uterus Measurements: 6.6 x 3.5 x 3.7 cm. No fibroids or other mass visualized. Endometrium Thickness: 5.2 mm.  No  focal abnormality visualized. Right ovary Measurements: 2.4 x 1.9 x 1.6 cm. Normal appearance/no adnexal mass. Left ovary Measurements: 5.4 x 4.6 x 4.9 cm. Left ovarian cyst measuring 4.5 x 4.1 x 4.3 cm. Pulsed Doppler evaluation of both ovaries demonstrates normal low-resistance arterial and venous waveforms. Other findings No abnormal free fluid. IMPRESSION: 1. Negative for ovarian torsion 2. 4.5 cm left ovarian cyst. This is almost certainly benign, and no specific imaging follow up is recommended according to the Society of Radiologists in  Ultrasound2010 Consensus Conference Statement (D Lenis Noon et al. Management of Asymptomatic Ovarian and Other Adnexal Cysts Imaged at [place]: Society of Radiologists in Ultrasound Consensus Conference Statement 2010. Radiology 256 (Sept 2010): 943-954.). Electronically Signed   By: Jasmine Pang M.D.   On: 07/05/2018 00:40   Ct Abdomen Pelvis W Contrast  Result Date: 07/04/2018 CLINICAL DATA:  Acute abdominal pain. EXAM: CT ABDOMEN AND PELVIS WITH CONTRAST TECHNIQUE: Multidetector CT imaging of the abdomen and pelvis was performed using the standard protocol following bolus administration of intravenous contrast. CONTRAST:  OMNIPAQUE IOHEXOL 300 MG/ML  SOLN COMPARISON:  Most recent comparison CT and pelvic ultrasound 05/05/2018 FINDINGS: Lower chest: The lung bases are clear. Hepatobiliary: No focal liver abnormality is seen. No gallstones, gallbladder wall thickening, or biliary dilatation. Pancreas: No ductal dilatation or inflammation. Spleen: Normal in size without focal abnormality. Adrenals/Urinary Tract: Normal adrenal glands. No hydronephrosis or perinephric edema. Homogeneous renal enhancement. Urinary bladder is nondistended and not well evaluated. Stomach/Bowel: Stomach is within normal limits. Appendix appears normal. No evidence of bowel wall thickening, distention, or inflammatory changes. Vascular/Lymphatic: No significant vascular findings are present. No enlarged abdominal or pelvic lymph nodes. Reproductive: 4.9 cm left ovarian cyst, new from prior exam. Right ovary is normal in size. Uterus is unremarkable. Other: Small fat containing umbilical hernia. No free air, free fluid, or intra-abdominal fluid collection. Musculoskeletal: There are no acute or suspicious osseous abnormalities. IMPRESSION: 1. Left ovarian cyst measuring 4.9 cm, new from prior exam. Given patient age and size less than 5 cm this is considered benign and no dedicated imaging follow-up is needed. This recommendation  follows ACR consensus guidelines: White Paper of the ACR Incidental Findings Committee II on Adnexal Findings. J Am Coll Radiol 2013:10:675-681. 2. No other acute findings or change from prior exam. Electronically Signed   By: Narda Rutherford M.D.   On: 07/04/2018 22:33   [place] Art/ven Flow Abd Pelv Doppler  Result Date: 07/05/2018 CLINICAL DATA:  Left pelvic pain for 2 days EXAM: TRANSABDOMINAL AND TRANSVAGINAL ULTRASOUND OF PELVIS DOPPLER ULTRASOUND OF OVARIES TECHNIQUE: Both transabdominal and transvaginal ultrasound examinations of the pelvis were performed. Transabdominal technique was performed for global imaging of the pelvis including uterus, ovaries, adnexal regions, and pelvic cul-de-sac. It was necessary to proceed with endovaginal exam following the transabdominal exam to visualize the ovaries. Color and duplex Doppler ultrasound was utilized to evaluate blood flow to the ovaries. COMPARISON:  CT 07/04/2018, ultrasound 05/05/2018 FINDINGS: Uterus Measurements: 6.6 x 3.5 x 3.7 cm. No fibroids or other mass visualized. Endometrium Thickness: 5.2 mm.  No focal abnormality visualized. Right ovary Measurements: 2.4 x 1.9 x 1.6 cm. Normal appearance/no adnexal mass. Left ovary Measurements: 5.4 x 4.6 x 4.9 cm. Left ovarian cyst measuring 4.5 x 4.1 x 4.3 cm. Pulsed Doppler evaluation of both ovaries demonstrates normal low-resistance arterial and venous waveforms. Other findings No abnormal free fluid. IMPRESSION: 1. Negative for ovarian torsion 2. 4.5 cm left ovarian cyst. This is almost certainly  benign, and no specific imaging follow up is recommended according to the Society of Radiologists in Ultrasound2010 Consensus Conference Statement (D Lenis Noon et al. Management of Asymptomatic Ovarian and Other Adnexal Cysts Imaged at [place]: Society of Radiologists in Ultrasound Consensus Conference Statement 2010. Radiology 256 (Sept 2010): 943-954.). Electronically Signed   By: Jasmine Pang M.D.   On: 07/05/2018  00:40    ____________________________________________   PROCEDURES  Procedure(s) performed:   Procedures  None ____________________________________________   INITIAL IMPRESSION / ASSESSMENT AND PLAN / ED COURSE  Pertinent labs & imaging results that were available during my care of the patient were reviewed by me and considered in my medical decision making (see chart for details).  Patient presents to the emergency department with lower abdominal and rectal pain.  She has multiple presentations with similar but was having pain which is seemingly out of proportion to her exam findings.  She has no evidence of enlarged or thrombosed external hemorrhoids.  She does have a residual perirectal skin tag.  With her subjective fever a CT scan was ordered to rule out perirectal abscess and was negative.  The CT did show a new ovarian cyst.  Given her continued discomfort patient was sent for pelvic ultrasound. Labs reviewed with no acute findings.   US pelvis pending. Care transferred to Dr. Judd Lien.  ____________________________________________  FINAL CLINICAL IMPRESSION(S) / ED DIAGNOSES  Final diagnoses:  Lower abdominal pain  Cyst of left ovary  Rectal pain     MEDICATIONS GIVEN DURING THIS VISIT:  Medications  HYDROcodone-acetaminophen (NORCO/VICODIN) 5-325 MG per tablet 1 tablet (1 tablet Oral Given 07/04/18 1349)  iohexol (OMNIPAQUE) 300 MG/ML solution 100 mL (100 mLs Intravenous Contrast Given 07/04/18 2208)  morphine 4 MG/ML injection 4 mg (4 mg Intravenous Given 07/04/18 2254)     NEW OUTPATIENT MEDICATIONS STARTED DURING THIS VISIT:  Discharge Medication List as of 07/05/2018  1:24 AM    START taking these medications   Details  HYDROcodone-acetaminophen (NORCO) 5-325 MG tablet Take 1-2 tablets by mouth every 6 (six) hours as needed., Starting Sat 07/05/2018, Print        Note:  This document was prepared using Dragon voice recognition software and may include  unintentional dictation errors.  Alona Bene, MD Emergency Medicine    Long, Arlyss Repress, MD 07/05/18 [phone]

## 2018-07-04 NOTE — ED Notes (Signed)
Pt getting undressed and into gown.  

## 2018-07-04 NOTE — ED Notes (Signed)
Pt crying/emotional at nurse 1st desk. Pt stated she is in a lot of pain. Let Pt know I spoke to our Consulting civil engineer. Charge aware of wait time and pain.

## 2018-07-04 NOTE — Discharge Instructions (Signed)
You have been seen in the Emergency Department (ED) for abdominal pain.  Your evaluation did not identify a clear cause of your symptoms but was generally reassuring. ° °Please follow up as instructed above regarding today’s emergent visit and the symptoms that are bothering you. ° °Return to the ED if your abdominal pain worsens or fails to improve, you develop bloody vomiting, bloody diarrhea, you are unable to tolerate fluids due to vomiting, fever greater than 101, or other symptoms that concern you. ° ° °Procedures ° ° °

## 2018-07-05 MED ORDER — HYDROCODONE-ACETAMINOPHEN 5-325 MG PO TABS: 1.0000 | ORAL_TABLET | Freq: Four times a day (QID) | ORAL | 0 refills | Status: AC | PRN

## 2018-07-05 NOTE — ED Provider Notes (Signed)
Care assumed from Dr. Jacqulyn Bath at shift change.  Patient presenting here with rectal pain.  Per Dr. Jacqulyn Bath, there are no large hemorrhoids or other obvious abnormality with the rectal exam.  CT scan shows a 5 cm cyst on the left ovary, however no torsion on ultrasound.    She appears comfortable and work-up is otherwise unremarkable.  There is no evidence for rectal abscess on CT scan.  I see no indication for further work-up at this time.  She is requesting something that she can take for pain.  I have advised her that the pain medications can be constipating and worsen hemorrhoids, but have agreed to write a small quantity of hydrocodone which she can take if her pain becomes severe.  She needs to follow-up with her surgeon if symptoms persist.  She has seen [facility] in the past for hemorrhoidal banding and can follow-up with them if she has additional problems.   Geoffery Lyons, MD 07/05/18 [phone]

## 2018-07-05 NOTE — ED Notes (Signed)
Patient verbalizes understanding of discharge instructions. Opportunity for questioning and answers were provided. Armband removed by staff, pt discharged from ED ambulatory.   

## 2018-08-22 ENCOUNTER — Emergency Department (HOSPITAL_COMMUNITY)
Admission: EM | Admit: 2018-08-22 | Discharge: 2018-08-22 | Disposition: A | Discharge status: Home / Self Care | Payer: PRIVATE HEALTH INSURANCE | Attending: Emergency Medicine | Admitting: Emergency Medicine

## 2018-08-22 ENCOUNTER — Other Ambulatory Visit: Payer: Self-pay

## 2018-08-22 ENCOUNTER — Encounter (HOSPITAL_COMMUNITY): Payer: Self-pay

## 2018-08-22 DIAGNOSIS — F419 Anxiety disorder, unspecified: Secondary | ICD-10-CM | POA: Diagnosis present

## 2018-08-22 DIAGNOSIS — Z91040 Latex allergy status: Secondary | ICD-10-CM | POA: Diagnosis not present

## 2018-08-22 DIAGNOSIS — E119 Type 2 diabetes mellitus without complications: Secondary | ICD-10-CM | POA: Insufficient documentation

## 2018-08-22 DIAGNOSIS — I252 Old myocardial infarction: Secondary | ICD-10-CM | POA: Diagnosis not present

## 2018-08-22 DIAGNOSIS — Z79899 Other long term (current) drug therapy: Secondary | ICD-10-CM | POA: Insufficient documentation

## 2018-08-22 DIAGNOSIS — F17290 Nicotine dependence, other tobacco product, uncomplicated: Secondary | ICD-10-CM | POA: Insufficient documentation

## 2018-08-22 MED ORDER — LORAZEPAM 1 MG PO TABS
1.0000 mg | ORAL_TABLET | Freq: Once | ORAL | Status: AC
  Administered 2018-08-22: 1 mg via ORAL
  Filled 2018-08-22: qty 1

## 2018-08-22 MED ORDER — DIPHENHYDRAMINE HCL 50 MG/ML IJ SOLN
50.0000 mg | Freq: Once | INTRAMUSCULAR | Status: AC
  Administered 2018-08-22: 50 mg via INTRAMUSCULAR
  Filled 2018-08-22: qty 1
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
## 2018-08-22 NOTE — ED Notes (Signed)
Pt reports that she is still anxious at this time. Waiting for pt to become less anxious before discharge.

## 2018-08-22 NOTE — ED Notes (Signed)
[facility] Expected date:  Expected time:  Means of arrival:  Comments: EMS panic attack

## 2018-08-22 NOTE — Discharge Instructions (Addendum)
Please return for any problem.  Follow-up with your regular care provider as instructed. °

## 2018-08-22 NOTE — ED Triage Notes (Signed)
[facility] [place] [facility] [employer] [facility] [employer], panic/anxiety attack while taking exam. Hx of anxiety, does not take meds for same.  BP 132/80 HR 110 RR 24 Sp02 99 RA

## 2018-08-22 NOTE — ED Notes (Signed)
ED Provider at bedside. 

## 2018-08-22 NOTE — ED Notes (Signed)
Pt made aware that she needs to find a ride before she receives any other medication.

## 2018-09-16 ENCOUNTER — Encounter (HOSPITAL_COMMUNITY): Payer: Self-pay

## 2018-09-16 ENCOUNTER — Emergency Department (HOSPITAL_COMMUNITY): Payer: Self-pay

## 2018-09-16 ENCOUNTER — Other Ambulatory Visit: Payer: Self-pay

## 2018-09-16 ENCOUNTER — Emergency Department (HOSPITAL_COMMUNITY)
Admission: EM | Admit: 2018-09-16 | Discharge: 2018-09-16 | Disposition: A | Discharge status: Home / Self Care | Payer: Self-pay | Attending: Emergency Medicine | Admitting: Emergency Medicine

## 2018-09-16 DIAGNOSIS — N939 Abnormal uterine and vaginal bleeding, unspecified: Secondary | ICD-10-CM | POA: Insufficient documentation

## 2018-09-16 DIAGNOSIS — F17290 Nicotine dependence, other tobacco product, uncomplicated: Secondary | ICD-10-CM | POA: Insufficient documentation

## 2018-09-16 DIAGNOSIS — Z91040 Latex allergy status: Secondary | ICD-10-CM | POA: Insufficient documentation

## 2018-09-16 DIAGNOSIS — I252 Old myocardial infarction: Secondary | ICD-10-CM | POA: Insufficient documentation

## 2018-09-16 DIAGNOSIS — N83201 Unspecified ovarian cyst, right side: Secondary | ICD-10-CM | POA: Insufficient documentation

## 2018-09-16 DIAGNOSIS — R1031 Right lower quadrant pain: Secondary | ICD-10-CM

## 2018-09-16 DIAGNOSIS — Z79899 Other long term (current) drug therapy: Secondary | ICD-10-CM | POA: Insufficient documentation

## 2018-09-16 DIAGNOSIS — E119 Type 2 diabetes mellitus without complications: Secondary | ICD-10-CM | POA: Insufficient documentation

## 2018-09-16 LAB — COMPREHENSIVE METABOLIC PANEL
ALT: 15 U/L (ref 0–44)
ANION GAP: 7 (ref 5–15)
AST: 15 U/L (ref 15–41)
Albumin: 3.6 g/dL (ref 3.5–5.0)
Alkaline Phosphatase: 67 U/L (ref 38–126)
BUN: 10 mg/dL (ref 6–20)
CHLORIDE: 107 mmol/L (ref 98–111)
CO2: 23 mmol/L (ref 22–32)
CREATININE: 0.55 mg/dL (ref 0.44–1.00)
Calcium: 9.1 mg/dL (ref 8.9–10.3)
GFR calc Af Amer: >60 mL/min (ref >60)
GFR calc non Af Amer: >60 mL/min (ref >60)
Glucose, Bld: 95 mg/dL (ref 70–99)
POTASSIUM: 4.1 mmol/L (ref 3.5–5.1)
SODIUM: 137 mmol/L (ref 135–145)
Total Bilirubin: 0.3 mg/dL (ref 0.3–1.2)
Total Protein: 7.1 g/dL (ref 6.5–8.1)

## 2018-09-16 LAB — LIPASE, BLOOD: Lipase: 28 U/L (ref 11–51)

## 2018-09-16 LAB — URINALYSIS, ROUTINE W REFLEX MICROSCOPIC
BILIRUBIN URINE: NEGATIVE
Bacteria, UA: NONE SEEN
GLUCOSE, UA: NEGATIVE mg/dL
Ketones, ur: NEGATIVE mg/dL
LEUKOCYTES UA: NEGATIVE
NITRITE: NEGATIVE
PH: 7 (ref 5.0–8.0)
Protein, ur: NEGATIVE mg/dL
Specific Gravity, Urine: 1.024 (ref 1.005–1.030)

## 2018-09-16 LAB — CBC WITH DIFFERENTIAL/PLATELET
ABS IMMATURE GRANULOCYTES: 0.05 10*3/uL (ref 0.00–0.07)
BASOS PCT: 0 %
Basophils Absolute: 0 10*3/uL (ref 0.0–0.1)
Eosinophils Absolute: 0.1 10*3/uL (ref 0.0–0.5)
Eosinophils Relative: 1 %
HCT: 41.6 % (ref 36.0–46.0)
Hemoglobin: 12.6 g/dL (ref 12.0–15.0)
Immature Granulocytes: 1 %
Lymphocytes Relative: 31 %
Lymphs Abs: 2.5 10*3/uL (ref 0.7–4.0)
MCH: 23.6 pg — ABNORMAL LOW (ref 26.0–34.0)
MCHC: 30.3 g/dL (ref 30.0–36.0)
MCV: 77.8 fL — ABNORMAL LOW (ref 80.0–100.0)
MONO ABS: 0.8 10*3/uL (ref 0.1–1.0)
Monocytes Relative: 9 %
NEUTROS ABS: 4.7 10*3/uL (ref 1.7–7.7)
NEUTROS PCT: 58 %
PLATELETS: 304 10*3/uL (ref 150–400)
RBC: 5.35 MIL/uL — AB (ref 3.87–5.11)
RDW: 15.3 % (ref 11.5–15.5)
WBC: 8.1 10*3/uL (ref 4.0–10.5)
nRBC: 0 % (ref 0.0–0.2)

## 2018-09-16 LAB — WET PREP, GENITAL
Clue Cells Wet Prep HPF POC: NONE SEEN
SPERM: NONE SEEN
Trich, Wet Prep: NONE SEEN
Yeast Wet Prep HPF POC: NONE SEEN

## 2018-09-16 LAB — I-STAT CG4 LACTIC ACID, ED: LACTIC ACID, VENOUS: 1.19 mmol/L (ref 0.5–1.9)

## 2018-09-16 MED ORDER — MORPHINE SULFATE (PF) 4 MG/ML IV SOLN
4.0000 mg | Freq: Once | INTRAVENOUS | Status: AC
  Administered 2018-09-16: 4 mg via INTRAVENOUS
  Filled 2018-09-16: qty 1

## 2018-09-16 MED ORDER — OXYCODONE-ACETAMINOPHEN 5-325 MG PO TABS: 1.0000 | ORAL_TABLET | ORAL | 0 refills | Status: AC | PRN

## 2018-09-16 MED ORDER — SODIUM CHLORIDE 0.9 % IV BOLUS
1000.0000 mL | Freq: Once | INTRAVENOUS | Status: AC
  Administered 2018-09-16: 1000 mL via INTRAVENOUS

## 2018-09-16 MED ORDER — ONDANSETRON HCL 4 MG PO TABS: 4.0000 mg | ORAL_TABLET | Freq: Three times a day (TID) | ORAL | 0 refills | Status: AC | PRN

## 2018-09-16 MED ORDER — KETOROLAC TROMETHAMINE 30 MG/ML IJ SOLN
30.0000 mg | Freq: Once | INTRAMUSCULAR | Status: AC
  Administered 2018-09-16: 30 mg via INTRAVENOUS
  Filled 2018-09-16: qty 1

## 2018-09-16 MED ORDER — ONDANSETRON HCL 4 MG/2ML IJ SOLN
4.0000 mg | Freq: Once | INTRAMUSCULAR | Status: AC
  Administered 2018-09-16: 4 mg via INTRAVENOUS
  Filled 2018-09-16: qty 2

## 2018-09-16 NOTE — Discharge Instructions (Signed)
Your work-up and ultrasound today was most consistent with an ovarian cyst causing the pain and symptoms.  The OB/GYN team looked at the ultrasound I did not feel there was evidence of torsion.  They requested we discharged home with pain medication and nausea medicine and follow-up with them in clinic.  Please do so.  Please stay hydrated.  Please rest.  If any symptoms change or worsen, please return to the nearest emergency department.

## 2018-09-16 NOTE — ED Provider Notes (Signed)
[facility] EMERGENCY DEPARTMENT Provider Note   CSN: [id number] Arrival date & time: 09/16/18  0744     History   Chief Complaint Chief Complaint  Patient presents with  . Abdominal Pain    HPI Raya Terriquez is a 22 y.o. female.  The history is provided by the patient and a significant other. No language interpreter was used.  Abdominal Pain   This is a recurrent problem. The current episode started 2 days ago. The problem occurs constantly. The problem has not changed since onset.The pain is associated with an unknown factor. The pain is located in the suprapubic region and RLQ. The quality of the pain is sharp and aching. The pain is at a severity of 10/10. The pain is severe. Associated symptoms include nausea. Pertinent negatives include fever, diarrhea, melena, vomiting, constipation, dysuria, frequency, hematuria and headaches. The symptoms are aggravated by palpation. Nothing relieves the symptoms.    Past Medical History:  Diagnosis Date  . Anxiety    not taking meds  . Bilateral ovarian cysts   . Diabetes mellitus without complication (HCC)    prediabetic  . GI bleed due to NSAIDs   . Myocardial infarction (HCC)    2017   . Pseudoseizures   . Seizures (HCC)    pseudoseizures    Patient Active Problem List   Diagnosis Date Noted  . Headache   . UGI bleed 07/07/2016  . History of diabetes mellitus 07/07/2016  . Morbid obesity (HCC) 07/07/2016  . Hematemesis with nausea 07/07/2016  . GI bleed 07/07/2016    Past Surgical History:  Procedure Laterality Date  . ESOPHAGOGASTRODUODENOSCOPY N/A 07/07/2016   Procedure: ESOPHAGOGASTRODUODENOSCOPY (EGD);  Surgeon: Ruffin Frederick, MD;  Location: [facility] ENDOSCOPY;  Service: Gastroenterology;  Laterality: N/A;  . HEMORROIDECTOMY       OB History    Gravida  1   Para  0   Term  0   Preterm  0   AB  1   Living  0     SAB  1   TAB  0   Ectopic  0   Multiple  0   Live Births   0            Home Medications    Prior to Admission medications   Medication Sig Start Date End Date Taking? Authorizing Provider  etonogestrel (NEXPLANON) 68 MG IMPL implant 1 each by Subdermal route continuous. Placed 05/2015    [provider]  HYDROcodone-acetaminophen (NORCO) 5-325 MG tablet Take 1-2 tablets by mouth every 6 (six) hours as needed. 07/05/18   Geoffery Lyons, MD  ibuprofen (ADVIL,MOTRIN) 800 MG tablet Take 1 tablet (800 mg total) by mouth every 8 (eight) hours as needed for moderate pain. 07/04/18   Long, Arlyss Repress, MD  omeprazole (PRILOSEC) 40 MG capsule Take 40 mg by mouth 2 (two) times daily as needed (for reflux).  06/08/17   [provider]  ondansetron (ZOFRAN ODT) 8 MG disintegrating tablet 8mg  ODT q4 hours prn nausea Patient not taking: Reported on 05/15/2018 05/05/18   Muthersbaugh, Dahlia Client, PA-C  ranitidine (ZANTAC) 150 MG tablet Take 1 tablet (150 mg total) by mouth 2 (two) times daily. 05/15/18   Luevenia Maxin, Mina A, PA-C  sucralfate (CARAFATE) 1 g tablet Take 1 tablet (1 g total) by mouth 4 (four) times daily -  with meals and at bedtime. Patient not taking: Reported on 06/02/2018 05/15/18   Jeanie Sewer, PA-C    Family  History Family History  Problem Relation Age of Onset  . Hyperlipidemia Mother   . Mental illness Mother   . Hyperlipidemia Father   . Mental illness Brother   . Heart disease Paternal Grandfather   . Hyperlipidemia Paternal Grandfather   . Hypertension Paternal Grandfather   . CAD Other   . Cancer Other     Social History Social History   Tobacco Use  . Smoking status: Current Some Day Smoker    Packs/day: 0.25    Types: Cigars  . Smokeless tobacco: Never Used  Substance Use Topics  . Alcohol use: Yes    Comment: social  . Drug use: Yes    Types: Marijuana    Comment: yesterday at 1500     Allergies   Bentyl [dicyclomine hcl]; Haldol [haloperidol lactate]; Toradol [ketorolac tromethamine]; Tramadol;  Chocolate; and Latex   Review of Systems Review of Systems  Constitutional: Negative for chills, diaphoresis, fatigue and fever.  HENT: Negative for congestion.   Eyes: Negative for visual disturbance.  Respiratory: Negative for cough, chest tightness, shortness of breath, wheezing and stridor.   Cardiovascular: Negative for chest pain.  Gastrointestinal: Positive for abdominal pain and nausea. Negative for constipation, diarrhea, melena and vomiting.  Genitourinary: Positive for pelvic pain, vaginal bleeding and vaginal pain. Negative for dysuria, flank pain, frequency, genital sores, hematuria and vaginal discharge.  Musculoskeletal: Negative for back pain, neck pain and neck stiffness.  Skin: Negative for rash and wound.  Neurological: Negative for light-headedness and headaches.  Psychiatric/Behavioral: Negative for agitation.     Physical Exam Updated Vital Signs BP 137/82 (BP Location: Right Arm)   Pulse 88   Temp 98.4 F (36.9 C) (Oral)   Resp 18   Ht 5\' 5"  (1.651 m)   Wt 110.2 kg   LMP 09/16/2018 (Exact Date)   SpO2 98%   BMI 40.44 kg/m   Physical Exam Vitals signs and nursing note reviewed.  Constitutional:      General: She is not in acute distress.    Appearance: She is well-developed.  HENT:     Head: Normocephalic and atraumatic.  Eyes:     Conjunctiva/sclera: Conjunctivae normal.  Neck:     Musculoskeletal: Neck supple.  Cardiovascular:     Rate and Rhythm: Normal rate and regular rhythm.     Heart sounds: No murmur.  Pulmonary:     Effort: Pulmonary effort is normal. No respiratory distress.     Breath sounds: Normal breath sounds.  Abdominal:     General: Abdomen is flat. Bowel sounds are normal.     Palpations: Abdomen is soft.     Tenderness: There is abdominal tenderness in the right lower quadrant. There is no right CVA tenderness, left CVA tenderness, guarding or rebound.    Genitourinary:    Vagina: Bleeding present. No erythema or  tenderness.     Cervix: No cervical motion tenderness, discharge or friability.     Uterus: Not tender.      Adnexa:        Right: Tenderness present.        Left: No tenderness.    Skin:    General: Skin is warm and dry.  Neurological:     Mental Status: She is alert.      ED Treatments / Results  Labs (all labs ordered are listed, but only abnormal results are displayed) Labs Reviewed  WET PREP, GENITAL - Abnormal; Notable for the following components:      Result Value  WBC, Wet Prep HPF POC MODERATE (*)    All other components within normal limits  CBC WITH DIFFERENTIAL/PLATELET - Abnormal; Notable for the following components:   RBC 5.35 (*)    MCV 77.8 (*)    MCH 23.6 (*)    All other components within normal limits  URINALYSIS, ROUTINE W REFLEX MICROSCOPIC - Abnormal; Notable for the following components:   Hgb urine dipstick LARGE (*)    RBC / HPF >50 (*)    All other components within normal limits  URINE CULTURE  COMPREHENSIVE METABOLIC PANEL  LIPASE, BLOOD  I-STAT CG4 LACTIC ACID, ED  POC URINE PREG, ED  WET PREP  (BD AFFIRM) ([place])  GC/CHLAMYDIA PROBE AMP ([place]) [facility]    EKG None  Radiology [place] Transvaginal Non-ob  Result Date: 09/16/2018 CLINICAL DATA:  Right-sided pelvic pain and vaginal bleeding. History of ovarian cysts and uterine fibroids. Evaluate for ovarian torsion. EXAM: TRANSABDOMINAL AND TRANSVAGINAL ULTRASOUND OF PELVIS DOPPLER ULTRASOUND OF OVARIES TECHNIQUE: Both transabdominal and transvaginal ultrasound examinations of the pelvis were performed. Transabdominal technique was performed for global imaging of the pelvis including uterus, ovaries, adnexal regions, and pelvic cul-de-sac. It was necessary to proceed with endovaginal exam following the transabdominal exam to visualize the uterus, endometrium, ovaries, and adnexa. Color and duplex Doppler ultrasound was utilized to evaluate blood flow to the ovaries. COMPARISON:   Pelvic ultrasound dated July 05, 2018. FINDINGS: Uterus Measurements: 8.1 x 3.5 x 4.5 cm = volume: 67 mL. No fibroids or other mass visualized. Endometrium Thickness: 8 mm.  No focal abnormality visualized. Right ovary Measurements: 4.6 x 2.6 x 4.0 cm = volume: 25 mL. The right ovary is very heterogeneous with a complex appearance centrally, measuring 4.0 x 2.1 x 3.2 cm. Left ovary Measurements: 1.9 x 1.4 x 2.9 cm = volume: 4 mL. Normal appearance/no adnexal mass. Pulsed Doppler evaluation of both ovaries demonstrates normal low-resistance arterial and venous waveforms. Other findings Small amount of free fluid adjacent to the right ovary. IMPRESSION: 1. Heterogeneous, mildly enlarged right ovary with a 4.0 cm complex appearance centrally that may reflect a hemorrhagic cyst. Intermittent torsion is also a possibility, however on the current exam there is normal flow to the right ovary. Close clinical follow-up is recommended and if the patient's symptoms do not improve over the next 24-48 hours, repeat pelvic ultrasound is recommended. Electronically Signed   By: Obie DredgeWilliam T Derry M.D.   On: 09/16/2018 11:50   [place] Pelvis Complete  Result Date: 09/16/2018 CLINICAL DATA:  Right-sided pelvic pain and vaginal bleeding. History of ovarian cysts and uterine fibroids. Evaluate for ovarian torsion. EXAM: TRANSABDOMINAL AND TRANSVAGINAL ULTRASOUND OF PELVIS DOPPLER ULTRASOUND OF OVARIES TECHNIQUE: Both transabdominal and transvaginal ultrasound examinations of the pelvis were performed. Transabdominal technique was performed for global imaging of the pelvis including uterus, ovaries, adnexal regions, and pelvic cul-de-sac. It was necessary to proceed with endovaginal exam following the transabdominal exam to visualize the uterus, endometrium, ovaries, and adnexa. Color and duplex Doppler ultrasound was utilized to evaluate blood flow to the ovaries. COMPARISON:  Pelvic ultrasound dated July 05, 2018. FINDINGS: Uterus  Measurements: 8.1 x 3.5 x 4.5 cm = volume: 67 mL. No fibroids or other mass visualized. Endometrium Thickness: 8 mm.  No focal abnormality visualized. Right ovary Measurements: 4.6 x 2.6 x 4.0 cm = volume: 25 mL. The right ovary is very heterogeneous with a complex appearance centrally, measuring 4.0 x 2.1 x 3.2 cm. Left ovary Measurements: 1.9 x  1.4 x 2.9 cm = volume: 4 mL. Normal appearance/no adnexal mass. Pulsed Doppler evaluation of both ovaries demonstrates normal low-resistance arterial and venous waveforms. Other findings Small amount of free fluid adjacent to the right ovary. IMPRESSION: 1. Heterogeneous, mildly enlarged right ovary with a 4.0 cm complex appearance centrally that may reflect a hemorrhagic cyst. Intermittent torsion is also a possibility, however on the current exam there is normal flow to the right ovary. Close clinical follow-up is recommended and if the patient's symptoms do not improve over the next 24-48 hours, repeat pelvic ultrasound is recommended. Electronically Signed   By: Obie DredgeWilliam T Derry M.D.   On: 09/16/2018 11:50   [place] Art/ven Flow Abd Pelv Doppler  Result Date: 09/16/2018 CLINICAL DATA:  Right-sided pelvic pain and vaginal bleeding. History of ovarian cysts and uterine fibroids. Evaluate for ovarian torsion. EXAM: TRANSABDOMINAL AND TRANSVAGINAL ULTRASOUND OF PELVIS DOPPLER ULTRASOUND OF OVARIES TECHNIQUE: Both transabdominal and transvaginal ultrasound examinations of the pelvis were performed. Transabdominal technique was performed for global imaging of the pelvis including uterus, ovaries, adnexal regions, and pelvic cul-de-sac. It was necessary to proceed with endovaginal exam following the transabdominal exam to visualize the uterus, endometrium, ovaries, and adnexa. Color and duplex Doppler ultrasound was utilized to evaluate blood flow to the ovaries. COMPARISON:  Pelvic ultrasound dated July 05, 2018. FINDINGS: Uterus Measurements: 8.1 x 3.5 x 4.5 cm = volume:  67 mL. No fibroids or other mass visualized. Endometrium Thickness: 8 mm.  No focal abnormality visualized. Right ovary Measurements: 4.6 x 2.6 x 4.0 cm = volume: 25 mL. The right ovary is very heterogeneous with a complex appearance centrally, measuring 4.0 x 2.1 x 3.2 cm. Left ovary Measurements: 1.9 x 1.4 x 2.9 cm = volume: 4 mL. Normal appearance/no adnexal mass. Pulsed Doppler evaluation of both ovaries demonstrates normal low-resistance arterial and venous waveforms. Other findings Small amount of free fluid adjacent to the right ovary. IMPRESSION: 1. Heterogeneous, mildly enlarged right ovary with a 4.0 cm complex appearance centrally that may reflect a hemorrhagic cyst. Intermittent torsion is also a possibility, however on the current exam there is normal flow to the right ovary. Close clinical follow-up is recommended and if the patient's symptoms do not improve over the next 24-48 hours, repeat pelvic ultrasound is recommended. Electronically Signed   By: Obie DredgeWilliam T Derry M.D.   On: 09/16/2018 11:50    Procedures Procedures (including critical care time)  Medications Ordered in ED Medications  morphine 4 MG/ML injection 4 mg (4 mg Intravenous Given 09/16/18 0923)  ondansetron (ZOFRAN) injection 4 mg (4 mg Intravenous Given 09/16/18 0923)  sodium chloride 0.9 % bolus 1,000 mL (0 mLs Intravenous Stopped 09/16/18 1023)  morphine 4 MG/ML injection 4 mg (4 mg Intravenous Given 09/16/18 1303)  morphine 4 MG/ML injection 4 mg (4 mg Intravenous Given 09/16/18 1406)  ondansetron (ZOFRAN) injection 4 mg (4 mg Intravenous Given 09/16/18 1406)  ketorolac (TORADOL) 30 MG/ML injection 30 mg (30 mg Intravenous Given 09/16/18 1406)     Initial Impression / Assessment and Plan / ED Course  I have reviewed the triage vital signs and the nursing notes.  Pertinent labs & imaging results that were available during my care of the patient were reviewed by me and considered in my medical decision making (see  chart for details).     Julyanna Karpowicz is a 22 y.o. female with a past medical history significant for GI bleed, diabetes, obesity, pseudoseizures, anxiety, bilateral ovarian cyst, and prior uterine fibroids who  presents with lower pelvic pain, a pop feeling, vaginal bleeding, nausea and vomiting.  Patient reports that the last 2 days she has had pelvic cramping and discomfort.  She reports that last night she felt a pop on her right pelvic/right lower quadrant and then had vaginal bleeding after.  She reports the bleeding has decreased and is now a very small amount.  She reports intermittent menstrual cycles due to implanted birth control.  She reports that this does feel somewhat similar to when she had a cyst rupture in the past.  She reports the pain is severe but she is also had nausea and vomiting.  She reports that she has been lightheaded after the vomiting.  She denies any other chest pain, shortness of breath, congestion or cough.  She does report some chills.  No urinary symptoms.  No constipation or diarrhea.  No trauma.  No other complaints on arrival.  On exam, patient has tenderness in the lower abdomen.  Patient had recent work-up within the last 3 months including CT ultrasound that was reassuring in regards to appendix and bowel.  Patient did have ultrasounds that time that confirmed ovarian cysts.  Patient's lungs were clear and chest was nontender.  Patient was lightheaded when she stood up and was guided back to her bed.  Clinically suspect patient has a ruptured ovarian cyst causing her symptoms.  Patient had pelvic exam with swabs.  She will have pelvic ultrasound.  She will given pain medicine, nausea medicine and fluids and will be reassessed.  Anticipate discharge if symptoms are improved, patient is feeling better after rehydration, and we rule out torsion.  Pelvic ultrasound showed evidence of hemorrhagic cyst however they could not rule out torsion based on appearance.   There was normal blood flow on ultrasound.  OB/GYN was called who looked at the ultrasound results and images.  They did not feel this was torsion and did not feel she needed transfer for evaluation.  They suspect this is a hemorrhagic cyst causing her symptoms.  They recommended Toradol and outpatient follow-up with pain medication.  Patient was given Toradol with significant relief in pain.  Pain significantly improved.  Other work-up was reassuring, doubt torsion at this time.  Patient had no evidence of BV on wet prep.  GC and chlamydia was sent.  Patient given prescription for pain medicine and nausea medicine and will follow-up with OB/GYN and PCP.  She understood return precautions and was discharged in good condition.   Final Clinical Impressions(s) / ED Diagnoses   Final diagnoses:  Hemorrhagic cyst of right ovary  Right lower quadrant abdominal pain  Vaginal bleeding    ED Discharge Orders    None     Clinical Impression: 1. Hemorrhagic cyst of right ovary   2. Right lower quadrant abdominal pain   3. Vaginal bleeding     Disposition: Discharge  Condition: Good  I have discussed the results, Dx and Tx plan with the pt(& family if present). He/she/they expressed understanding and agree(s) with the plan. Discharge instructions discussed at great length. Strict return precautions discussed and pt &/or family have verbalized understanding of the instructions. No further questions at time of discharge.    Discharge Medication List as of 09/16/2018  3:04 PM    START taking these medications   Details  ondansetron (ZOFRAN) 4 MG tablet Take 1 tablet (4 mg total) by mouth every 8 (eight) hours as needed for nausea or vomiting., Starting Tue 09/16/2018, Print  oxyCODONE-acetaminophen (PERCOCET/ROXICET) 5-325 MG tablet Take 1 tablet by mouth every 4 (four) hours as needed for severe pain., Starting Tue 09/16/2018, Print        Follow Up: Center, [facility] [address]  [place] [address] [phone]     [facility] [address] [place] [address] [phone] Schedule an appointment as soon as possible for a visit    [facility] EMERGENCY DEPARTMENT [address] [id number] mc [place] [address] [phone]       Tegeler, Canary Brim, MD 09/16/18 [phone]

## 2018-09-16 NOTE — ED Triage Notes (Signed)
Pt reports pelvic cramping for 2 days, last night pt reports feeling a "pop" on her right side and started to vaginally bleed after that.  Pt presents today with continued abd cramping that is sharp and stabbing.

## 2018-09-17 LAB — URINE CULTURE

## 2018-09-19 LAB — GC/CHLAMYDIA PROBE AMP (~~LOC~~) NOT AT ARMC
CHLAMYDIA, DNA PROBE: NEGATIVE
Neisseria Gonorrhea: NEGATIVE

## 2019-09-06 IMAGING — CT CT ABD-PELV W/ CM
2 of 4 series · 17 of 46 positions shown, 19 images · IV contrast (ISOVUE)
Comparison: Ultrasound pelvis 05/05/2018. CT abdomen and pelvis
09/17/2017

CLINICAL DATA: Left lower quadrant abdominal pain. History of
ovarian cysts. Seen yesterday at [facility] for same.

EXAM:
CT ABDOMEN AND PELVIS WITH CONTRAST
TECHNIQUE: Multidetector CT imaging of the abdomen and pelvis was performed
using the standard protocol following bolus administration of
intravenous contrast.
CONTRAST:  100mL ROXSWQ-W88 IOPAMIDOL (ROXSWQ-W88) INJECTION 61%

[Series 2: axial st · axial · 0.77mm/px · z∈[-444,-24]mm · 14 of 94 slices shown, 16 images]
[im 5/94  soft-tissue]
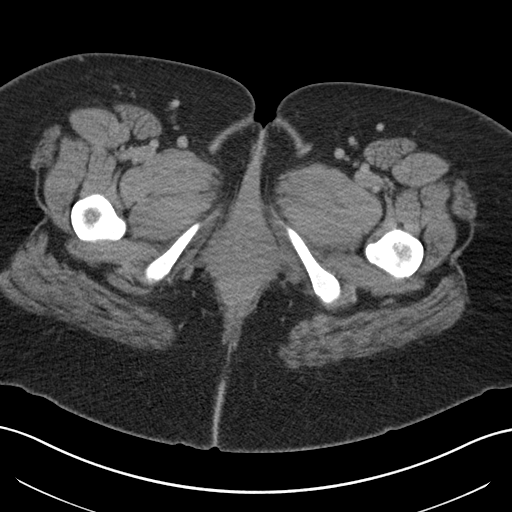
[im 5/94  bone]
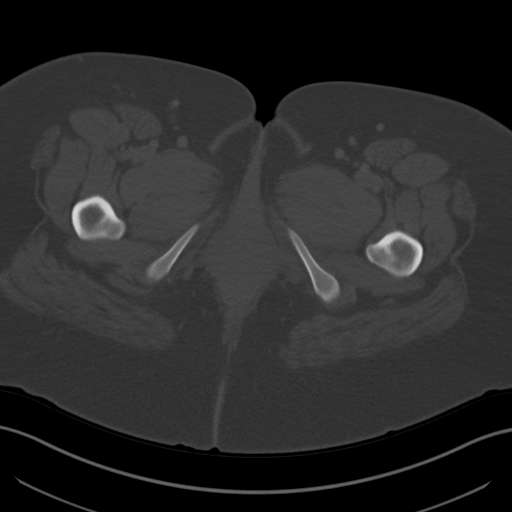
[im 10/94  soft-tissue]
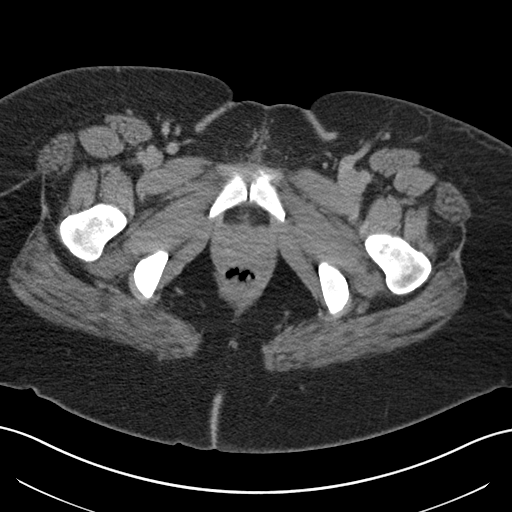
[im 20/94  soft-tissue]
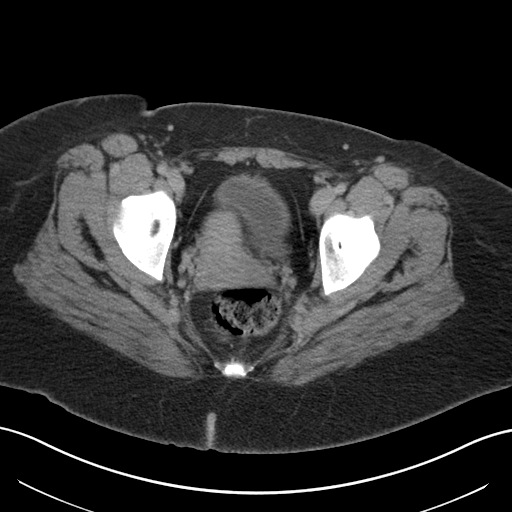
[im 25/94  soft-tissue]
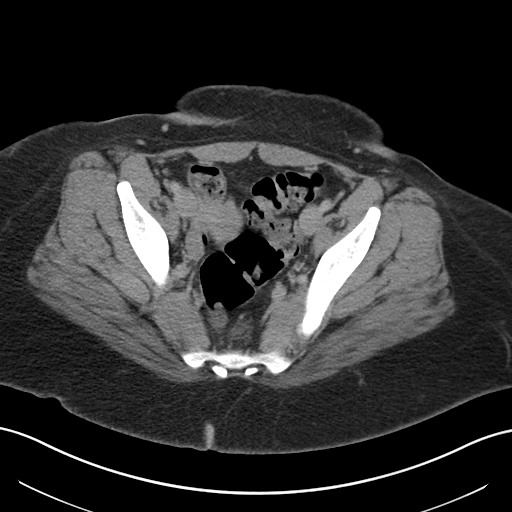
[im 30/94  soft-tissue]
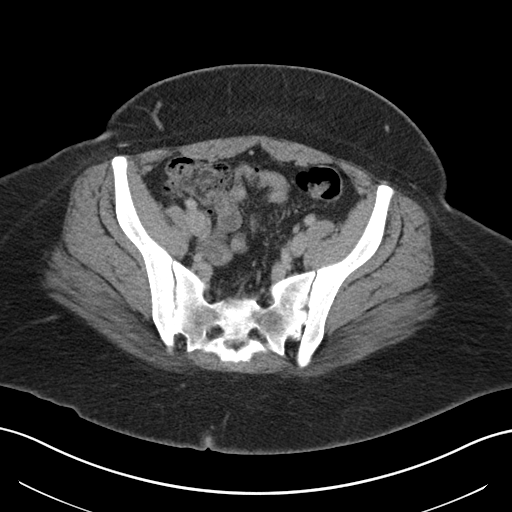
[im 40/94  soft-tissue]
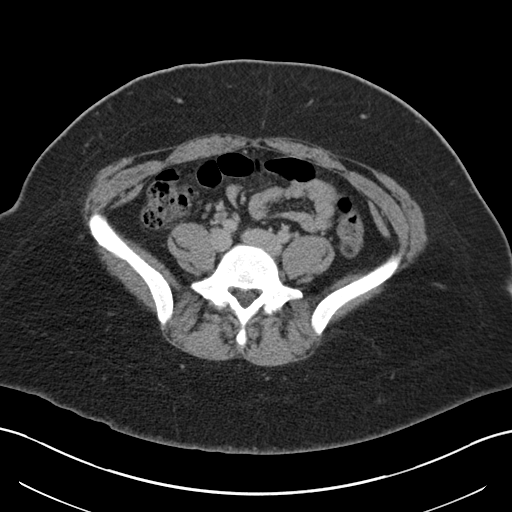
[im 45/94  soft-tissue]
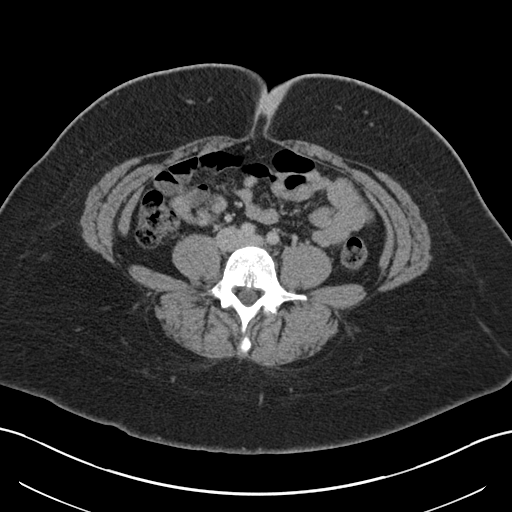
[im 49/94  soft-tissue]
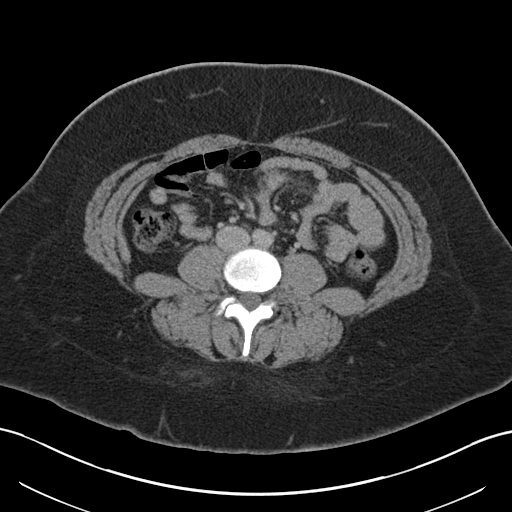
[im 54/94  soft-tissue]
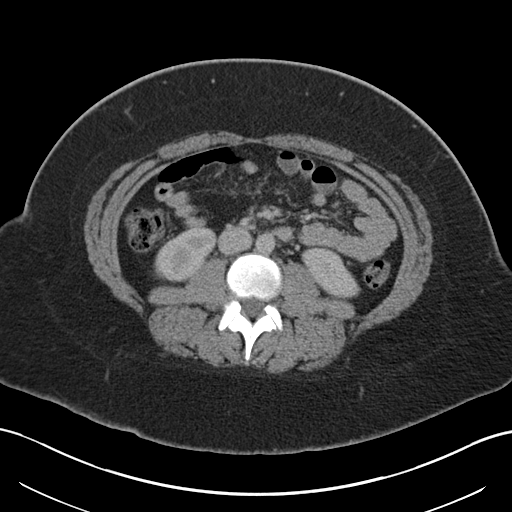
[im 54/94  bone]
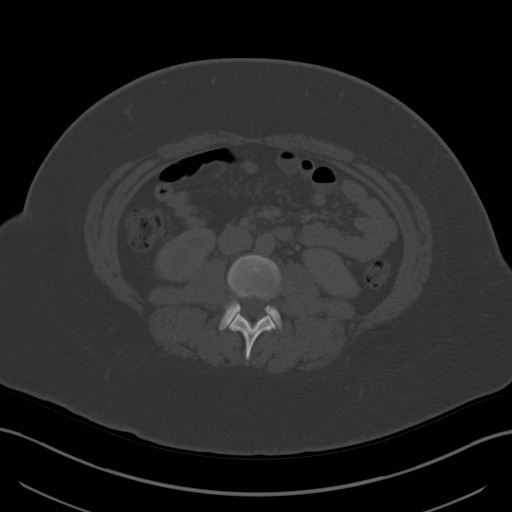
[im 64/94  soft-tissue]
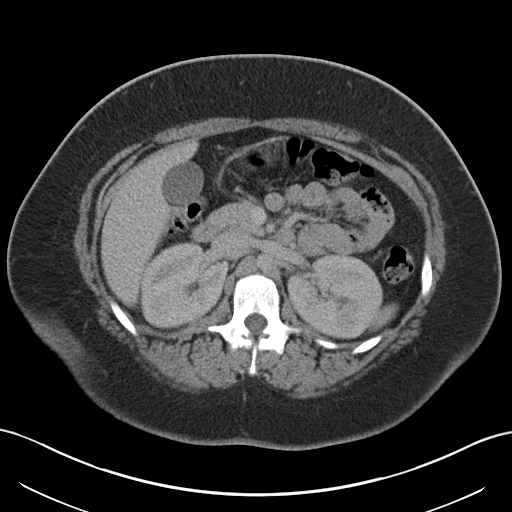
[im 69/94  soft-tissue]
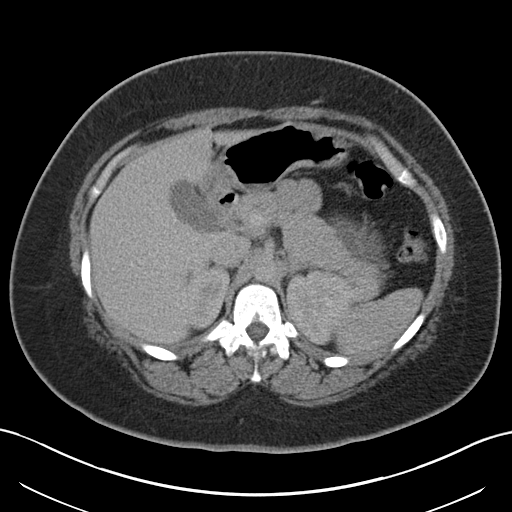
[im 74/94  soft-tissue]
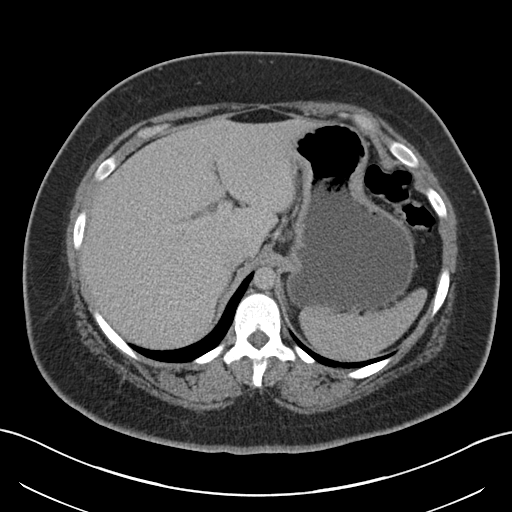
[im 84/94  soft-tissue]
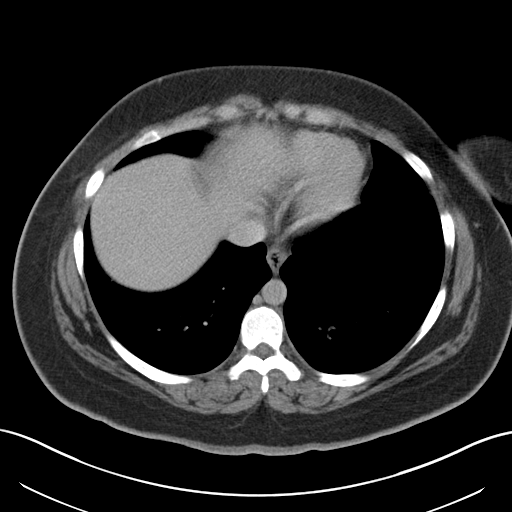
[im 89/94  soft-tissue]
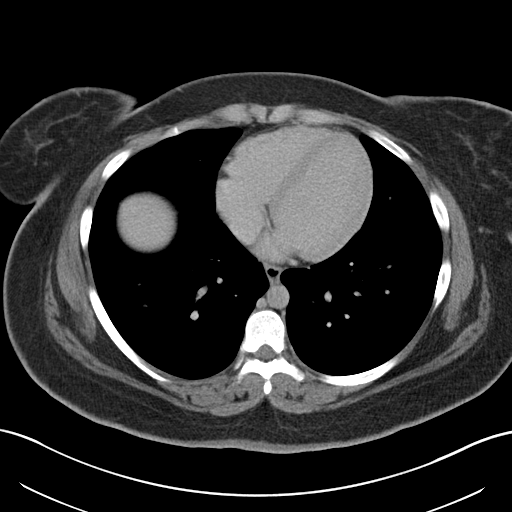

[Series 5: coronal st · coronal · 0.86mm/px · 3 of 101 slices shown]
[im 34/101  soft-tissue]
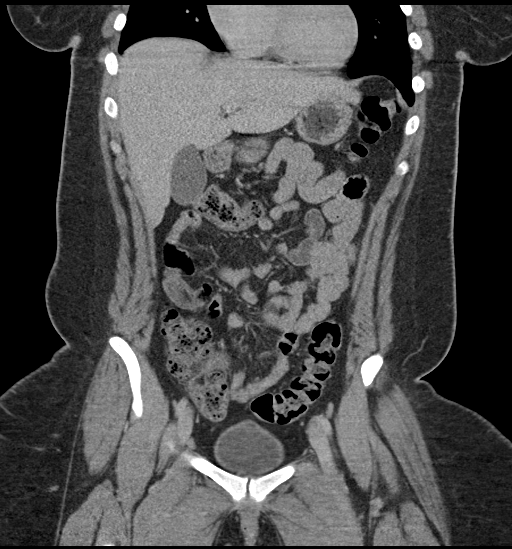
[im 45/101  soft-tissue]
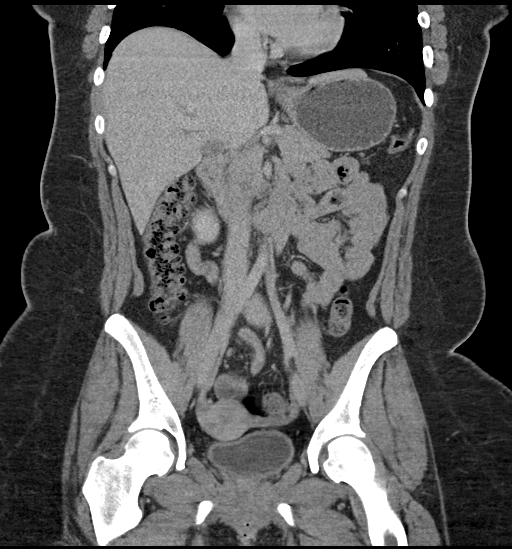
[im 56/101  soft-tissue]
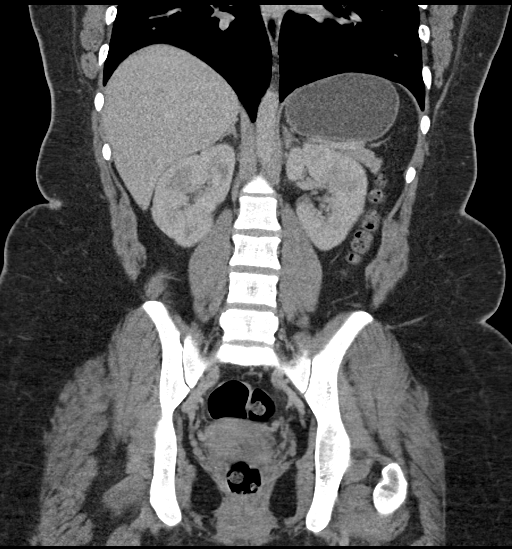

[17 of 46 positions shown; findings below may reference images not displayed]

FINDINGS: Lower chest: Lung bases are clear.

Hepatobiliary: No focal liver abnormality is seen. No gallstones,
gallbladder wall thickening, or biliary dilatation.

Pancreas: Unremarkable. No pancreatic ductal dilatation or
surrounding inflammatory changes.

Spleen: Normal in size without focal abnormality.

Adrenals/Urinary Tract: Adrenal glands are unremarkable. Kidneys are
normal, without renal calculi, focal lesion, or hydronephrosis.
Bladder is unremarkable.

Stomach/Bowel: Stomach is within normal limits. Appendix appears
normal. No evidence of bowel wall thickening, distention, or
inflammatory changes.

Vascular/Lymphatic: No significant vascular findings are present. No
enlarged abdominal or pelvic lymph nodes.

Reproductive: Uterus and bilateral adnexa are unremarkable.

Other: No abdominal wall hernia or abnormality. No abdominopelvic
ascites.

Musculoskeletal: No acute or significant osseous findings.
IMPRESSION: No significant abnormality.

## 2019-09-06 IMAGING — US US TRANSVAGINAL NON-OB
1 series · 13 of 25 positions shown · non-contrast
Comparison: Prior ultrasound from 08/03/2017.

CLINICAL DATA: Initial evaluation for acute left lower quadrant
pain, vaginal bleeding.

EXAM:
TRANSABDOMINAL AND TRANSVAGINAL ULTRASOUND OF PELVIS
DOPPLER ULTRASOUND OF OVARIES
TECHNIQUE: Both transabdominal and transvaginal ultrasound examinations of the
pelvis were performed. Transabdominal technique was performed for
global imaging of the pelvis including uterus, ovaries, adnexal
regions, and pelvic cul-de-sac.
It was necessary to proceed with endovaginal exam following the
transabdominal exam to visualize the uterus, endometrium, and
ovaries. Color and duplex Doppler ultrasound was utilized to
evaluate blood flow to the ovaries.

[Series 1: us transvaginal non-ob · 13 of 109 slices shown]
[im 1/109]
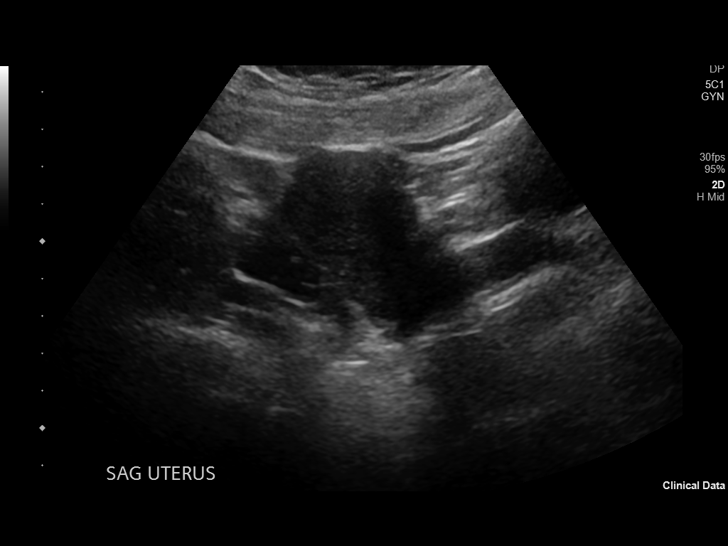
[im 10/109]
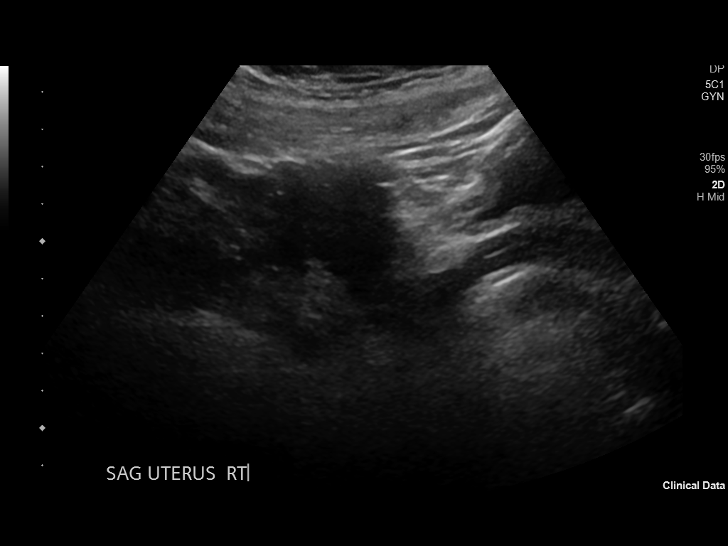
[im 19/109]
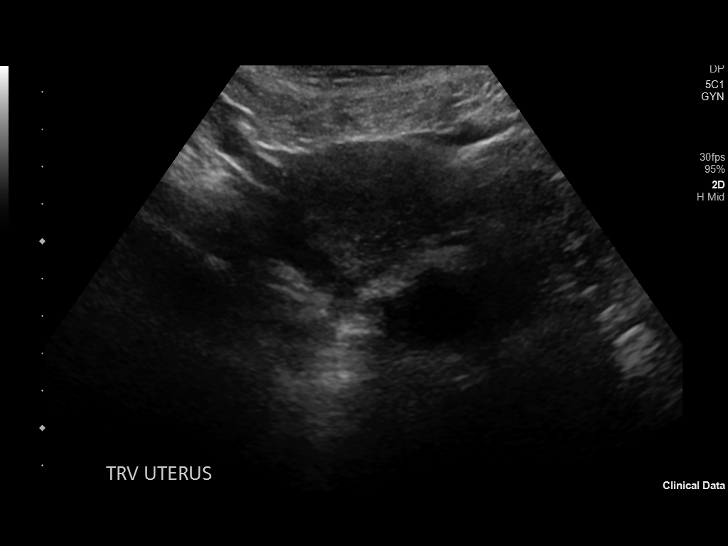
[im 28/109]
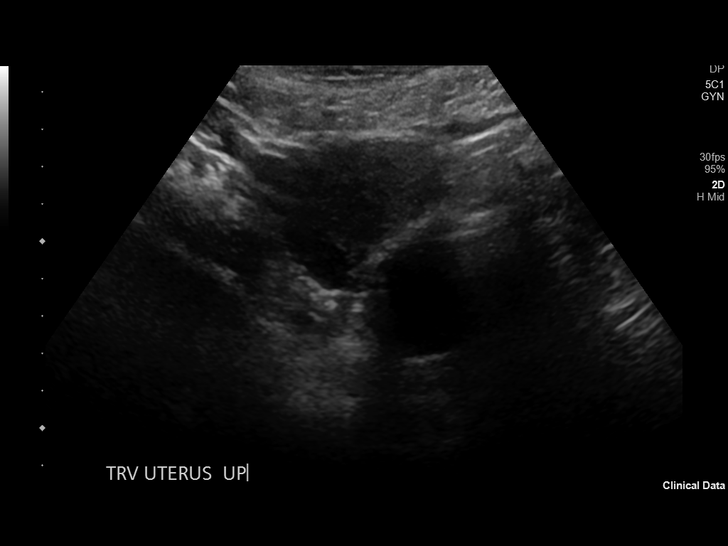
[im 37/109]
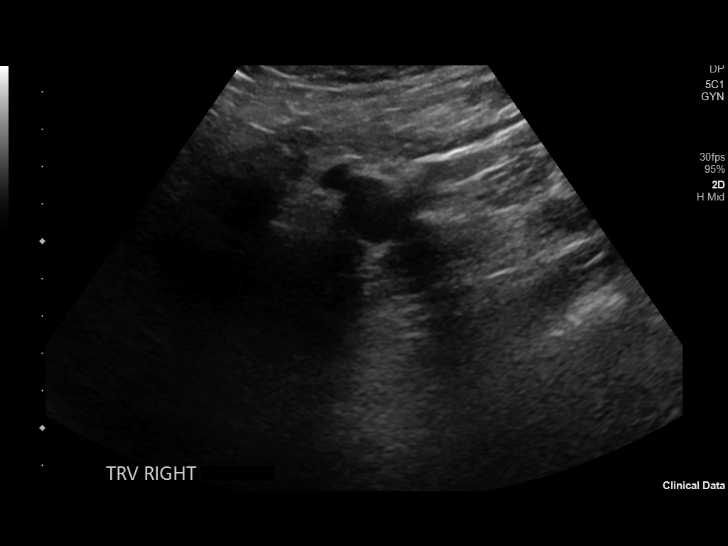
[im 46/109]
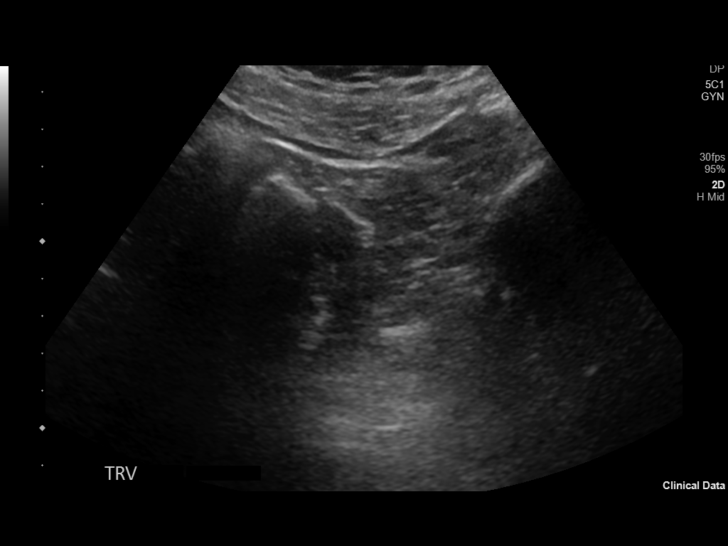
[im 55/109]
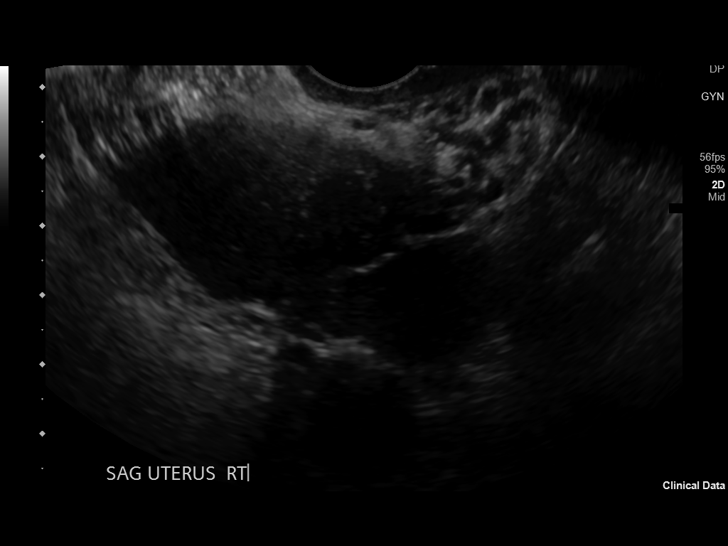
[im 64/109]
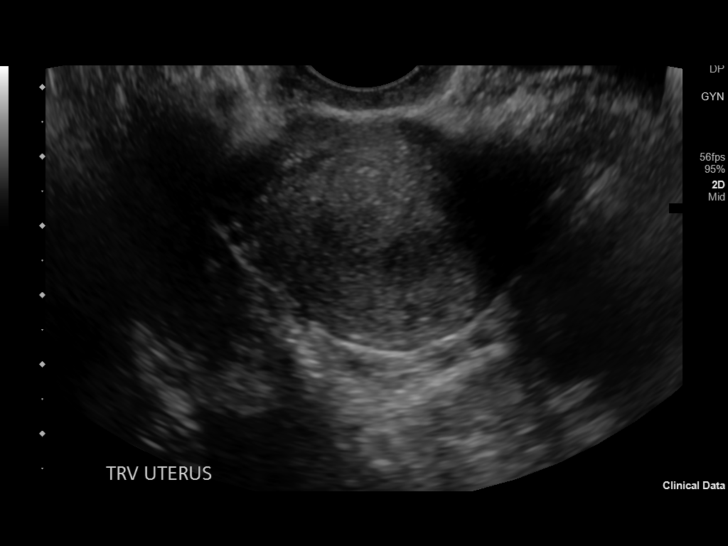
[im 73/109]
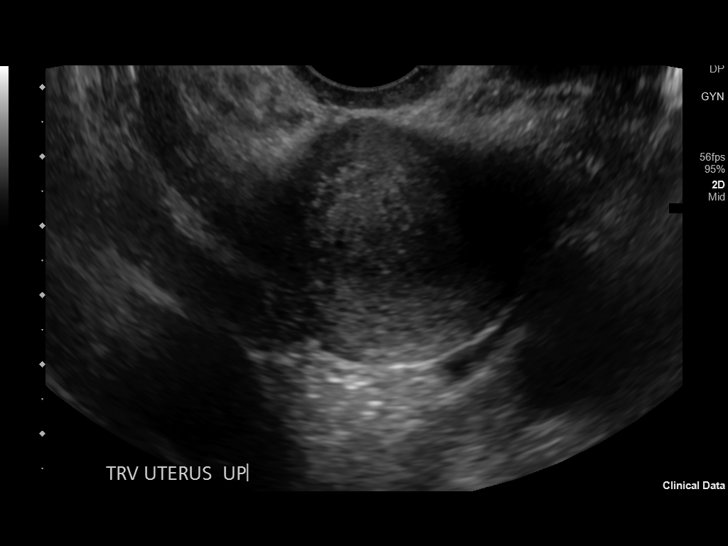
[im 82/109]
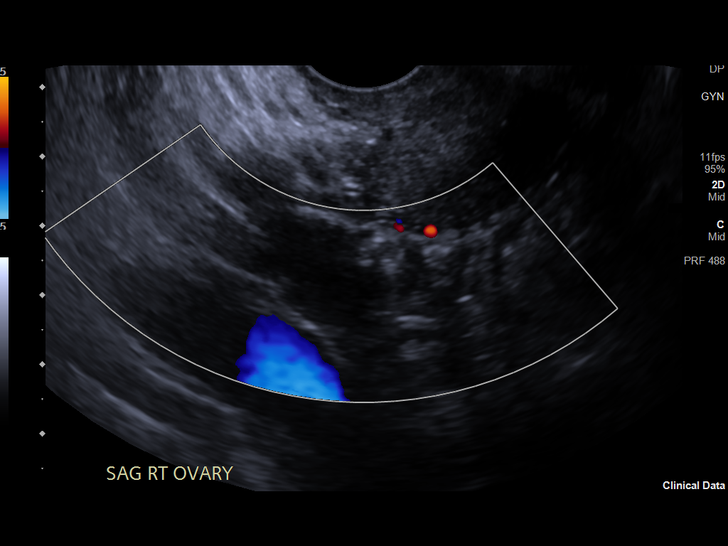
[im 91/109]
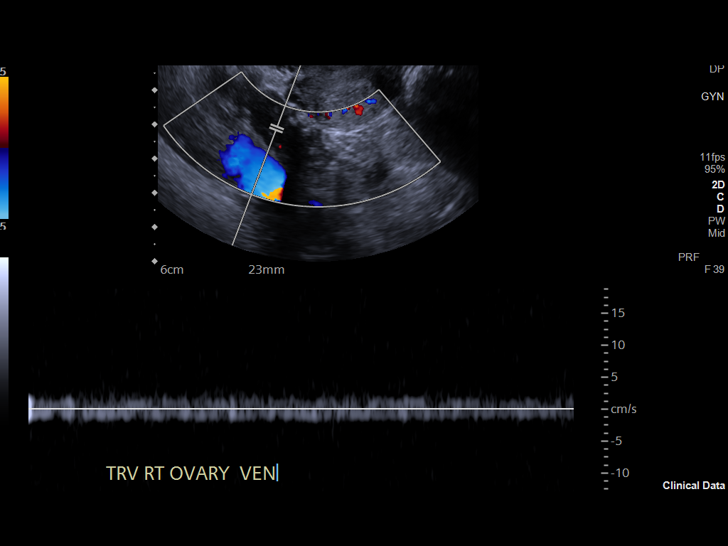
[im 100/109]
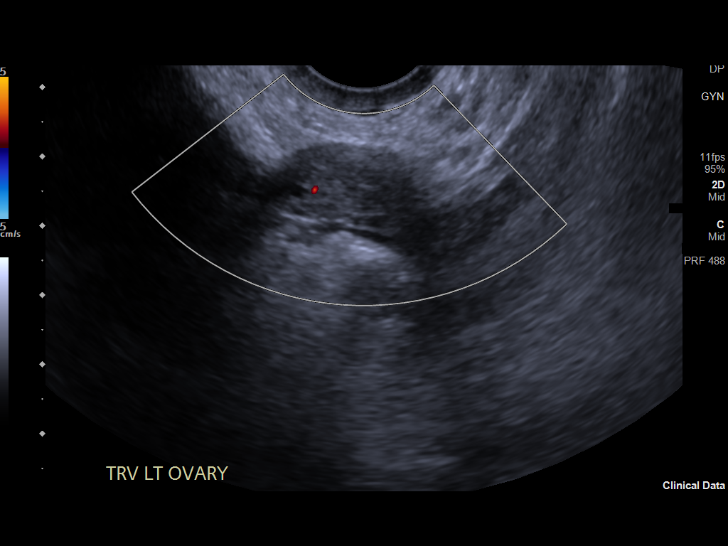
[im 109/109]
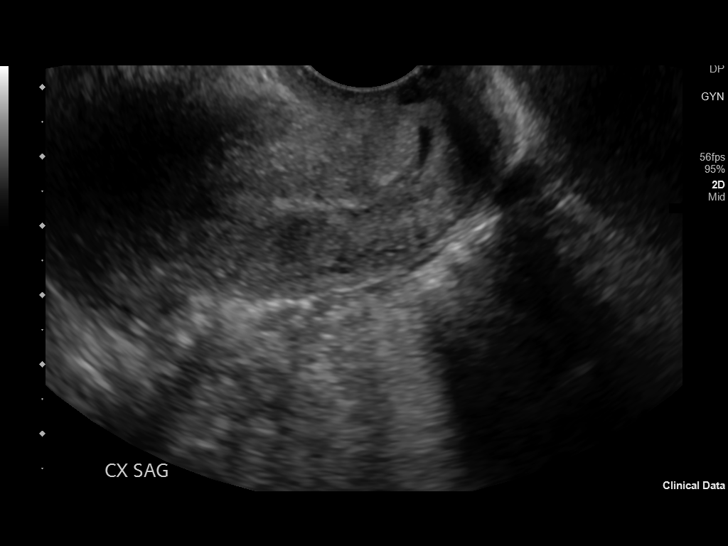

[13 of 25 positions shown; findings below may reference images not displayed]

FINDINGS: Uterus

Measurements: 5.7 x 3.4 x 4.6 cm. No fibroids or other mass
visualized.

Endometrium

Thickness: 5.0 mm.  No focal abnormality visualized.

Right ovary

Measurements: 2.6 x 1.4 x 1.9 cm. Normal appearance/no adnexal mass.

Left ovary

Measurements: 3.1 x 1.7 x 1.9 cm. Normal appearance/no adnexal mass.

Pulsed Doppler evaluation of both ovaries demonstrates normal
low-resistance arterial and venous waveforms.

Other findings

No abnormal free fluid.
IMPRESSION: Normal pelvic ultrasound. No evidence for torsion or other acute
abnormality.
